# Patient Record
Sex: Male | Born: 1945 | Race: White | Hispanic: No | Marital: Single | Smoking: Never smoker
Health system: Southern US, Community
[De-identification: ages and names within clinical notes are randomized; demographics above are authoritative.]

## PROBLEM LIST (undated history)

## (undated) DIAGNOSIS — E669 Obesity, unspecified: Secondary | ICD-10-CM

## (undated) DIAGNOSIS — K219 Gastro-esophageal reflux disease without esophagitis: Secondary | ICD-10-CM

## (undated) DIAGNOSIS — N189 Chronic kidney disease, unspecified: Secondary | ICD-10-CM

## (undated) DIAGNOSIS — Z860100 Personal history of colon polyps, unspecified: Secondary | ICD-10-CM

## (undated) DIAGNOSIS — K5792 Diverticulitis of intestine, part unspecified, without perforation or abscess without bleeding: Secondary | ICD-10-CM

## (undated) DIAGNOSIS — I10 Essential (primary) hypertension: Secondary | ICD-10-CM

## (undated) DIAGNOSIS — I2510 Atherosclerotic heart disease of native coronary artery without angina pectoris: Secondary | ICD-10-CM

## (undated) DIAGNOSIS — N289 Disorder of kidney and ureter, unspecified: Secondary | ICD-10-CM

## (undated) DIAGNOSIS — K279 Peptic ulcer, site unspecified, unspecified as acute or chronic, without hemorrhage or perforation: Secondary | ICD-10-CM

## (undated) HISTORY — DX: Peptic ulcer, site unspecified, unspecified as acute or chronic, without hemorrhage or perforation: K27.9

## (undated) HISTORY — DX: Personal history of colonic polyps: Z86.010

## (undated) HISTORY — DX: Essential (primary) hypertension: I10

## (undated) HISTORY — DX: Personal history of colon polyps, unspecified: Z86.0100

## (undated) HISTORY — DX: Atherosclerotic heart disease of native coronary artery without angina pectoris: I25.10

## (undated) HISTORY — DX: Obesity, unspecified: E66.9

## (undated) HISTORY — DX: Diverticulitis of intestine, part unspecified, without perforation or abscess without bleeding: K57.92

## (undated) HISTORY — DX: Gastro-esophageal reflux disease without esophagitis: K21.9

## (undated) HISTORY — DX: Chronic kidney disease, unspecified: N18.9

## (undated) HISTORY — DX: Disorder of kidney and ureter, unspecified: N28.9

---

## 1998-02-22 ENCOUNTER — Other Ambulatory Visit: Admission: RE | Admit: 1998-02-22 | Discharge: 1998-02-22 | Payer: Self-pay | Admitting: Gastroenterology

## 1999-08-14 ENCOUNTER — Encounter: Payer: Self-pay | Admitting: Cardiothoracic Surgery

## 1999-08-15 ENCOUNTER — Encounter: Payer: Self-pay | Admitting: Cardiothoracic Surgery

## 1999-08-15 ENCOUNTER — Inpatient Hospital Stay (HOSPITAL_COMMUNITY): Admission: AD | Admit: 1999-08-15 | Discharge: 1999-08-19 | Payer: Self-pay | Admitting: Cardiology

## 1999-08-16 ENCOUNTER — Encounter: Payer: Self-pay | Admitting: Cardiothoracic Surgery

## 1999-08-16 HISTORY — PX: BYPASS GRAFT: SHX909

## 1999-08-17 ENCOUNTER — Encounter: Payer: Self-pay | Admitting: Cardiothoracic Surgery

## 1999-08-18 ENCOUNTER — Encounter: Payer: Self-pay | Admitting: Cardiothoracic Surgery

## 1999-11-06 ENCOUNTER — Encounter (HOSPITAL_COMMUNITY): Admission: RE | Admit: 1999-11-06 | Discharge: 2000-02-04 | Payer: Self-pay | Admitting: Cardiology

## 2003-08-18 ENCOUNTER — Ambulatory Visit (HOSPITAL_COMMUNITY): Admission: RE | Admit: 2003-08-18 | Discharge: 2003-08-18 | Payer: Self-pay | Admitting: Gastroenterology

## 2003-08-22 ENCOUNTER — Ambulatory Visit (HOSPITAL_COMMUNITY): Admission: RE | Admit: 2003-08-22 | Discharge: 2003-08-22 | Payer: Self-pay | Admitting: Family Medicine

## 2003-08-26 ENCOUNTER — Encounter: Admission: RE | Admit: 2003-08-26 | Discharge: 2003-08-26 | Payer: Self-pay | Admitting: Gastroenterology

## 2005-08-06 HISTORY — PX: CARDIAC CATHETERIZATION: SHX172

## 2005-08-21 ENCOUNTER — Encounter: Admission: RE | Admit: 2005-08-21 | Discharge: 2005-08-21 | Payer: Self-pay | Admitting: Family Medicine

## 2005-08-30 ENCOUNTER — Inpatient Hospital Stay (HOSPITAL_BASED_OUTPATIENT_CLINIC_OR_DEPARTMENT_OTHER): Admission: RE | Admit: 2005-08-30 | Discharge: 2005-08-30 | Payer: Self-pay | Admitting: Cardiology

## 2006-02-11 ENCOUNTER — Ambulatory Visit: Payer: Self-pay | Admitting: Family Medicine

## 2006-04-08 HISTORY — PX: NASAL ENDOSCOPY: SHX286

## 2006-12-11 ENCOUNTER — Ambulatory Visit: Payer: Self-pay | Admitting: Family Medicine

## 2006-12-15 ENCOUNTER — Ambulatory Visit (HOSPITAL_COMMUNITY): Admission: RE | Admit: 2006-12-15 | Discharge: 2006-12-15 | Payer: Self-pay | Admitting: Family Medicine

## 2006-12-23 ENCOUNTER — Ambulatory Visit: Payer: Self-pay | Admitting: Family Medicine

## 2007-01-01 ENCOUNTER — Encounter: Admission: RE | Admit: 2007-01-01 | Discharge: 2007-01-01 | Payer: Self-pay | Admitting: Gastroenterology

## 2007-01-09 ENCOUNTER — Ambulatory Visit (HOSPITAL_COMMUNITY): Admission: RE | Admit: 2007-01-09 | Discharge: 2007-01-09 | Payer: Self-pay | Admitting: Gastroenterology

## 2007-02-26 ENCOUNTER — Encounter: Admission: RE | Admit: 2007-02-26 | Discharge: 2007-02-26 | Payer: Self-pay | Admitting: General Surgery

## 2007-03-20 ENCOUNTER — Ambulatory Visit: Payer: Self-pay | Admitting: Family Medicine

## 2007-09-23 ENCOUNTER — Ambulatory Visit: Payer: Self-pay | Admitting: Family Medicine

## 2007-10-06 ENCOUNTER — Encounter: Admission: RE | Admit: 2007-10-06 | Discharge: 2007-10-06 | Payer: Self-pay | Admitting: Family Medicine

## 2007-10-06 ENCOUNTER — Ambulatory Visit: Payer: Self-pay | Admitting: Family Medicine

## 2007-10-14 ENCOUNTER — Ambulatory Visit: Payer: Self-pay | Admitting: Family Medicine

## 2007-12-31 ENCOUNTER — Ambulatory Visit: Payer: Self-pay | Admitting: Family Medicine

## 2008-02-19 ENCOUNTER — Ambulatory Visit: Payer: Self-pay | Admitting: Family Medicine

## 2008-08-31 ENCOUNTER — Encounter (INDEPENDENT_AMBULATORY_CARE_PROVIDER_SITE_OTHER): Payer: Self-pay | Admitting: *Deleted

## 2008-10-12 ENCOUNTER — Ambulatory Visit: Payer: Self-pay | Admitting: Family Medicine

## 2008-11-15 ENCOUNTER — Ambulatory Visit: Payer: Self-pay | Admitting: Family Medicine

## 2009-01-31 ENCOUNTER — Ambulatory Visit: Payer: Self-pay | Admitting: Family Medicine

## 2009-02-20 ENCOUNTER — Ambulatory Visit: Payer: Self-pay | Admitting: Family Medicine

## 2009-03-27 ENCOUNTER — Ambulatory Visit: Payer: Self-pay | Admitting: Family Medicine

## 2009-06-06 ENCOUNTER — Telehealth: Payer: Self-pay | Admitting: Gastroenterology

## 2010-04-30 ENCOUNTER — Encounter: Payer: Self-pay | Admitting: Family Medicine

## 2010-05-08 NOTE — Letter (Signed)
Summary: Recall Colonoscopy Letter  [facility]  [address] [place], [place] [address]   Phone: [phone]  Fax: [phone]      Aug 31, 2008 MRN: [id number]   ZESHAN SENA [address] [place], [place]  [address]   Dear Mr. Laba,   According to your medical record, it is time for you to schedule a Colonoscopy. The American Cancer Society recommends this procedure as a method to detect early colon cancer. Patients with a family history of colon cancer, or a personal history of colon polyps or inflammatory bowel disease are at increased risk.  This letter has beeen generated based on the recommendations made at the time of your procedure. If you feel that in your particular situation this may no longer apply, please contact our office.  Please call our office at [phone] to schedule this appointment or to update your records at your earliest convenience.  Thank you for cooperating with [place] to provide you with the very best care possible.   Sincerely,  Barbette Hair. Arlyce Dice, M.D.  [facility] [phone]

## 2010-05-08 NOTE — Progress Notes (Signed)
Summary: Schedule Colonoscopy  Phone Note Outgoing Call Call back at [facility] Phone [phone]   Call placed by: Harlow Mares CMA Duncan Dull),  June 06, 2009 4:45 PM Call placed to: Patient Summary of Call: Left message on patients machine to call back.  patient needs colonoscopy Initial call taken by: Harlow Mares CMA Duncan Dull),  June 06, 2009 4:46 PM  Follow-up for Phone Call        Left message on patients machine to call back.  Follow-up by: Harlow Mares CMA Duncan Dull),  June 15, 2009 2:54 PM

## 2010-06-12 ENCOUNTER — Encounter (INDEPENDENT_AMBULATORY_CARE_PROVIDER_SITE_OTHER): Payer: 59 | Admitting: Family Medicine

## 2010-06-12 DIAGNOSIS — E785 Hyperlipidemia, unspecified: Secondary | ICD-10-CM

## 2010-06-12 DIAGNOSIS — M109 Gout, unspecified: Secondary | ICD-10-CM

## 2010-06-12 DIAGNOSIS — I2510 Atherosclerotic heart disease of native coronary artery without angina pectoris: Secondary | ICD-10-CM

## 2010-06-12 DIAGNOSIS — Z79899 Other long term (current) drug therapy: Secondary | ICD-10-CM

## 2010-08-24 NOTE — H&P (Signed)
NAME:  Chase Wright, Chase Wright NO.:  [id number]   MEDICAL RECORD NO.:  [id number]           PATIENT TYPE:   LOCATION:                                 FACILITY:   PHYSICIAN:  Peter M. Swaziland, M.D.       DATE OF BIRTH:   DATE OF ADMISSION:  08/30/2005  DATE OF DISCHARGE:                                HISTORY & PHYSICAL   Chase Wright is a 65 year old white male with known history of coronary  artery disease.  He is status post coronary bypass surgery x5 in May 2001 by  Dr. Tyrone Sage.  At that time he had a LIMA graft placed to the LAD, saphenous  vein graft to an intermediate branch, saphenous vein graft to obtuse  marginal branch, and a sequential saphenous vein graft to the PDA and  posterolateral branches of the right coronary.  He has done very well from a  cardiac standpoint since then.  Approximately 2 months ago he began having  increased urinary frequency and nocturia.  He was placed on Flomax.  He also  had a change in his lipid therapy from Zocor to Vytorin since his LDL was  still proximally 161.  Over the past 2 weeks he has been having increasing  symptoms of right upper parasternal chest pain described as a heavy pressure  sensation.  There is no radiation, although he states that sometimes it  feels like it is coming out of his throat.  He has had associated shortness  of breath.  He denies any nausea, vomiting or diaphoresis.  He has not tried  taking any nitroglycerin.  He has been on chronic therapy for acid reflux  disease and states his pain is much different than his acid reflux symptoms,  and is more similar to his prior anginal attacks.  Given the fact that he is  now 6 years post bypass surgery and having ongoing increasing chest pain, it  is recommended he undergo diagnostic cardiac catheterization.   PAST MEDICAL HISTORY:  1.  Hypercholesterolemia.  2.  Hypertension.  3.  Gout.  4.  Obesity.  5.  History of renal calculi.  6.  Status post  T&A.  7.  Status post CABG x5 in 2001.   He is allergic to PENICILLIN.  He does have chronic renal insufficiency with  baseline creatinine 1.7.   His current medications include:  1.  Toprol-XL 200 mg per day.  2.  Altace 10 mg per day.  3.  Aspirin 325 mg per day.  4.  Vytorin 10/40 mg per day.  5.  Hydrochlorothiazide 25 mg per day.  6.  Allopurinol 300 mg per day.  7.  Flomax 0.4 mg daily.  8.  Norvasc 10 mg per day.  9.  Pepcid 20 mg b.i.d.   SOCIAL HISTORY:  The patient works at [employer].  He is single.  He  has no children.  He has 17 dogs that he cares for.  He denies tobacco use.  He does drink occasional gin and tonic.   FAMILY HISTORY:  His father died age  85 of prostate cancer.  Mother died at  age 75 of old age.  She did have diabetes.  He has no siblings.   REVIEW OF SYSTEMS:  He has had some increased lower extremity edema.  He has  had increased urinary frequency without dysuria.  He denies any recent  change in bowel or bladder habits.  He has had no history of TIA or stroke  and no bleeding disorder.  Other review of systems are negative.   PHYSICAL EXAMINATION:  GENERAL:  The patient is an obese white male in no  distress.  VITAL SIGNS:  His weight is 242.  His blood pressure is 140/88, pulse is 60  and regular.  HEENT:  Normocephalic, atraumatic.  Pupils are equal, round, and reactive.  Extraocular movements are full.  Oropharynx is clear.  NECK:  Supple without JVD, adenopathy, thyromegaly or bruits.  LUNGS:  Clear.  CARDIAC:  Reveals regular rate and rhythm without gallop, murmur, rub or  click.  His PMI is nondisplaced.  ABDOMEN:  Obese, soft, nontender, without masses or bruits.  Cannot elicit  any chest pain to palpation of the epigastric or chest wall.  EXTREMITIES:  Femoral and pedal pulses are 2+ and symmetric.  He has 1+  pitting edema.  He has no evidence of phlebitis.  NEUROLOGIC:  Exam is nonfocal.   LABORATORY DATA:  Resting ECG is  normal.  His chest x-ray showed mild  cardiomegaly with no active disease.  Most recent renal panel showed a BUN  35, creatinine 1.7.  Electrolytes were normal.   IMPRESSION:  1.  New onset of chest pain concerning for recurrent angina.  2.  Coronary disease status post coronary artery bypass grafting May 2001.  3.  Chronic renal sufficiency.  4.  Hypertension.  5.  Hypercholesterolemia.  6.  Gout.   PLAN:  The patient will hold his hydrochlorothiazide in preparation for his  catheterization.  Will pretreat him with Mucomyst to try and protect his  kidneys.  He will undergo diagnostic cardiac catheterization on Aug 30, 2005.  Further therapy pending these results.           ______________________________  Peter M. Swaziland, M.D.     PMJ/MEDQ  D:  08/28/2005  T:  08/28/2005  Job:  [id number]   cc:   Sharlot Gowda, M.D.  Fax: [phone]

## 2010-08-24 NOTE — Op Note (Signed)
[name]  Patient:    Chase Wright, Chase Wright                      MRN: [id number] Proc. Date: 08/15/99 Adm. Date:  [id number] Attending:  Waldo Laine CC:         Gwenith Daily. Tyrone Sage, M.D.             Peter M. Swaziland, M.D.                           Operative Report  PREOPERATIVE DIAGNOSIS:  Coronary occlusive disease with unstable angina.  POSTOPERATIVE DIAGNOSIS:  Coronary occlusive disease with unstable angina.  SURGICAL PROCEDURE:  Coronary artery bypass grafting x 5 with the left internal mammary to the left anterior descending coronary artery, sequential reverse saphenous vein grafts to the posterior descending and posterior lateral coronary arteries, reverse saphenous vein graft to the intermediate coronary artery, and a reverse saphenous vein graft to the first obtuse marginal coronary artery.  SURGEON:  Gwenith Daily. Tyrone Sage, M.D.  FIRST ASSISTANT:  Eugenia Pancoast, P.A.  BRIEF HISTORY:  The patient is a 65 year old male with long-standing, severe hypertension with blood pressures recently noted to be as high as 240/120.  He presented to Dr. Peter M. Swaziland with a two month history of progressive anginal symptoms with the recent onset of anginal chest pain at rest and with minimal activity.  Because of these symptoms, a cardiac catheterization was performed, which demonstrated 80% stenosis of the proximal LAD, 80% stenosis of the intermediate coronary artery, subtotal occlusion of a first obtuse marginal coronary artery, 80% stenosis of the right coronary artery, and 60-70% stenosis of the posterolateral branch of the right coronary artery. Overall, ventricular function was with ejection fraction of approximately 50%. Coronary artery bypass grafting was recommended to the patient, who agreed, and signed informed consent.  DESCRIPTION OF PROCEDURE:  With Swan-Ganz and arterial line monitors in place, the patient underwent general  endotracheal anesthesia without incidence.  The skin of the chest and legs were prepped with Betadine and draped in the usual sterile manner.  A vein was harvested from the right lower extremity and was of adequate quality and caliber.  A median sternotomy was performed.  The left internal mammary artery was dissected down as a pedicle graft.  The distal artery was divided.  It had good free flow.  The pericardium was opened.  The patient had evidence of severe left ventricular hypertrophy, probably based on long-standing hypertension.  He was systemically heparinized.  The ascending aorta and right atrium were cannulated, and the aortic root vent cardioplegia needle was introduced into the ascending aorta.  The patient was placed on cardiopulmonary bypass 2.4 L/min/sq m.  The sites of the anastomoses were selected and dissected out of the epicardium.  The patients body temperature was cooled to 30 degrees.  An aortic Cross clamp was applied, and 500 cc of cold blood potassium cardioplegia was administered with rapid diastolic arrest to the heart.  Myocardial septal temperature was monitored throughout the Cross clamp.  Attention was turned first to the obtuse marginal coronary artery, which was subtotally occluded at the time the film, was a reasonably sized vessel, identified, opened, and admitted a 1.5 mm probe.  Using a running 7-0 Prolene, a distal anastomosis was performed.  Attention was then turned to the right coronary system.  The posterior descending and posterolateral branches of the right  coronary artery were each opened.  Using a diamond-type, side-to-side, the anastomosis was carried out with a segment of reverse saphenous vein graft to the posterior descending coronary artery. The distal extent of the same vein was then anastomosed to the posterolateral branch.  Attention was then turned to the intermediate coronary artery, which was opened, and it admitted a 1.5 mm probe.   Using a running 7-0 Prolene, a distal anastomosis was performed with a segment of reverse saphenous vein graft.  Attention was then turned to the left anterior descending coronary artery, which was opened between the mid and distal third.  Using a running 8-0 Prolene, the left internal mammary artery was anastomosed the left anterior descending coronary artery.  With release of Edwards bulldog on the mammary artery, there was appropriate rise in myocardial septal temperature. The aortic Cross clamp was removed with a total Cross clamp time of 78 minutes.  A partial occlusion clamp was placed on the ascending aorta. Three punch aortotomies were performed.  Each of the three vein grafts were anastomosed to the ascending aorta.  Air was evacuated from the grafts, and the partial occlusion clamp was removed.  Sites of anastomoses were inspected and were free of bleeding.  The patient was then ventilated and weaned from cardiopulmonary bypass without difficulty.  He remained hemodynamically stable, and he was decannulated in the usual fashion.  Protamine sulfate was administered.  With the operative field homeostatic, two atrial and two ventricular pacing wires were applied.  Graft marks were applied, a left pleural tube, and two mediastinal tubes were left in place.  The sternum was closed with #6 stainless steel wire.  The fascia closed with interrupted #0 Vicryl, running 3-0 Vicryl in the subcutaneous tissue, and a 4-0 subcuticular stitch in the skin edges.  Dry dressings were applied. Sponge and needle counts were reported as correct at the completion of the procedure.  The patient tolerated the procedure without obvious complication and was transferred to the surgical intensive care unit for further postoperative care. DD:  08/17/99 TD:  08/19/99 Job: [id number]

## 2010-08-24 NOTE — Cardiovascular Report (Signed)
NAME:  Chase Wright, Chase Wright NO.:  [id number]   MEDICAL RECORD NO.:  [id number]          PATIENT TYPE:  OIB   LOCATION:  NA                           FACILITY:  [facility]   PHYSICIAN:  Peter M. Swaziland, M.D.  DATE OF BIRTH:  09/20/1945   DATE OF PROCEDURE:  08/30/2005  DATE OF DISCHARGE:                              CARDIAC CATHETERIZATION   INDICATION FOR PROCEDURE:  A 65 year old white male history of severe  hypertension, hypercholesterolemia, and known coronary disease.  He is  status post coronary artery bypass surgery in 2001.  He presents at this  time with increasing anginal symptoms.   PROCEDURES:  1.  Left heart catheterization.  2.  Coronary and left ventricular angiography.  3.  Saphenous vein graft angiography x3.  4.  Left internal mammary artery graft angiography.   ACCESS:  Via the right femoral artery using standard Seldinger technique.   EQUIPMENT USED:  A 4-French 4 cm right and left Judkins catheter, 4-French  pigtail catheter, 4-French arterial sheath.   CONTRAST:  100 mL of Omnipaque.   MEDICATIONS:  Local anesthesia 1% Xylocaine, Versed 2 mg IV.   HEMODYNAMIC DATA:  Aortic pressure is 103/61 with a mean of 80 mmHg.  Left  ventricle pressure 103 with an EDP of 22 mmHg.   ANGIOGRAPHIC DATA:  The left coronary arises and distributes normally.  The  left main coronary artery is without significant disease.   The left anterior descending artery is occluded proximally.   The intermediate branch is occluded proximally.   The left circumflex gives rise to a single marginal branch which is occluded  proximally.   The right coronary artery arises and distributes normally.  It is a dominant  vessel.  Has a 70% stenosis in the proximal vessel, is diffusely diseased up  to 70% in the distal vessel.  Posterolateral branch is occluded.   The saphenous vein graft sequentially to the PDA and posterolateral branches  of the right coronary artery is  widely patent with good runoff.   The saphenous vein graft to the intermediate branch is widely patent with  good runoff.   The saphenous vein graft to the first obtuse marginal vessel is widely  patent with good runoff.   The LIMA graft to the LAD is a large graft and widely patent with good  runoff.  There is an 80-90% stenosis in the distal LAD.   Left ventricular angiography performed in the RAO view demonstrates normal  left ventricular size and contractility with normal systolic function.  Ejection fraction is estimated at 65%.  There is no mitral regurgitation or  prolapse.   FINAL INTERPRETATION:  1.  Severe three-vessel obstructive atherosclerotic coronary artery disease.  2.  All grafts are still widely patent including left internal mammary      artery graft to the left anterior descending, saphenous vein graft to      the intermediate, saphenous vein graft to the obtuse marginal branch,      saphenous vein graft sequentially to the posterior descending artery and      posterolateral branches of the right  coronary artery.  3.  Normal left ventricular function.   PLAN:  Patient does have potential areas of ischemia in the distal LAD  territory.  I would recommend medical therapy.           ______________________________  Peter M. Swaziland, M.D.     PMJ/MEDQ  D:  08/30/2005  T:  08/30/2005  Job:  [id number]   cc:   Sharlot Gowda, M.D.  Fax: [phone]

## 2010-08-24 NOTE — Consult Note (Signed)
[place]. [facility]  Patient:    Chase Wright, Chase Wright                      MRN: [id number] Adm. Date:  [id number] Attending:  [place], Peter Manning CC:         Gwenith Daily. Tyrone Sage, M.D., Cardiovascular Surgery                          Consultation Report  REASON FOR CONSULTATION:  Three vessel coronary occlusive disease with unstable  angina.  Consult requested by Dr. Swaziland.  HISTORY OF PRESENT ILLNESS: Patient is a 65 year old white male with severe hypertension for many years and also known hyperlipidemia who presented with history of crescendo angina.  He notes over the past two to three months, he has had increasing episodes of exertionally related chest pain.  He notes dull, aching mid-sternal chest pain without radiation, extreme pressure.  Denies any pain or  numbness in the arms.  Over the past several weeks, the symptoms have been coming on at much lower levels of activity.  In fact, on the day of admission, had an episode of rest pain.   He denies any shortness of breath or radiation.  He has had no prior history of myocardial infarction.  PAST MEDICAL HISTORY: 1. Kidney stones. 2. History of peptic ulcer disease. 3. History of tonsillectomy.  ALLERGIES:  PENICILLIN.  CURRENT MEDICATIONS: 1. Diovan 160 q. day. 2. Hydrochlorothiazide 12.5 mg a day. 3. Toprol XL 50 mg a day. 4. Nitroglycerin p.r.n. 5. One aspirin a day.  SOCIAL HISTORY:  The patient is currently unemployed.  He is single and has no children.  Denies any alcohol use.  Drinks an occasional beer.  FAMILY HISTORY:  Patients father died at age 63 of prostate cancer.  Patients mother died at age 77.  She was an known diabetic.  REVIEW OF SYSTEMS:  Patient has no constitutional symptoms.  Denies any weight loss, fever, or chills.  Denies any TIAs or stroke.  Denies any hematuria. Denies any hematochezia.  Denies claudication.  He does note edema.  He has had edema n the  lower extremities at time.  Patient does give history of significant hypertension, as high as 240/120 in the recent past and notes many years of tensive control without success.  PHYSICAL EXAMINATION:  On exam, patient is in no distress with IV heparin and nitroglycerin drip.  Vital signs: Weight 232.0 pounds, blood pressure 150/80.  HEENT:  Pupils are equal, round and reactive to light.  Patient has no jugular venous distention.  No cervical adenopathy.  No thyromegaly or bruits.  LUNGS:  Clear to auscultation and percussion.  HEART:  Normal S1, S2, without murmurs or gallops.  There is no click.  PMI is ot displaced.  ABDOMEN:  Obese abdomen without palpable masses or hepatosplenomegaly.  The aorta is not palpably enlarged, though because of his size, would be difficult to palpate.  EXTREMITIES:  2+ DP pulses bilaterally, no edema.  Appears to have adequate vein in both lower extremities for bypass.  Cardiac catheterization was performed today by Dr. Swaziland, which demonstrates significant three vessel disease.  Left main is okay.  LAD has a 95% sequential  proximal lesion.  The first obtuse marginal is totally occluded with collateral  filling, but it is difficult to ascertain the quality of this vessel.  It appears very small.  Right coronary artery has a mid  80-90% lesion.  Overall ejection fraction 65% and diastolic pressure is elevated at 25.  With the patients unstable anginal symptoms, including rest chest pain with three vessel coronary artery disease, I agree with recommendation for coronary artery  bypass grafting.  The procedure has been discussed the patient in detail, including the risk of death, infection, stroke, myocardial infarction, bleeding, blood transfusion.  His questions have been answered and he is willing to proceed with bypass surgery as recommended. DD:  08/14/99 TD:  08/14/99 Job: [id number]

## 2010-08-24 NOTE — Cardiovascular Report (Signed)
[place]. [facility]  Patient:    Chase Wright, Chase Wright                      MRN: [id number] Proc. Date: 08/14/99 Adm. Date:  [id number] Attending:  Waldo Laine CC:         Ronnald Nian, M.D.             CVTS                        Cardiac Catheterization  PROCEDURE:  INDICATION:  The patient is a 65 year old white male with a history of hypertension, hyperlipidemia, who presents with unstable anginal symptoms.  ACCESS:  Access is via the right femoral artery using standard Seldinger technique.  EQUIPMENT:  A 6 French 4 cm right and left Judkins catheter.  A 6 French pigtail catheter.  A 6 French arterial sheath.  PREMEDICATION:  Versed 2 mg IV.  Local anesthesia with 1% Xylocaine.  Contrast s 140 cc of Omnipaque.  HEMODYNAMIC DATA: 1. Aortic pressure is 150/90 with a mean of 118. 2. Left ventricular is 152 with an EDP of 25 mmHg.  ANGIOGRAPHIC DATA: 1. Left coronary artery rises and distributes normally. 2. Left main coronary artery is normal. 3. Left anterior descending artery is diffusely diseased to the segmental fascia in    the mid-vessel following a large first diagonal branch.  Within this LAD    segment, there are two sequential 95% stenoses.  There is also a 95% stenosis in    the distal LAD.  The distal LAD at the apex is well supplied by collaterals rom    the right coronary artery. 4. The first diagonal branch is diffusely diseased proximally up to 40% with a    focal 70% stenosis in the mid-vessel. 5. The left circumflex coronary artery gives rise to a single marginal vessel which    is occluded proximally.  It fills by left to left collaterals. 6. The right coronary artery rises and distributes normally.  It has a focal 80%    stenosis in the mid-vessel.  It gives rise to three distal branches.  The middle    of these branches has an 80% stenosis proximally. 7. Left ventricular angiography is performed in the RAO  view.  This demonstrates    normal left ventricular size and contractility with normal systolic function.    Ejection fraction is estimated at 65%.  There is no mitral valve prolapse or    regurgitation.  FINAL INTERPRETATION: 1. Severe three vessel obstructive atherosclerotic coronary artery disease. 2. Normal left ventricular function.  RECOMMENDATIONS:  Recommend coronary artery bypass surgery. DD:  08/14/99 TD:  08/15/99 Job: [id number]

## 2010-08-24 NOTE — Discharge Summary (Signed)
[place]. [facility]  Patient:    Chase Wright, Chase Wright                      MRN: [id number] Adm. Date:  [id number] Disc. Date: 08/19/99 Attending:  Waldo Laine Dictator:   Sherrie George, P.A. CC:         Gwenith Daily. Tyrone Sage, M.D.             Ronnald Nian, M.D.             Peter M. Swaziland, M.D.                           Discharge Summary  DATE OF BIRTH:  [birth year]  ADMISSION DIAGNOSES: 1. Crescendo angina. 2. Severe hypertension. 3. Hyperlipidemia. 4. Obesity.  DISCHARGE DIAGNOSES: 1. Severe three-vessel coronary artery disease with a 95% left anterior    descending artery, 40 to 70% first diagonal, total occlusion of the left    circumflex, 80% stenosis of the right coronary artery with ejection    fraction of 65%. 2. Severe hypertension. 3. Hyperlipidemia. 4. Obesity.  PROCEDURES: 1. Cardiac catheterization Aug 14, 1999, Dr. Swaziland. 2. Coronary artery bypass grafting x 5 with left internal mammary to the    LAD, saphenous vein graft to the diagonal and first obtuse marginal    sequentially, saphenous vein graft to the intermediate, saphenous vein    graft to the posterior descending and posterolateral coronary arteries    Aug 15, 1999, Dr. Tyrone Sage.  BRIEF HISTORY:  The patient is a 65 year old white male medical patient of Dr. Sharlot Gowda referred to Dr. Swaziland with crescendo-type angina.  He stated that for the past two months, he has had recurring episode of exertional chest pain described as mid sternal and feel like extreme pressure.  It is relieved with rest and if he takes some aspirin.  More recently, his symptoms have been getting worse with a lower level of activity required to initiate the discomfort.  He is admitted at this time for evaluation and cardiac catheterization.  PAST MEDICAL HISTORY:  Significant for kidney stones.  He has a history of peptic ulcer disease, hypertension, and a previous  tonsillectomy.  ALLERGIES:  PENICILLIN.  CURRENT MEDICATIONS: 1. Diovan 160 mg q.d. 2. Hydrochlorothiazide 12.5 mg q.d. 3. Toprol XL 50 mg q.d. 4. Nitroglycerin p.r.n. 5. Aspirin 325 mg 1 q.d.  SOCIAL HISTORY:  He is currently unemployed.  He does not exercise regularly. He is single with no children.  He denies tobacco use.  He drinks an occasional beer.  For further History and Physical, please see the dictated note.  HOSPITAL COURSE:  The patient was admitted and underwent cardiac catheterization with the above-noted results.  The left main was normal.  The LAD had segmental disease and 95% lesion.   The left circumflex was totally occluded.  The right coronary artery had an 80 to 90% mid RCA stenosis and 80% proximal posterolateral branch.  LV function was estimated at 65%.  After reviewing the studies, he was seen in consultation by Dr. Tyrone Sage.  He reviewed the films and agreed the patient was a candidate for coronary artery bypass grafting.  The risks and benefits were discussed in detail, and informed consent was obtained.  Preoperative studies were normal.  He was taken to the operating room on Aug 15, 1999, and underwent the procedure described above.  He tolerated the procedure  well.  He came off bypass and transferred to the ICU.  On the first postoperative morning, he was neurologically intact and awake. Potassium was slightly elevated.  He had poor blood pressure control, but it was overall stable.  He was transferred to the floor on the second postoperative day.  Blood pressure was better at 130/60.  Hematocrit was 29. His wounds looked good.  On transfer to 2000, he was started on progressive ambulation.  He has made excellent improvement, and by Aug 18, 1999, it was Dr. Ysidro Evert opinion that he would be ready for discharge on Aug 19, 1999. Currently, his wounds are all healing nicely.  His sternum is stable.  Chest is clear. Wounds are healing nicely.  We will  remove his pacing wires in the a.m. prior to discharge along with his chest tube sutures.  All his sutures will be subcuticular, and he will not have staple removal to be concerned with.  DISCHARGE MEDICATIONS: 1. Toprol XL 50 mg 1 p.o. b.i.d. 2. Potassium chloride 20 mEq 1 q.d. x 10 days. 3. Lasix 40 mg 1 p.o. q.d. x 10 days. 4. Coated aspirin 325 mg 1 q.d. 5. Altace 5 mg 1 q.d. 6. Tylox 1 to 2 q.4h. p.r.n.  DISCHARGE ACTIVITY:  Light to moderate.  No lifting over 10 pounds, no driving, no strenuous activity.  FOLLOWUP:  He will return to see Dr. Swaziland in two weeks with a chest x-ray in his office, to be seen by Dr. Tyrone Sage in three week with the chest x-ray from Dr. Illa Level office.  CONDITION ON DISCHARGE:  Improving. DD:  08/18/99 TD:  08/18/99 Job: [id number]

## 2011-01-24 ENCOUNTER — Other Ambulatory Visit: Payer: Self-pay | Admitting: Family Medicine

## 2011-01-24 NOTE — Telephone Encounter (Signed)
Patient needs a office visit. 

## 2011-02-18 ENCOUNTER — Encounter: Payer: Self-pay | Admitting: Family Medicine

## 2011-02-19 ENCOUNTER — Encounter: Payer: Self-pay | Admitting: Family Medicine

## 2011-02-19 ENCOUNTER — Ambulatory Visit (INDEPENDENT_AMBULATORY_CARE_PROVIDER_SITE_OTHER): Payer: 59 | Admitting: Family Medicine

## 2011-02-19 DIAGNOSIS — E785 Hyperlipidemia, unspecified: Secondary | ICD-10-CM | POA: Insufficient documentation

## 2011-02-19 DIAGNOSIS — Z888 Allergy status to other drugs, medicaments and biological substances status: Secondary | ICD-10-CM

## 2011-02-19 DIAGNOSIS — H109 Unspecified conjunctivitis: Secondary | ICD-10-CM

## 2011-02-19 DIAGNOSIS — E7800 Pure hypercholesterolemia, unspecified: Secondary | ICD-10-CM

## 2011-02-19 DIAGNOSIS — Z789 Other specified health status: Secondary | ICD-10-CM | POA: Insufficient documentation

## 2011-02-19 NOTE — Progress Notes (Signed)
  Subjective:    Patient ID: Chase Wright, male    DOB: 07-12-1945, 65 y.o.   MRN: [id number]  HPI He is here for evaluation of redness in his left eye. He has had no drainage, pain, itchy watery eyes. This has been bothering him for 2 weeks. He has tried Visine which has helped temporarily . He also is here to discuss his overall health and medications. He does have a history of statin intolerance. His diet and exercise pattern has not really changed.   Review of Systems     Objective:   Physical Exam Alert and in no distress. The left conjunctiva does show injection laterally only. Anterior chamber and cornea normal. Right eye exam normal.      Assessment & Plan:   1. Conjunctivitis  Ambulatory referral to Ophthalmology  2. Hypercholesterolemia    3. Statin intolerance     I will see if he qualifies for research study for his hyper lipidemia

## 2011-05-04 ENCOUNTER — Other Ambulatory Visit: Payer: Self-pay | Admitting: Family Medicine

## 2011-08-06 ENCOUNTER — Other Ambulatory Visit: Payer: Self-pay | Admitting: Family Medicine

## 2011-10-22 ENCOUNTER — Encounter: Payer: Self-pay | Admitting: Gastroenterology

## 2011-11-11 ENCOUNTER — Other Ambulatory Visit: Payer: Self-pay | Admitting: Family Medicine

## 2012-02-16 ENCOUNTER — Other Ambulatory Visit: Payer: Self-pay | Admitting: Family Medicine

## 2012-02-18 ENCOUNTER — Telehealth: Payer: Self-pay | Admitting: Internal Medicine

## 2012-02-18 MED ORDER — METOPROLOL SUCCINATE ER 50 MG PO TB24: 50.0000 mg | ORAL_TABLET | Freq: Every day | ORAL | Status: DC

## 2012-02-18 MED ORDER — AMLODIPINE BESYLATE 5 MG PO TABS: 5.0000 mg | ORAL_TABLET | Freq: Every day | ORAL | Status: DC

## 2012-02-18 MED ORDER — HYDROCHLOROTHIAZIDE 12.5 MG PO CAPS: 12.5000 mg | ORAL_CAPSULE | Freq: Every day | ORAL | Status: DC

## 2012-02-18 MED ORDER — RAMIPRIL 10 MG PO CAPS: 10.0000 mg | ORAL_CAPSULE | Freq: Every day | ORAL | Status: DC

## 2012-02-18 NOTE — Telephone Encounter (Signed)
PT IS COMING IN NEXT WEEK FOR A MED CHECK AND HAS BEEN OUT OF MED FOR 2 DAYS. SENT IN A 90 DAY SUPPLY FOR PT SINCE INSURANCE WILL ONLY PAY FOR 90 DAYS. NO REFILLS

## 2012-02-25 ENCOUNTER — Ambulatory Visit (INDEPENDENT_AMBULATORY_CARE_PROVIDER_SITE_OTHER): Payer: 59 | Admitting: Family Medicine

## 2012-02-25 ENCOUNTER — Encounter: Payer: Self-pay | Admitting: Family Medicine

## 2012-02-25 VITALS — BP 120/72 | HR 55 | Ht 63.0 in | Wt 215.0 lb

## 2012-02-25 DIAGNOSIS — Z860100 Personal history of colon polyps, unspecified: Secondary | ICD-10-CM

## 2012-02-25 DIAGNOSIS — Z23 Encounter for immunization: Secondary | ICD-10-CM

## 2012-02-25 DIAGNOSIS — K219 Gastro-esophageal reflux disease without esophagitis: Secondary | ICD-10-CM

## 2012-02-25 DIAGNOSIS — Z888 Allergy status to other drugs, medicaments and biological substances status: Secondary | ICD-10-CM

## 2012-02-25 DIAGNOSIS — N289 Disorder of kidney and ureter, unspecified: Secondary | ICD-10-CM

## 2012-02-25 DIAGNOSIS — Z8739 Personal history of other diseases of the musculoskeletal system and connective tissue: Secondary | ICD-10-CM | POA: Insufficient documentation

## 2012-02-25 DIAGNOSIS — I2510 Atherosclerotic heart disease of native coronary artery without angina pectoris: Secondary | ICD-10-CM

## 2012-02-25 DIAGNOSIS — L989 Disorder of the skin and subcutaneous tissue, unspecified: Secondary | ICD-10-CM

## 2012-02-25 DIAGNOSIS — E7800 Pure hypercholesterolemia, unspecified: Secondary | ICD-10-CM

## 2012-02-25 DIAGNOSIS — M109 Gout, unspecified: Secondary | ICD-10-CM

## 2012-02-25 DIAGNOSIS — I10 Essential (primary) hypertension: Secondary | ICD-10-CM

## 2012-02-25 DIAGNOSIS — Z789 Other specified health status: Secondary | ICD-10-CM

## 2012-02-25 DIAGNOSIS — Z79899 Other long term (current) drug therapy: Secondary | ICD-10-CM

## 2012-02-25 MED ORDER — METOPROLOL SUCCINATE ER 50 MG PO TB24: 50.0000 mg | ORAL_TABLET | Freq: Every day | ORAL | Status: DC

## 2012-02-25 MED ORDER — ALLOPURINOL 300 MG PO TABS: 300.0000 mg | ORAL_TABLET | Freq: Every day | ORAL | Status: DC

## 2012-02-25 MED ORDER — RAMIPRIL 10 MG PO CAPS: 10.0000 mg | ORAL_CAPSULE | Freq: Every day | ORAL | Status: DC

## 2012-02-25 MED ORDER — AMLODIPINE BESYLATE 5 MG PO TABS: 5.0000 mg | ORAL_TABLET | Freq: Every day | ORAL | Status: DC

## 2012-02-25 NOTE — Progress Notes (Signed)
  Subjective:    Patient ID: Chase Wright, male    DOB: 1945/12/05, 66 y.o.   MRN: [id number]  HPI  he is here for a medication check. He has enjoyed excellent health and has not come in in the past he has had difficulty with prostate problems but none recently. He does have statin intolerance. He has a previous history of ASHD but has not had any chest pain, shortness of breath, PND or weakness. His reflux has not caused him any trouble. He has not had any gout attacks. Review his record indicates he has had colonic polyps. He continues to work however his getting stressed because of this. His social and family history were reviewed. They're unchanged. He does have a lesion present on his left leg he would like me to look at.   Review of Systems     Objective:   Physical Exam alert and in no distress. Tympanic membranes and canals are normal. Throat is clear. Tonsils are normal. Neck is supple without adenopathy or thyromegaly. Cardiac exam shows a regular sinus rhythm without murmurs or gallops. Lungs are clear to auscultation. Exam of the left lower leg does show 2 healing lesions with a brownish 1 cm lesion just lateral to that. DTRs are normal.  EKG shows no acute changes.       Assessment & Plan:   1. Hypercholesterolemia  Lipid panel  2. Statin intolerance    3. Hypertension  CBC with Differential, Comprehensive metabolic panel, ramipril (ALTACE) 10 MG capsule, metoprolol succinate (TOPROL-XL) 50 MG 24 hr tablet, amLODipine (NORVASC) 5 MG tablet  4. Renal insufficiency  CBC with Differential  5. GERD (gastroesophageal reflux disease)    6. ASHD (arteriosclerotic heart disease)  CBC with Differential, Comprehensive metabolic panel, PSA, PR ELECTROCARDIOGRAM, COMPLETE, ramipril (ALTACE) 10 MG capsule, metoprolol succinate (TOPROL-XL) 50 MG 24 hr tablet  7. Gout  allopurinol (ZYLOPRIM) 300 MG tablet  8. History of colonic polyps    9. Encounter for long-term (current) use of other  medications  Lipid panel, CBC with Differential, Comprehensive metabolic panel, PSA, Pneumococcal polysaccharide vaccine 23-valent greater than or equal to 2yo subcutaneous/IM  10. Skin lesion of left leg     immunizations were updated. Discussion of risks and benefits described. He is to return for a punch biopsy of the leg lesion. Discussed stress and stress management with him.

## 2012-02-26 LAB — COMPREHENSIVE METABOLIC PANEL
ALT: 14 U/L (ref 0–53)
AST: 17 U/L (ref 0–37)
Albumin: 4.4 g/dL (ref 3.5–5.2)
Alkaline Phosphatase: 50 U/L (ref 39–117)
BUN: 30 mg/dL — ABNORMAL HIGH (ref 6–23)
CO2: 25 mEq/L (ref 19–32)
Calcium: 9.5 mg/dL (ref 8.4–10.5)
Chloride: 102 mEq/L (ref 96–112)
Creat: 1.47 mg/dL — ABNORMAL HIGH (ref 0.50–1.35)
Glucose, Bld: 84 mg/dL (ref 70–99)
Potassium: 5.2 mEq/L (ref 3.5–5.3)
Sodium: 135 mEq/L (ref 135–145)
Total Bilirubin: 0.8 mg/dL (ref 0.3–1.2)
Total Protein: 7.2 g/dL (ref 6.0–8.3)

## 2012-02-26 LAB — CBC WITH DIFFERENTIAL/PLATELET
Basophils Absolute: 0 10*3/uL (ref 0.0–0.1)
Basophils Relative: 1 % (ref 0–1)
Eosinophils Absolute: 0.5 10*3/uL (ref 0.0–0.7)
Eosinophils Relative: 6 % — ABNORMAL HIGH (ref 0–5)
HCT: 48 % (ref 39.0–52.0)
Hemoglobin: 16 g/dL (ref 13.0–17.0)
Lymphocytes Relative: 32 % (ref 12–46)
Lymphs Abs: 2.7 10*3/uL (ref 0.7–4.0)
MCH: 32 pg (ref 26.0–34.0)
MCHC: 33.3 g/dL (ref 30.0–36.0)
MCV: 96 fL (ref 78.0–100.0)
Monocytes Absolute: 0.7 10*3/uL (ref 0.1–1.0)
Monocytes Relative: 9 % (ref 3–12)
Neutro Abs: 4.5 10*3/uL (ref 1.7–7.7)
Neutrophils Relative %: 52 % (ref 43–77)
Platelets: 293 10*3/uL (ref 150–400)
RBC: 5 MIL/uL (ref 4.22–5.81)
RDW: 14.2 % (ref 11.5–15.5)
WBC: 8.4 10*3/uL (ref 4.0–10.5)

## 2012-02-26 LAB — PSA: PSA: 2.62 ng/mL (ref <=4.00)

## 2012-02-26 LAB — LIPID PANEL
Cholesterol: 227 mg/dL — ABNORMAL HIGH (ref 0–200)
HDL: 38 mg/dL — ABNORMAL LOW (ref >39)
LDL Cholesterol: 165 mg/dL — ABNORMAL HIGH (ref 0–99)
Total CHOL/HDL Ratio: 6 Ratio
Triglycerides: 120 mg/dL (ref <150)
VLDL: 24 mg/dL (ref 0–40)

## 2012-02-26 NOTE — Progress Notes (Signed)
Quick Note:  Called and left message on pt home # psa 2.62 lipids elevated and encouraged a better diet and he could see labs in about 4 days on MY CHART ______

## 2012-03-04 ENCOUNTER — Ambulatory Visit (INDEPENDENT_AMBULATORY_CARE_PROVIDER_SITE_OTHER): Payer: 59 | Admitting: Family Medicine

## 2012-03-04 ENCOUNTER — Other Ambulatory Visit: Payer: Self-pay | Admitting: Family Medicine

## 2012-03-04 VITALS — BP 126/80 | HR 66 | Wt 215.0 lb

## 2012-03-04 DIAGNOSIS — L989 Disorder of the skin and subcutaneous tissue, unspecified: Secondary | ICD-10-CM

## 2012-03-04 NOTE — Progress Notes (Signed)
  Subjective:    Patient ID: Chase Wright, male    DOB: 03-Jul-1945, 66 y.o.   MRN: [id number]  HPI He is here for a punch biopsy of lesion present on the left lateral ankle.  Review of Systems     Objective:   Physical Exam Pigmented round 2 cm poorly defined margin is noted in the left ankle proximal to the lateral malleolus.       Assessment & Plan:   1. Leg lesion    the lesion was injected with Xylocaine and epinephrine. A 2 mm punch biopsy was done without difficulty. The lesion was covered with a Band-Aid.

## 2012-03-09 NOTE — Progress Notes (Signed)
Quick Note:  CALLED PT HOME/CELL # LEFT MESSAGE THAT BX IS BENIGN ______

## 2012-03-09 NOTE — Progress Notes (Signed)
Quick Note:  Let patient know that that lesion is benign ______

## 2012-05-20 ENCOUNTER — Other Ambulatory Visit: Payer: Self-pay | Admitting: Family Medicine

## 2012-07-15 ENCOUNTER — Ambulatory Visit: Payer: 59 | Admitting: *Deleted

## 2012-07-15 DIAGNOSIS — R001 Bradycardia, unspecified: Secondary | ICD-10-CM

## 2012-07-15 DIAGNOSIS — Z79899 Other long term (current) drug therapy: Secondary | ICD-10-CM

## 2012-07-15 NOTE — Progress Notes (Signed)
Pt in for an EKG to be done for [facility]. According to pt he is  on a research study. EKG done per nurse and read per Dr. Johney Frame EP cardiologist. Pt is in Sinus bradycardia rate 53 beats/minute, BP 94/63. Pt is doing well no C/O offered. I spoke with Alcario Drought, the EKG is to  be fax to [phone] att: research. EKG faxed.

## 2012-08-23 ENCOUNTER — Other Ambulatory Visit: Payer: Self-pay | Admitting: Family Medicine

## 2012-08-26 ENCOUNTER — Other Ambulatory Visit: Payer: Self-pay | Admitting: Family Medicine

## 2012-10-27 ENCOUNTER — Encounter: Payer: Self-pay | Admitting: Cardiology

## 2012-10-27 ENCOUNTER — Ambulatory Visit: Payer: Self-pay | Admitting: *Deleted

## 2012-10-27 DIAGNOSIS — Z006 Encounter for examination for normal comparison and control in clinical research program: Secondary | ICD-10-CM

## 2012-10-28 NOTE — Progress Notes (Signed)
EKG done 10/27/12 for [facility].

## 2013-02-23 ENCOUNTER — Ambulatory Visit (INDEPENDENT_AMBULATORY_CARE_PROVIDER_SITE_OTHER): Payer: 59 | Admitting: Family Medicine

## 2013-02-23 ENCOUNTER — Encounter: Payer: Self-pay | Admitting: Family Medicine

## 2013-02-23 VITALS — BP 122/80 | HR 48 | Wt 228.0 lb

## 2013-02-23 DIAGNOSIS — Z86010 Personal history of colonic polyps: Secondary | ICD-10-CM

## 2013-02-23 DIAGNOSIS — N289 Disorder of kidney and ureter, unspecified: Secondary | ICD-10-CM

## 2013-02-23 DIAGNOSIS — E7800 Pure hypercholesterolemia, unspecified: Secondary | ICD-10-CM

## 2013-02-23 DIAGNOSIS — M109 Gout, unspecified: Secondary | ICD-10-CM

## 2013-02-23 DIAGNOSIS — K219 Gastro-esophageal reflux disease without esophagitis: Secondary | ICD-10-CM

## 2013-02-23 DIAGNOSIS — I2510 Atherosclerotic heart disease of native coronary artery without angina pectoris: Secondary | ICD-10-CM

## 2013-02-23 DIAGNOSIS — Z789 Other specified health status: Secondary | ICD-10-CM

## 2013-02-23 DIAGNOSIS — I10 Essential (primary) hypertension: Secondary | ICD-10-CM

## 2013-02-23 LAB — COMPREHENSIVE METABOLIC PANEL
ALT: 13 U/L (ref 0–53)
AST: 18 U/L (ref 0–37)
Albumin: 4.1 g/dL (ref 3.5–5.2)
Alkaline Phosphatase: 60 U/L (ref 39–117)
BUN: 24 mg/dL — ABNORMAL HIGH (ref 6–23)
CO2: 31 mEq/L (ref 19–32)
Calcium: 9.4 mg/dL (ref 8.4–10.5)
Chloride: 99 mEq/L (ref 96–112)
Creat: 1.45 mg/dL — ABNORMAL HIGH (ref 0.50–1.35)
Glucose, Bld: 82 mg/dL (ref 70–99)
Potassium: 4.2 mEq/L (ref 3.5–5.3)
Sodium: 140 mEq/L (ref 135–145)
Total Bilirubin: 0.6 mg/dL (ref 0.3–1.2)
Total Protein: 7.2 g/dL (ref 6.0–8.3)

## 2013-02-23 LAB — CBC WITH DIFFERENTIAL/PLATELET
Basophils Absolute: 0 10*3/uL (ref 0.0–0.1)
Basophils Relative: 1 % (ref 0–1)
Eosinophils Absolute: 0.4 10*3/uL (ref 0.0–0.7)
Eosinophils Relative: 6 % — ABNORMAL HIGH (ref 0–5)
HCT: 45.7 % (ref 39.0–52.0)
Hemoglobin: 15.8 g/dL (ref 13.0–17.0)
Lymphocytes Relative: 31 % (ref 12–46)
Lymphs Abs: 2.3 10*3/uL (ref 0.7–4.0)
MCH: 33.1 pg (ref 26.0–34.0)
MCHC: 34.6 g/dL (ref 30.0–36.0)
MCV: 95.6 fL (ref 78.0–100.0)
Monocytes Absolute: 0.6 10*3/uL (ref 0.1–1.0)
Monocytes Relative: 8 % (ref 3–12)
Neutro Abs: 4.1 10*3/uL (ref 1.7–7.7)
Neutrophils Relative %: 54 % (ref 43–77)
Platelets: 260 10*3/uL (ref 150–400)
RBC: 4.78 MIL/uL (ref 4.22–5.81)
RDW: 13.9 % (ref 11.5–15.5)
WBC: 7.5 10*3/uL (ref 4.0–10.5)

## 2013-02-23 LAB — LIPID PANEL
Cholesterol: 209 mg/dL — ABNORMAL HIGH (ref 0–200)
HDL: 41 mg/dL (ref >39)
LDL Cholesterol: 143 mg/dL — ABNORMAL HIGH (ref 0–99)
Total CHOL/HDL Ratio: 5.1 Ratio
Triglycerides: 126 mg/dL (ref <150)
VLDL: 25 mg/dL (ref 0–40)

## 2013-02-23 NOTE — Progress Notes (Signed)
oooo ?

## 2013-02-23 NOTE — Progress Notes (Signed)
Teaching Physician: Sharlot Gowda, MD  Dictated By: Judithann Graves   Subjective:   Chase Wright is a 67 y.o. male who presents for an interval medication check as well as evaluation of 1 year of pain felt in bilateral anterior lower extremity. He feels this pain after being on his feet all day at work. It is not produced by exertion per se and it is not relieved by rest. BC powder does alleviate his lower extremity achy pain. He has no joint swelling or pain, no rashes. He notices swelling in his right lower extremity but relates a history of saphenous vein harvest for CABG in 2001. He has had no fever. He has no paresthesias, no lower extremity motor weakness.   He has a history of atherosclerotic heart disease and he describes mild occasional chest pain that he thinks is related to stress at his job. He has no shortness of breath or dyspnea on exertion. He was last seen by cardiology over 1 year ago - they are no longer following. His blood pressure is well managed per patient with metoprolol, amlodipine, and HCTZ and Ramipril and he has no syncopal or orthostatic episodes. His GERD is well controlled with ranitidine with no heartburn, reflux, or cough. He has had no recent gouty flares and continues to take his allopurinol. He has a history of renal insufficiency but notices no change in his daily urine output and has only nocturia x1 each night - he continues taking his desmopressin (he is involved in a research protocol). Family and social histories were reviewed.   ROS as in subjective.   Medications:  Prior to Admission medications   Medication  Sig  Start Date  End Date  Taking?  Authorizing Provider   alfuzosin (UROXATRAL) 10 MG 24 hr tablet  Take 10 mg by mouth daily.    Yes  Historical Provider, MD   allopurinol (ZYLOPRIM) 300 MG tablet  Take 1 tablet (300 mg total) by mouth daily.  02/25/12   Yes  Ronnald Nian, MD   amLODipine (NORVASC) 5 MG tablet  Take 1 tablet (5 mg total) by mouth  daily.  02/25/12   Yes  Ronnald Nian, MD   desmopressin (DDAVP) 0.1 MG tablet  Take 0.1 mg by mouth daily.    Yes  Historical Provider, MD   hydrochlorothiazide (MICROZIDE) 12.5 MG capsule  TAKE ONE CAPSULE BY MOUTH EVERY DAY  08/26/12   Yes  Ronnald Nian, MD   metoprolol succinate (TOPROL-XL) 50 MG 24 hr tablet  Take 1 tablet (50 mg total) by mouth daily. Take with or immediately following a meal.  02/25/12   Yes  Ronnald Nian, MD   ramipril (ALTACE) 10 MG capsule  Take 1 capsule (10 mg total) by mouth daily.  02/25/12   Yes  Ronnald Nian, MD   ranitidine (ZANTAC) 150 MG tablet  Take 150 mg by mouth 2 (two) times daily.    Yes  Historical Provider, MD    Objective:  Filed Vitals:    02/23/13 1125   BP:  122/80   Pulse:  48     Physical Exam:  General: Alert and in no distress  HEENT: Tympanic membranes and canals are normal. Throat is clear. Tonsils are normal.  Neck: Supple without adenopathy or thyromegaly  CV: Mildly bradycardic, regular sinus rhythm without murmurs or gallops. No carotid bruits.  Pulm: Lungs are clear to auscultation  Ext: Strength 5/5 globally. Patellar, achilles DTRs brisk. 2+  palpable pulse Left dorsalis pedis, unable to palpate Right dorsalis pedis or posterior tibialis however CRT was brisk. 2+ pitting edema RLE to mid-calf.   Assessment and Plan:  1. Renal insufficiency  Will check serum creatinine today. - Comprehensive metabolic panel   2. Hypertension  Will continue metoprolol, HCTZ, amlodipine, ramipril. - CBC with Differential  - Comprehensive metabolic panel   3. History of colonic polyps  - Will schedule colonoscopy for January  - Ambulatory referral to Gastroenterology   4. Gout  Well managed with allopurinol  5. GERD (gastroesophageal reflux disease)  No reflux, heartburn or cough with his current regimen. Will continue ranitidine.  6. ASHD (arteriosclerotic heart disease)  No concern today for unstable angina.  7. Statin  intolerance   8. Hypercholesterolemia  - Will check Lipid panel today   Dr. Susann Givens was present for the encounter and agrees with the above assessment and plan.

## 2013-02-24 MED ORDER — EZETIMIBE 10 MG PO TABS: 10.0000 mg | ORAL_TABLET | Freq: Every day | ORAL | Status: DC

## 2013-02-24 MED ORDER — COLESEVELAM HCL 3.75 G PO PACK: 1.0000 | PACK | ORAL | Status: DC

## 2013-02-24 NOTE — Progress Notes (Signed)
  Subjective:    Patient ID: Chase Wright, male    DOB: Jul 20, 1945, 67 y.o.   MRN: [id number]  HPI    Review of Systems     Objective:   Physical Exam        Assessment & Plan:  His lipid panel is elevated. He is statin intolerant. I will place him on Zetia and Whelchol and see how he tolerates this.

## 2013-02-24 NOTE — Addendum Note (Signed)
Addended by: Ronnald Nian on: 02/24/2013 08:13 AM   Modules accepted: Orders

## 2013-02-25 ENCOUNTER — Other Ambulatory Visit: Payer: Self-pay | Admitting: Family Medicine

## 2013-02-28 ENCOUNTER — Other Ambulatory Visit: Payer: Self-pay | Admitting: Family Medicine

## 2013-04-22 ENCOUNTER — Encounter: Payer: Self-pay | Admitting: Family Medicine

## 2013-08-29 ENCOUNTER — Other Ambulatory Visit: Payer: Self-pay | Admitting: Family Medicine

## 2013-09-09 ENCOUNTER — Other Ambulatory Visit: Payer: Self-pay | Admitting: Family Medicine

## 2013-09-09 NOTE — Telephone Encounter (Signed)
Are these ok to refill? 

## 2013-09-30 ENCOUNTER — Encounter: Payer: Self-pay | Admitting: Family Medicine

## 2013-09-30 ENCOUNTER — Ambulatory Visit (INDEPENDENT_AMBULATORY_CARE_PROVIDER_SITE_OTHER): Payer: 59 | Admitting: Family Medicine

## 2013-09-30 VITALS — BP 100/68 | Wt 233.0 lb

## 2013-09-30 DIAGNOSIS — I10 Essential (primary) hypertension: Secondary | ICD-10-CM

## 2013-09-30 DIAGNOSIS — I2510 Atherosclerotic heart disease of native coronary artery without angina pectoris: Secondary | ICD-10-CM

## 2013-09-30 DIAGNOSIS — N289 Disorder of kidney and ureter, unspecified: Secondary | ICD-10-CM

## 2013-09-30 DIAGNOSIS — E7800 Pure hypercholesterolemia, unspecified: Secondary | ICD-10-CM

## 2013-09-30 DIAGNOSIS — Z789 Other specified health status: Secondary | ICD-10-CM

## 2013-09-30 MED ORDER — COLESEVELAM HCL 3.75 G PO PACK: 1.0000 | PACK | ORAL | Status: DC

## 2013-09-30 NOTE — Progress Notes (Signed)
Subjective:  Patient is a 68 y.o. male presenting today for a repeat labs since his insurance coverage is going to end and will be switched over to Medicare.  Hyperlipidemia - at his previous visit (02/2013), he was started on Zetia and Welchol, tried it for 2 weeks and then stopped it due to significant muscle aches. Has not been on any cholesterol medications since.  Hx of Arteriosclerotic disease - was released by Cardiologist, advised to set up appointment if issues arised. Has not had cp, sob, difficulty, doe, heart racing, palpitations. Hypertension - reports compliance with medication. Denies low blood pressure, dizziness, vision changes, edema. BPH - sees urologist yearly. No longer has nocturia, has not had urinary issues including incontinence, hesitancy, frequency. GERD - taking Zantac as needed. No reflux symptoms, epigastric pain or sour brash. Gout - takes Allopurinol daily. Has had not any gout flare ups. Colonscopy - patient decided to wait on colonscopy, did not get it done in January as planned at his last visit.  Patient has no other questions or concerns.  ROS as in subjective.   Objective:  BP 100/68  Wt 233 lb (105.688 kg)   General appearence: alert, no acute distress Neck: no carotid bruits, supple, no lymphadenopathy, no thyromegaly, no masses Heart: RRR, normal S1, S2, no murmurs Lungs: CTA bilaterally, no wheezes, rhonchi, or rales Abdomen: +bs, soft, non tender, non distended, no masses, no hepatomegaly Pulses: 2+ symmetric, upper and lower extremities, normal cap refill    ASHD (arteriosclerotic heart disease)  Hypercholesterolemia - Plan: Colesevelam HCl 3.75 G PACK  hypertension  Renal insufficiency  Statin intolerance     Plan:  Labs - previously done in 02/2013. Not due at this time.   Statin Intolerance - restart Welchol. F/u in 6 months.  Colonoscopy - advised not to put this off too long. Patient agreed will schedule in the near  future.  Continue all other medications as prescribed. F/u for annual exam in 6 months.  Patient was seen in conjunction with PA student Wallis BambergMario Mani, and I have also evaluated and examined patient, agree with student's notes, student supervised by me.

## 2014-02-01 ENCOUNTER — Ambulatory Visit (INDEPENDENT_AMBULATORY_CARE_PROVIDER_SITE_OTHER): Payer: Medicare Other | Admitting: Family Medicine

## 2014-02-01 ENCOUNTER — Encounter: Payer: Self-pay | Admitting: Family Medicine

## 2014-02-01 VITALS — BP 124/78 | HR 58 | Temp 98.5°F | Wt 242.0 lb

## 2014-02-01 DIAGNOSIS — N410 Acute prostatitis: Secondary | ICD-10-CM

## 2014-02-01 MED ORDER — SULFAMETHOXAZOLE-TMP DS 800-160 MG PO TABS: 1.0000 | ORAL_TABLET | Freq: Two times a day (BID) | ORAL | Status: DC

## 2014-02-01 NOTE — Progress Notes (Signed)
   Subjective:    Patient ID: Richrd HumblesJames Cates, male    DOB: Sep 12, 1945, 68 y.o.   MRN: [id number]  HPI He has a six-day history of testicular pain. He has had difficulty with this in the past and has seen Dr. Patsi Searsannenbaum for this. Septra usually works however it usually requires a full month's worth of medication. He has been taking Aleve which does help. No discharge, dysuria however he is having lower abdominal discomfort   Review of Systems     Objective:   Physical Exam Abdominal exam shows no masses or tenderness. Genitalia normal. Rectal exam does show a tender boggy prostate.       Assessment & Plan:  Prostatitis, acute - Plan: sulfamethoxazole-trimethoprim (BACTRIM DS) 800-160 MG per tablet  he will call if he has further difficulties. He is to return for complete examination.

## 2014-03-07 ENCOUNTER — Other Ambulatory Visit: Payer: Self-pay | Admitting: Family Medicine

## 2014-03-23 ENCOUNTER — Other Ambulatory Visit: Payer: Self-pay | Admitting: Family Medicine

## 2014-10-01 ENCOUNTER — Other Ambulatory Visit: Payer: Self-pay | Admitting: Family Medicine

## 2014-12-27 ENCOUNTER — Telehealth: Payer: Self-pay

## 2014-12-27 ENCOUNTER — Other Ambulatory Visit: Payer: Self-pay | Admitting: Family Medicine

## 2014-12-27 NOTE — Telephone Encounter (Signed)
OK to RF? Last here 02/01/14?

## 2014-12-27 NOTE — Telephone Encounter (Signed)
Needs an appt

## 2014-12-27 NOTE — Telephone Encounter (Signed)
Left message for patient that he needs to call for appointment before refills are provided per Dr Susann Givens.

## 2014-12-27 NOTE — Telephone Encounter (Signed)
LM to CB for appointment so that we may refill.

## 2015-01-01 ENCOUNTER — Other Ambulatory Visit: Payer: Self-pay | Admitting: Family Medicine

## 2015-01-10 ENCOUNTER — Ambulatory Visit (INDEPENDENT_AMBULATORY_CARE_PROVIDER_SITE_OTHER): Payer: Medicare Other | Admitting: Family Medicine

## 2015-01-10 ENCOUNTER — Encounter: Payer: Self-pay | Admitting: Family Medicine

## 2015-01-10 VITALS — BP 122/70 | HR 68 | Ht 64.0 in | Wt 245.0 lb

## 2015-01-10 DIAGNOSIS — Z86010 Personal history of colonic polyps: Secondary | ICD-10-CM

## 2015-01-10 DIAGNOSIS — M10261 Drug-induced gout, right knee: Secondary | ICD-10-CM

## 2015-01-10 DIAGNOSIS — I10 Essential (primary) hypertension: Secondary | ICD-10-CM | POA: Diagnosis not present

## 2015-01-10 DIAGNOSIS — E7800 Pure hypercholesterolemia, unspecified: Secondary | ICD-10-CM | POA: Diagnosis not present

## 2015-01-10 DIAGNOSIS — K219 Gastro-esophageal reflux disease without esophagitis: Secondary | ICD-10-CM

## 2015-01-10 DIAGNOSIS — I2510 Atherosclerotic heart disease of native coronary artery without angina pectoris: Secondary | ICD-10-CM

## 2015-01-10 DIAGNOSIS — Z8739 Personal history of other diseases of the musculoskeletal system and connective tissue: Secondary | ICD-10-CM

## 2015-01-10 DIAGNOSIS — Z8639 Personal history of other endocrine, nutritional and metabolic disease: Secondary | ICD-10-CM

## 2015-01-10 DIAGNOSIS — Z889 Allergy status to unspecified drugs, medicaments and biological substances status: Secondary | ICD-10-CM

## 2015-01-10 DIAGNOSIS — N289 Disorder of kidney and ureter, unspecified: Secondary | ICD-10-CM | POA: Diagnosis not present

## 2015-01-10 DIAGNOSIS — M1000 Idiopathic gout, unspecified site: Secondary | ICD-10-CM | POA: Diagnosis not present

## 2015-01-10 DIAGNOSIS — Z789 Other specified health status: Secondary | ICD-10-CM

## 2015-01-10 LAB — COMPREHENSIVE METABOLIC PANEL
ALT: 15 U/L (ref 9–46)
AST: 15 U/L (ref 10–35)
Albumin: 4.2 g/dL (ref 3.6–5.1)
Alkaline Phosphatase: 44 U/L (ref 40–115)
BUN: 35 mg/dL — ABNORMAL HIGH (ref 7–25)
CO2: 26 mmol/L (ref 20–31)
Calcium: 10.1 mg/dL (ref 8.6–10.3)
Chloride: 99 mmol/L (ref 98–110)
Creat: 1.69 mg/dL — ABNORMAL HIGH (ref 0.70–1.25)
Glucose, Bld: 87 mg/dL (ref 65–99)
Potassium: 4.4 mmol/L (ref 3.5–5.3)
Sodium: 136 mmol/L (ref 135–146)
Total Bilirubin: 0.8 mg/dL (ref 0.2–1.2)
Total Protein: 7.1 g/dL (ref 6.1–8.1)

## 2015-01-10 LAB — CBC WITH DIFFERENTIAL/PLATELET
Basophils Absolute: 0 10*3/uL (ref 0.0–0.1)
Basophils Relative: 0 % (ref 0–1)
Eosinophils Absolute: 0.4 10*3/uL (ref 0.0–0.7)
Eosinophils Relative: 5 % (ref 0–5)
HCT: 46.1 % (ref 39.0–52.0)
Hemoglobin: 16.5 g/dL (ref 13.0–17.0)
Lymphocytes Relative: 30 % (ref 12–46)
Lymphs Abs: 2.3 10*3/uL (ref 0.7–4.0)
MCH: 33.1 pg (ref 26.0–34.0)
MCHC: 35.8 g/dL (ref 30.0–36.0)
MCV: 92.4 fL (ref 78.0–100.0)
MPV: 9.6 fL (ref 8.6–12.4)
Monocytes Absolute: 0.6 10*3/uL (ref 0.1–1.0)
Monocytes Relative: 8 % (ref 3–12)
Neutro Abs: 4.4 10*3/uL (ref 1.7–7.7)
Neutrophils Relative %: 57 % (ref 43–77)
Platelets: 262 10*3/uL (ref 150–400)
RBC: 4.99 MIL/uL (ref 4.22–5.81)
RDW: 14.5 % (ref 11.5–15.5)
WBC: 7.7 10*3/uL (ref 4.0–10.5)

## 2015-01-10 LAB — LIPID PANEL
Cholesterol: 204 mg/dL — ABNORMAL HIGH (ref 125–200)
HDL: 28 mg/dL — ABNORMAL LOW (ref >=40)
LDL Cholesterol: 134 mg/dL — ABNORMAL HIGH (ref <130)
Total CHOL/HDL Ratio: 7.3 Ratio — ABNORMAL HIGH (ref <=5.0)
Triglycerides: 212 mg/dL — ABNORMAL HIGH (ref <150)
VLDL: 42 mg/dL — ABNORMAL HIGH (ref <30)

## 2015-01-10 LAB — URIC ACID: Uric Acid, Serum: 7.3 mg/dL (ref 4.0–7.8)

## 2015-01-10 NOTE — Patient Instructions (Addendum)
150 minutes a week of something physical which translates into roughly 20 minutes every day. Get off you're dead butt. Take her dogs for a walk  try 40 mg of Prilosec regularly for a week and if that doesn't make any every instance switch to Zyrtec at night do that for about a week Call Hospice [phone]

## 2015-01-10 NOTE — Progress Notes (Signed)
   Subjective:    Patient ID: Chase Wright, male    DOB: [birth year], 69 y.o.   MRN: [id number]  HPI He is here for an interval evaluation. His main complaint today is cough that he has had for approximately one year. He notes difficulty with cough mainly when he talks. He does complain of slight PND oh fever, chills, shortness of breath, sneezing, itchy watery eyes, chest pain. He also complains of difficulty with fatigue. He does admit to not being happy with his life. 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 He was working at [employer] but that job is no longer available. He is also had difficulty with an allergic reaction in the left eye and continues to be followed by his ophthalmologist. He has not seen his cardiologist in quite some time. He does have a previous history of renal insufficiency. He also has reflux but has not had any acid indigestion type symptoms. Review of record indicates he does have colonic polyps and has not had follow-up in quite some time. He also has a history of gout and is on allopurinol. He has had no joint tenderness. Of note is the fact that he lost a very close friend and has had difficulty dealing with her death.   Review of Systems     Objective:   Physical Exam Alert and in no distress. Tympanic membranes and canals are normal. Pharyngeal area is normal. Neck is supple without adenopathy or thyromegaly. Cardiac exam shows a regular sinus rhythm without murmurs or gallops. Lungs are clear to auscultation. Donald exam shows no masses or tenderness.        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 I explained that the fatigue is probably as much deconditioning is anything. He will continue to be followed by his ophthalmologist. Routine blood work will be done. Also recommend that he get more physically active by taking his dogs out. Discusses retirement and strongly encouraged him to get involved in something that's more intellectually stimulating and gives him some purpose. Recommend he call hospice bereavement to help deal with the loss of his friend. over 45 minutes, greater than 50% spent in counseling and coordination of care.

## 2015-01-11 NOTE — Addendum Note (Signed)
Addended by: Ronnald Nian on: 01/11/2015 08:18 AM   Modules accepted: Orders

## 2015-02-09 ENCOUNTER — Telehealth: Payer: Self-pay

## 2015-02-09 NOTE — Telephone Encounter (Signed)
Sounds musculoskeletal rather than anything else. Heat and stretching as well as Advil or Aleve would be my main recommendation

## 2015-02-09 NOTE — Telephone Encounter (Signed)
Dull ache in right kidney, wants to make sure he is ok but doesn't want to make an appt if he doesn't have to. He says when he gets out of bed he can feel it pull and knows something isn't right. Been going on about a week now, he says it isn't excruciatingly painful but it does hurt. He won't go ahead and make an appt he wants to talk on the phone with either Dr. [name] or Dennard Nipheri first.

## 2015-02-09 NOTE — Telephone Encounter (Signed)
Patient informed and verbalized understanding

## 2015-02-13 ENCOUNTER — Other Ambulatory Visit: Payer: Medicare Other

## 2015-02-13 DIAGNOSIS — N289 Disorder of kidney and ureter, unspecified: Secondary | ICD-10-CM

## 2015-02-13 LAB — BASIC METABOLIC PANEL
BUN: 28 mg/dL — ABNORMAL HIGH (ref 7–25)
CO2: 27 mmol/L (ref 20–31)
Calcium: 9 mg/dL (ref 8.6–10.3)
Chloride: 101 mmol/L (ref 98–110)
Creat: 1.45 mg/dL — ABNORMAL HIGH (ref 0.70–1.25)
Glucose, Bld: 82 mg/dL (ref 65–99)
Potassium: 4.6 mmol/L (ref 3.5–5.3)
Sodium: 140 mmol/L (ref 135–146)

## 2015-02-25 ENCOUNTER — Emergency Department (HOSPITAL_BASED_OUTPATIENT_CLINIC_OR_DEPARTMENT_OTHER)
Admission: EM | Admit: 2015-02-25 | Discharge: 2015-02-25 | Disposition: A | Discharge status: Home / Self Care | Payer: Medicare Other | Attending: Emergency Medicine | Admitting: Emergency Medicine

## 2015-02-25 ENCOUNTER — Emergency Department (HOSPITAL_BASED_OUTPATIENT_CLINIC_OR_DEPARTMENT_OTHER): Payer: Medicare Other

## 2015-02-25 ENCOUNTER — Encounter (HOSPITAL_BASED_OUTPATIENT_CLINIC_OR_DEPARTMENT_OTHER): Payer: Self-pay | Admitting: *Deleted

## 2015-02-25 DIAGNOSIS — Z9889 Other specified postprocedural states: Secondary | ICD-10-CM | POA: Insufficient documentation

## 2015-02-25 DIAGNOSIS — M545 Low back pain, unspecified: Secondary | ICD-10-CM

## 2015-02-25 DIAGNOSIS — Z8711 Personal history of peptic ulcer disease: Secondary | ICD-10-CM | POA: Insufficient documentation

## 2015-02-25 DIAGNOSIS — Z79899 Other long term (current) drug therapy: Secondary | ICD-10-CM | POA: Diagnosis not present

## 2015-02-25 DIAGNOSIS — Z8719 Personal history of other diseases of the digestive system: Secondary | ICD-10-CM | POA: Diagnosis not present

## 2015-02-25 DIAGNOSIS — E669 Obesity, unspecified: Secondary | ICD-10-CM | POA: Diagnosis not present

## 2015-02-25 DIAGNOSIS — Z86010 Personal history of colonic polyps: Secondary | ICD-10-CM | POA: Diagnosis not present

## 2015-02-25 DIAGNOSIS — Z87442 Personal history of urinary calculi: Secondary | ICD-10-CM | POA: Insufficient documentation

## 2015-02-25 DIAGNOSIS — I129 Hypertensive chronic kidney disease with stage 1 through stage 4 chronic kidney disease, or unspecified chronic kidney disease: Secondary | ICD-10-CM | POA: Insufficient documentation

## 2015-02-25 DIAGNOSIS — N189 Chronic kidney disease, unspecified: Secondary | ICD-10-CM | POA: Insufficient documentation

## 2015-02-25 DIAGNOSIS — R109 Unspecified abdominal pain: Secondary | ICD-10-CM | POA: Diagnosis not present

## 2015-02-25 LAB — CBC WITH DIFFERENTIAL/PLATELET
Basophils Absolute: 0 10*3/uL (ref 0.0–0.1)
Basophils Relative: 1 %
Eosinophils Absolute: 0.4 10*3/uL (ref 0.0–0.7)
Eosinophils Relative: 6 %
HCT: 44 % (ref 39.0–52.0)
Hemoglobin: 14.9 g/dL (ref 13.0–17.0)
Lymphocytes Relative: 28 %
Lymphs Abs: 2.1 10*3/uL (ref 0.7–4.0)
MCH: 32.2 pg (ref 26.0–34.0)
MCHC: 33.9 g/dL (ref 30.0–36.0)
MCV: 95 fL (ref 78.0–100.0)
Monocytes Absolute: 0.7 10*3/uL (ref 0.1–1.0)
Monocytes Relative: 10 %
Neutro Abs: 4.1 10*3/uL (ref 1.7–7.7)
Neutrophils Relative %: 55 %
Platelets: 233 10*3/uL (ref 150–400)
RBC: 4.63 MIL/uL (ref 4.22–5.81)
RDW: 13.7 % (ref 11.5–15.5)
WBC: 7.3 10*3/uL (ref 4.0–10.5)

## 2015-02-25 LAB — URINALYSIS, ROUTINE W REFLEX MICROSCOPIC
Bilirubin Urine: NEGATIVE
Glucose, UA: NEGATIVE mg/dL
Hgb urine dipstick: NEGATIVE
Ketones, ur: NEGATIVE mg/dL
Leukocytes, UA: NEGATIVE
Nitrite: NEGATIVE
Protein, ur: NEGATIVE mg/dL
Specific Gravity, Urine: 1.018 (ref 1.005–1.030)
pH: 6 (ref 5.0–8.0)

## 2015-02-25 LAB — BASIC METABOLIC PANEL
Anion gap: 7 (ref 5–15)
BUN: 29 mg/dL — ABNORMAL HIGH (ref 6–20)
CO2: 27 mmol/L (ref 22–32)
Calcium: 9.1 mg/dL (ref 8.9–10.3)
Chloride: 103 mmol/L (ref 101–111)
Creatinine, Ser: 1.6 mg/dL — ABNORMAL HIGH (ref 0.61–1.24)
GFR calc Af Amer: 50 mL/min — ABNORMAL LOW (ref >60)
GFR calc non Af Amer: 43 mL/min — ABNORMAL LOW (ref >60)
Glucose, Bld: 97 mg/dL (ref 65–99)
Potassium: 4.4 mmol/L (ref 3.5–5.1)
Sodium: 137 mmol/L (ref 135–145)

## 2015-02-25 MED ORDER — BUPIVACAINE HCL (PF) 0.5 % IJ SOLN
10.0000 mL | Freq: Once | INTRAMUSCULAR | Status: AC
  Administered 2015-02-25: 10 mL
  Filled 2015-02-25: qty 10

## 2015-02-25 NOTE — ED Notes (Signed)
Patient c/o R flank for the past week, took 3 advil around 2:30am and took 2 additional advil at 4:30 am due to pain.Hx of kidney stones

## 2015-02-25 NOTE — ED Provider Notes (Signed)
CSN: [id number]     Arrival date & time 02/25/15  1031 History   First MD Initiated Contact with Patient 02/25/15 1038     Chief Complaint  Patient presents with  . Flank Pain     (Consider location/radiation/quality/duration/timing/severity/associated sxs/prior Treatment) Patient is a 69 y.o. male presenting with flank pain. The history is provided by the patient.  Flank Pain This is a new problem. Episode onset: 10 days ago. The problem occurs constantly. The problem has not changed since onset.Pertinent negatives include no abdominal pain and no shortness of breath. Nothing aggravates the symptoms. The symptoms are relieved by NSAIDs.    Past Medical History  Diagnosis Date  . Hypertension   . Obesity   . Chronic kidney disease     RENAL STONES  . Renal insufficiency   . PUD (peptic ulcer disease)   . GERD (gastroesophageal reflux disease)   . ASHD (arteriosclerotic heart disease)   . Diverticulitis   . Hx of colonic polyps    Past Surgical History  Procedure Laterality Date  . Cardiac catheterization  08/2005  . Nasal endoscopy  2008   No family history on file. Social History  Substance Use Topics  . Smoking status: Never Smoker   . Smokeless tobacco: None  . Alcohol Use: Yes    Review of Systems  Respiratory: Negative for shortness of breath.   Gastrointestinal: Negative for abdominal pain.  Genitourinary: Positive for flank pain.  All other systems reviewed and are negative.     Allergies  Review of patient's allergies indicates no known allergies.  Home Medications   Prior to Admission medications   Medication Sig Start Date End Date Taking? Authorizing Provider  alfuzosin (UROXATRAL) 10 MG 24 hr tablet Take 10 mg by mouth daily.      Historical Provider, MD  allopurinol (ZYLOPRIM) 300 MG tablet TAKE 1 TABLET BY MOUTH EVERY DAY 12/27/14   Ronnald NianJohn C Lalonde, MD  amLODipine (NORVASC) 5 MG tablet Take 1 tablet (5 mg total) by mouth daily. 02/25/12   Ronnald NianJohn C  Lalonde, MD  hydrochlorothiazide (MICROZIDE) 12.5 MG capsule TAKE ONE CAPSULE BY MOUTH EVERY DAY 01/02/15   Ronnald NianJohn C Lalonde, MD  metoprolol succinate (TOPROL-XL) 50 MG 24 hr tablet Take 1 tablet (50 mg total) by mouth daily. Take with or immediately following a meal. 02/25/12   Ronnald NianJohn C Lalonde, MD  ramipril (ALTACE) 10 MG capsule TAKE ONE CAPSULE BY MOUTH EVERY DAY 01/02/15   Ronnald NianJohn C Lalonde, MD   BP 146/85 mmHg  Pulse 72  Temp(Src) 98.4 F (36.9 C) (Oral)  Resp 20  Ht 5\' 6"  (1.676 m)  Wt 210 lb (95.255 kg)  BMI 33.91 kg/m2  SpO2 96% Physical Exam  Constitutional: He is oriented to person, place, and time. He appears well-developed and well-nourished. No distress.  HENT:  Head: Normocephalic and atraumatic.  Eyes: Conjunctivae are normal.  Neck: Neck supple. No tracheal deviation present.  Cardiovascular: Normal rate and regular rhythm.   Pulmonary/Chest: Effort normal. No respiratory distress.  Abdominal: Soft. He exhibits no distension. There is no tenderness. There is no rebound.  Musculoskeletal:       Lumbar back: He exhibits tenderness. He exhibits normal range of motion and no bony tenderness.       Back:  Neurological: He is alert and oriented to person, place, and time.  Skin: Skin is warm and dry.  Psychiatric: He has a normal mood and affect.    ED Course  Procedures (including  critical care time)  Procedure Note: Trigger Point Injection for Myofascial pain  Performed by Dr. Clydene Pugh Indication: muscle/myofascial pain Muscle body and tendon sheath of the right lumbar paraspinal muscle(s) were injected with 0.5% bupivacaine under sterile technique for release of muscle spasm/pain in 3 areas. Patient tolerated well with immediate improvement of symptoms and no immediate complications following procedure.  CPT Code:   1 or 2 muscle bodies: 20552 3 or more: 16109    Labs Review Labs Reviewed  BASIC METABOLIC PANEL - Abnormal; Notable for the following:    BUN 29 (*)      Creatinine, Ser 1.60 (*)    GFR calc non Af Amer 43 (*)    GFR calc Af Amer 50 (*)    All other components within normal limits  URINALYSIS, ROUTINE W REFLEX MICROSCOPIC (NOT AT [facility])  CBC WITH DIFFERENTIAL/PLATELET    Imaging Review Ct Renal Stone Study  02/25/2015  CLINICAL DATA:  Worsening right flank pain for 10 days. No hematuria. EXAM: CT ABDOMEN AND PELVIS WITHOUT CONTRAST TECHNIQUE: Multidetector CT imaging of the abdomen and pelvis was performed following the standard protocol without IV contrast. COMPARISON:  02/22/2009 FINDINGS: Lung bases:  Clear.  Heart normal in size. Liver:  Fatty infiltration.  Otherwise unremarkable. Spleen, gallbladder, pancreas, adrenal glands:  Unremarkable. Kidneys, ureters, bladder: Bilateral low-density renal masses consistent with cysts, largest arising from the upper pole of the right kidney measuring 7.4 cm. These are similar to the prior study. No renal stones. No hydronephrosis. There is bilateral renal cortical thinning. Ureters normal course and in caliber. Bladder is unremarkable Lymph nodes:  No adenopathy. Ascites:  None. Vascular: Atherosclerotic calcifications noted along a normal caliber abdominal aorta and iliac arteries. Gastrointestinal: Scattered colonic diverticula. No diverticulitis. Colon otherwise unremarkable. Normal stomach and small bowel. Normal appendix is visualized. Musculoskeletal: Degenerative changes of the visualized spine. No osteoblastic or osteolytic lesions. IMPRESSION: 1. No acute findings. 2. No evidence of renal or ureteral stones or obstructive uropathy. 3. Bilateral low-density renal masses consistent with cysts similar to the prior CT. 4. Hepatic steatosis. 5. Colonic diverticulosis.  No diverticulitis. 6. Degenerative changes noted of the visualized spine. Electronically Signed   By: Amie Portland M.D.   On: 02/25/2015 11:58   I have personally reviewed and evaluated these images and lab results as part of my medical  decision-making.   EKG Interpretation None      MDM   Final diagnoses:  Right flank pain  Right-sided low back pain without sciatica    69 year old male presents with ongoing right low back and flank pain that has been occurring over the last week gradually. He saw his primary care physician who suspected he had a low back strain. He states he has a history of multiple kidney stones and is concerned this may represent 1. He does have some ongoing renal insufficiency issues that he has been seen for previously. No blood in the urine today, attempted bedside ultrasound but interpretation is limited secondary to large bilateral renal cysts obscuring exam. CT abdomen pelvis to look for stone shows no acute findings. Patient offered trigger point injection in 3 areas of right lower back with immediate improvement in symptoms. He is well-appearing. Plan to follow up with PCP as needed and return precautions discussed for worsening or new concerning symptoms.     Lyndal Pulley, MD 02/25/15 1230

## 2015-02-25 NOTE — Discharge Instructions (Signed)

## 2015-03-24 DIAGNOSIS — N401 Enlarged prostate with lower urinary tract symptoms: Secondary | ICD-10-CM | POA: Diagnosis not present

## 2015-03-24 DIAGNOSIS — R351 Nocturia: Secondary | ICD-10-CM | POA: Diagnosis not present

## 2015-03-24 DIAGNOSIS — R972 Elevated prostate specific antigen [PSA]: Secondary | ICD-10-CM | POA: Diagnosis not present

## 2015-05-19 DIAGNOSIS — N401 Enlarged prostate with lower urinary tract symptoms: Secondary | ICD-10-CM | POA: Diagnosis not present

## 2015-05-19 DIAGNOSIS — R972 Elevated prostate specific antigen [PSA]: Secondary | ICD-10-CM | POA: Diagnosis not present

## 2015-05-19 DIAGNOSIS — Z0000 Encounter for general adult medical examination without abnormal findings: Secondary | ICD-10-CM | POA: Diagnosis not present

## 2015-05-20 DIAGNOSIS — S83242A Other tear of medial meniscus, current injury, left knee, initial encounter: Secondary | ICD-10-CM | POA: Diagnosis not present

## 2015-05-26 ENCOUNTER — Encounter: Payer: Self-pay | Admitting: Family Medicine

## 2015-05-26 ENCOUNTER — Ambulatory Visit (INDEPENDENT_AMBULATORY_CARE_PROVIDER_SITE_OTHER): Payer: Medicare Other | Admitting: Family Medicine

## 2015-05-26 VITALS — BP 140/82 | HR 62 | Ht 64.0 in | Wt 251.4 lb

## 2015-05-26 DIAGNOSIS — M25562 Pain in left knee: Secondary | ICD-10-CM

## 2015-05-26 NOTE — Patient Instructions (Signed)
Take 2 Aleve twice per day. Heat to your knee for 20 minutes 3 times per day

## 2015-05-26 NOTE — Progress Notes (Signed)
   Subjective:    Patient ID: Chase Fort., male    DOB: [birth year], 70 y.o.   MRN: [id number]  HPI Was seen in an orthopedic urgent care center on February 11 for evaluation of left knee pain. He describes falling down and landing on the left knee and developing pain the next day. He was told that he had meniscal damageAnd apparently was given an injection. He is still having difficulty with this.   Review of Systems     Objective:   Physical Exam Left knee exam shows questionable effusion with tenderness mainly over the pes anserine bursa area. McMurray's testing was negative       Assessment & Plan:  .Left knee pain Recommend continued conservative care and follow-up here in 10 days. I explained that the area where he is having pain now is different than what is described and therefore follow-up would be appropriate.

## 2015-06-09 ENCOUNTER — Ambulatory Visit: Payer: Medicare Other | Admitting: Family Medicine

## 2015-06-18 ENCOUNTER — Other Ambulatory Visit: Payer: Self-pay | Admitting: Family Medicine

## 2015-08-27 ENCOUNTER — Other Ambulatory Visit: Payer: Self-pay | Admitting: Family Medicine

## 2015-09-14 ENCOUNTER — Other Ambulatory Visit: Payer: Self-pay | Admitting: Family Medicine

## 2015-11-23 ENCOUNTER — Other Ambulatory Visit: Payer: Self-pay | Admitting: Family Medicine

## 2015-12-05 ENCOUNTER — Encounter: Payer: Self-pay | Admitting: Family Medicine

## 2015-12-05 ENCOUNTER — Ambulatory Visit (INDEPENDENT_AMBULATORY_CARE_PROVIDER_SITE_OTHER): Payer: Medicare Other | Admitting: Family Medicine

## 2015-12-05 VITALS — BP 110/60 | HR 73 | Temp 98.4°F | Wt 246.0 lb

## 2015-12-05 DIAGNOSIS — J209 Acute bronchitis, unspecified: Secondary | ICD-10-CM | POA: Diagnosis not present

## 2015-12-05 DIAGNOSIS — Z8639 Personal history of other endocrine, nutritional and metabolic disease: Secondary | ICD-10-CM

## 2015-12-05 DIAGNOSIS — Z8739 Personal history of other diseases of the musculoskeletal system and connective tissue: Secondary | ICD-10-CM

## 2015-12-05 MED ORDER — ALLOPURINOL 300 MG PO TABS: 300.0000 mg | ORAL_TABLET | Freq: Every day | ORAL | 0 refills | Status: DC

## 2015-12-05 MED ORDER — AMOXICILLIN-POT CLAVULANATE 875-125 MG PO TABS: 1.0000 | ORAL_TABLET | Freq: Two times a day (BID) | ORAL | 0 refills | Status: DC

## 2015-12-05 MED ORDER — BENZONATATE 100 MG PO CAPS: 100.0000 mg | ORAL_CAPSULE | Freq: Three times a day (TID) | ORAL | 0 refills | Status: DC | PRN

## 2015-12-05 NOTE — Progress Notes (Signed)
   Subjective:    Patient ID: Chase FortJames I Justo Jr., male    DOB: 1945/12/15, 70 y.o.   MRN: [id number]  HPI He complains of a 10 day history of coughing. He did give himself some Prilosec which did help briefly but he is continued to have trouble with that. He also gets a slight headache but no fever until Sunday which lasted only one day. No sore throat, earache, shortness of breath. He also would like a refill on his allopurinol. He has not been here in quite some time for an exam.   Review of Systems     Objective:   Physical Exam Alert and in no distress. Tympanic membranes and canals are normal. Pharyngeal area is normal. Neck is supple without adenopathy or thyromegaly. Cardiac exam shows a regular sinus rhythm without murmurs or gallops. Lungs are clear to auscultation.        Assessment & Plan:  Acute bronchitis, unspecified organism - Plan: amoxicillin-clavulanate (AUGMENTIN) 875-125 MG tablet, benzonatate (TESSALON) 100 MG capsule  History of gout - Plan: allopurinol (ZYLOPRIM) 300 MG tablet His last uric acid level was elevated. He does have underlying renal disease. Recommend he return here in the next several months for complete examination. He is also to call if not entirely better from the antibiotic.

## 2015-12-05 NOTE — Patient Instructions (Signed)
Take all the antibiotic and call me if you not totally back to normal when you finish it. You can also use NyQuil at night to help with the coughing

## 2015-12-12 ENCOUNTER — Telehealth: Payer: Self-pay | Admitting: Family Medicine

## 2015-12-12 DIAGNOSIS — J209 Acute bronchitis, unspecified: Secondary | ICD-10-CM

## 2015-12-12 MED ORDER — AMOXICILLIN-POT CLAVULANATE 875-125 MG PO TABS: 1.0000 | ORAL_TABLET | Freq: Two times a day (BID) | ORAL | 0 refills | Status: DC

## 2015-12-12 NOTE — Telephone Encounter (Signed)
Let him know that I called in another prescription in 

## 2015-12-12 NOTE — Telephone Encounter (Signed)
Pt notified that rx sent to pharmacy. Trixie Rude/RLB

## 2015-12-12 NOTE — Telephone Encounter (Signed)
Pt feels about 50% better and he has 2 days worth of antibiotic left. Pt still has some chest congestion & coughing a lot. Feels as if "a blanket" is on his chest.

## 2016-01-05 ENCOUNTER — Ambulatory Visit (INDEPENDENT_AMBULATORY_CARE_PROVIDER_SITE_OTHER): Payer: Medicare Other | Admitting: Family Medicine

## 2016-01-05 ENCOUNTER — Encounter: Payer: Self-pay | Admitting: Family Medicine

## 2016-01-05 ENCOUNTER — Ambulatory Visit
Admission: RE | Admit: 2016-01-05 | Discharge: 2016-01-05 | Disposition: A | Discharge status: Home / Self Care | Payer: Medicare Other | Source: Ambulatory Visit | Attending: Family Medicine | Admitting: Family Medicine

## 2016-01-05 VITALS — BP 100/60 | HR 58 | Temp 98.2°F | Wt 245.0 lb

## 2016-01-05 DIAGNOSIS — J189 Pneumonia, unspecified organism: Secondary | ICD-10-CM

## 2016-01-05 DIAGNOSIS — R05 Cough: Secondary | ICD-10-CM | POA: Diagnosis not present

## 2016-01-05 DIAGNOSIS — J181 Lobar pneumonia, unspecified organism: Secondary | ICD-10-CM

## 2016-01-05 DIAGNOSIS — R053 Chronic cough: Secondary | ICD-10-CM

## 2016-01-05 LAB — CBC WITH DIFFERENTIAL/PLATELET
Basophils Absolute: 0 cells/uL (ref 0–200)
Basophils Relative: 0 %
Eosinophils Absolute: 658 cells/uL — ABNORMAL HIGH (ref 15–500)
Eosinophils Relative: 7 %
HCT: 45.1 % (ref 38.5–50.0)
Hemoglobin: 15.2 g/dL (ref 13.2–17.1)
Lymphocytes Relative: 26 %
Lymphs Abs: 2444 cells/uL (ref 850–3900)
MCH: 31.1 pg (ref 27.0–33.0)
MCHC: 33.7 g/dL (ref 32.0–36.0)
MCV: 92.4 fL (ref 80.0–100.0)
MPV: 9.7 fL (ref 7.5–12.5)
Monocytes Absolute: 1034 cells/uL — ABNORMAL HIGH (ref 200–950)
Monocytes Relative: 11 %
Neutro Abs: 5264 cells/uL (ref 1500–7800)
Neutrophils Relative %: 56 %
Platelets: 269 10*3/uL (ref 140–400)
RBC: 4.88 MIL/uL (ref 4.20–5.80)
RDW: 14.4 % (ref 11.0–15.0)
WBC: 9.4 10*3/uL (ref 4.0–10.5)

## 2016-01-05 LAB — COMPREHENSIVE METABOLIC PANEL
ALT: 17 U/L (ref 9–46)
AST: 16 U/L (ref 10–35)
Albumin: 4 g/dL (ref 3.6–5.1)
Alkaline Phosphatase: 56 U/L (ref 40–115)
BUN: 26 mg/dL — ABNORMAL HIGH (ref 7–25)
CO2: 24 mmol/L (ref 20–31)
Calcium: 9.7 mg/dL (ref 8.6–10.3)
Chloride: 102 mmol/L (ref 98–110)
Creat: 1.36 mg/dL — ABNORMAL HIGH (ref 0.70–1.25)
Glucose, Bld: 98 mg/dL (ref 65–99)
Potassium: 4.5 mmol/L (ref 3.5–5.3)
Sodium: 138 mmol/L (ref 135–146)
Total Bilirubin: 0.5 mg/dL (ref 0.2–1.2)
Total Protein: 6.9 g/dL (ref 6.1–8.1)

## 2016-01-05 MED ORDER — LEVOFLOXACIN 500 MG PO TABS: 500.0000 mg | ORAL_TABLET | Freq: Every day | ORAL | 0 refills | Status: DC

## 2016-01-05 MED ORDER — ALBUTEROL SULFATE HFA 108 (90 BASE) MCG/ACT IN AERS: 2.0000 | INHALATION_SPRAY | Freq: Four times a day (QID) | RESPIRATORY_TRACT | 0 refills | Status: DC | PRN

## 2016-01-05 NOTE — Progress Notes (Signed)
   Subjective:    Patient ID: Chase FortJames I Terrance Jr., male    DOB: 03/27/1946, 70 y.o.   MRN: [id number]  HPI He is here for consult concerning continued difficulty with a cough. He was treated in late August with antibiotics with a repeat of the antibiotic. He states that he is at least 60% better. He still has difficulty with cough, slight shortness of breath and some rhinorrhea but no fever, chills, sore throat, earache. He is concerned that this could be possibly mold or fungal related since he does have animals. He then stated that he has had difficulty with cough for well over a year with some congestion and watery eyes. He was told this was allergy related.   Review of Systems     Objective:   Physical Exam Alert and in no distress. Tympanic membranes and canals are normal. Pharyngeal area is normal. Neck is supple without adenopathy or thyromegaly. Cardiac exam shows a regular sinus rhythm without murmurs or gallops. Lungs are clear to auscultation.        Assessment & Plan:  Chronic cough - Plan: CBC with Differential/Platelet, Comprehensive metabolic panel, DG Chest 2 View, albuterol (PROVENTIL HFA;VENTOLIN HFA) 108 (90 Base) MCG/ACT inhaler  Right middle lobe pneumonia - Plan: levofloxacin (LEVAQUIN) 500 MG tablet He was sent for x-ray which did show right middle lobe pneumonia possible cancer. I discussed this with him in detail. I will place him on Levaquin for 10 days. He is to call me at that time and let me know how he is doing In 2 weeks Explained that a CT scan now versus in the month would not make a difference. He was comfortable with that approach. Will also give albuterol to help with the cough.

## 2016-01-15 ENCOUNTER — Telehealth: Payer: Self-pay | Admitting: Family Medicine

## 2016-01-15 DIAGNOSIS — J189 Pneumonia, unspecified organism: Secondary | ICD-10-CM

## 2016-01-15 DIAGNOSIS — J181 Lobar pneumonia, unspecified organism: Principal | ICD-10-CM

## 2016-01-15 MED ORDER — LEVOFLOXACIN 500 MG PO TABS: 500.0000 mg | ORAL_TABLET | Freq: Every day | ORAL | 0 refills | Status: DC

## 2016-01-15 NOTE — Telephone Encounter (Signed)
Pt called with update. He states he finished medication yesterday. Pt states he is a lot better but is still coughing. Pt uses [facility] on [address] and can be reached at [phone]. Please advise pt.

## 2016-03-13 ENCOUNTER — Telehealth: Payer: Self-pay

## 2016-03-13 NOTE — Telephone Encounter (Signed)
Pt called to cancel appt and wanted to let you know his partner Chase Wright (DOB 10/18/1944) [facility]. dx with CHF. Trixie Rude/RLB

## 2016-03-14 ENCOUNTER — Ambulatory Visit: Payer: Medicare Other | Admitting: Family Medicine

## 2016-04-28 ENCOUNTER — Other Ambulatory Visit: Payer: Self-pay | Admitting: Family Medicine

## 2016-04-29 NOTE — Telephone Encounter (Signed)
Please advise on HCTZ refill?

## 2016-04-29 NOTE — Telephone Encounter (Signed)
I renewed these meds but he needs to set up a med check appointment

## 2016-04-29 NOTE — Telephone Encounter (Signed)
Left message on voice mail to please make a med check appointment per Dr.Lalonde

## 2016-06-01 ENCOUNTER — Other Ambulatory Visit: Payer: Self-pay | Admitting: Family Medicine

## 2016-06-01 DIAGNOSIS — Z8739 Personal history of other diseases of the musculoskeletal system and connective tissue: Secondary | ICD-10-CM

## 2016-06-10 DIAGNOSIS — H1013 Acute atopic conjunctivitis, bilateral: Secondary | ICD-10-CM | POA: Diagnosis not present

## 2016-06-10 DIAGNOSIS — H2513 Age-related nuclear cataract, bilateral: Secondary | ICD-10-CM | POA: Diagnosis not present

## 2016-07-23 ENCOUNTER — Encounter: Payer: Medicare Other | Admitting: Family Medicine

## 2016-07-25 ENCOUNTER — Ambulatory Visit (INDEPENDENT_AMBULATORY_CARE_PROVIDER_SITE_OTHER): Payer: Medicare Other | Admitting: Family Medicine

## 2016-07-25 ENCOUNTER — Encounter: Payer: Self-pay | Admitting: Family Medicine

## 2016-07-25 VITALS — BP 120/70 | HR 60 | Wt 252.0 lb

## 2016-07-25 DIAGNOSIS — Z8739 Personal history of other diseases of the musculoskeletal system and connective tissue: Secondary | ICD-10-CM

## 2016-07-25 DIAGNOSIS — Z87442 Personal history of urinary calculi: Secondary | ICD-10-CM | POA: Insufficient documentation

## 2016-07-25 DIAGNOSIS — N401 Enlarged prostate with lower urinary tract symptoms: Secondary | ICD-10-CM | POA: Insufficient documentation

## 2016-07-25 DIAGNOSIS — Z1211 Encounter for screening for malignant neoplasm of colon: Secondary | ICD-10-CM | POA: Diagnosis not present

## 2016-07-25 DIAGNOSIS — I2510 Atherosclerotic heart disease of native coronary artery without angina pectoris: Secondary | ICD-10-CM | POA: Diagnosis not present

## 2016-07-25 DIAGNOSIS — K219 Gastro-esophageal reflux disease without esophagitis: Secondary | ICD-10-CM

## 2016-07-25 DIAGNOSIS — E7800 Pure hypercholesterolemia, unspecified: Secondary | ICD-10-CM | POA: Diagnosis not present

## 2016-07-25 DIAGNOSIS — Z1159 Encounter for screening for other viral diseases: Secondary | ICD-10-CM

## 2016-07-25 DIAGNOSIS — I10 Essential (primary) hypertension: Secondary | ICD-10-CM

## 2016-07-25 LAB — COMPREHENSIVE METABOLIC PANEL
ALT: 17 U/L (ref 9–46)
AST: 17 U/L (ref 10–35)
Albumin: 4.2 g/dL (ref 3.6–5.1)
Alkaline Phosphatase: 54 U/L (ref 40–115)
BUN: 37 mg/dL — ABNORMAL HIGH (ref 7–25)
CO2: 21 mmol/L (ref 20–31)
Calcium: 9.7 mg/dL (ref 8.6–10.3)
Chloride: 100 mmol/L (ref 98–110)
Creat: 1.67 mg/dL — ABNORMAL HIGH (ref 0.70–1.25)
Glucose, Bld: 80 mg/dL (ref 65–99)
Potassium: 4.9 mmol/L (ref 3.5–5.3)
Sodium: 136 mmol/L (ref 135–146)
Total Bilirubin: 0.5 mg/dL (ref 0.2–1.2)
Total Protein: 7.1 g/dL (ref 6.1–8.1)

## 2016-07-25 LAB — CBC WITH DIFFERENTIAL/PLATELET
Basophils Absolute: 88 cells/uL (ref 0–200)
Basophils Relative: 1 %
Eosinophils Absolute: 352 cells/uL (ref 15–500)
Eosinophils Relative: 4 %
HCT: 45.8 % (ref 38.5–50.0)
Hemoglobin: 15.3 g/dL (ref 13.2–17.1)
Lymphocytes Relative: 35 %
Lymphs Abs: 3080 cells/uL (ref 850–3900)
MCH: 31.2 pg (ref 27.0–33.0)
MCHC: 33.4 g/dL (ref 32.0–36.0)
MCV: 93.5 fL (ref 80.0–100.0)
MPV: 9.6 fL (ref 7.5–12.5)
Monocytes Absolute: 704 cells/uL (ref 200–950)
Monocytes Relative: 8 %
Neutro Abs: 4576 cells/uL (ref 1500–7800)
Neutrophils Relative %: 52 %
Platelets: 286 10*3/uL (ref 140–400)
RBC: 4.9 MIL/uL (ref 4.20–5.80)
RDW: 14.5 % (ref 11.0–15.0)
WBC: 8.8 10*3/uL (ref 4.0–10.5)

## 2016-07-25 LAB — LIPID PANEL
Cholesterol: 210 mg/dL — ABNORMAL HIGH (ref <200)
HDL: 34 mg/dL — ABNORMAL LOW (ref >40)
LDL Cholesterol: 147 mg/dL — ABNORMAL HIGH (ref <100)
Total CHOL/HDL Ratio: 6.2 Ratio — ABNORMAL HIGH (ref <5.0)
Triglycerides: 146 mg/dL (ref <150)
VLDL: 29 mg/dL (ref <30)

## 2016-07-25 NOTE — Progress Notes (Signed)
Subjective:     Patient ID: Chase Fort., male   DOB: 04/13/1945, 71 y.o.   MRN: [id number]  HPI Chase Wright is a 71 year old male with a past medical history of hypertension, renal insufficiency, GERD, atherosclerotic heart disease, hypercholesteromia, gout, BPH, and colonic polyps who presents today for a medication check and concerns about aches and pains as well as tingling in his feet. 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 Presently no chest pain, shortness of breath, PND or DOE. He has not seen cardiology in several years. The aches and pains started 6 months ago and had a gradual onset. They are generalized aches that feel like he has the flu, but without any other associated systemic issues. They are worse in the morning and once he moves around they get better. He reports taking aspirin helps a lot as well. He says these aren't debilitating or preventing him from performing his daily activities. He has no headaches, nausea, vomiting, fever, chills, or other symptoms associated with it.  He also reports tingling in his feet over the past six months. This is a general feeling of tingling that is all over his foot. It's less when he's standing and has been pretty constant. He's concerned this may be diabetes related because his mother had diabetes. He reports he's never had issues with high blood sugars in the past and has never been diagnosed with diabetes or prediabetes in the past.  He has hypercholesterolemia and is statin intolerant. He is not taking any medications for this currently.  The patient has a history of GERD. He takes pepcid often for these issues and doesn't have any complaints or concerns about this.  For his hypertension, he takes amlodipine, HCTZ, metoprolol, and ramipril. He hasn't had any issues with this and takes his medications as prescribed.  The patient reports a history of gout and kidney stones. He takes allopurinol for this as prescribed. He has  not had any gout flares or kidney stones in several years. He doesn't have any concerns about this.  The patient takes alfuzosin for his BPH. He is not having any issues or symptoms from this and is being followed by Dr. Lillia Dallas for these issues.  He has a history of colonic polyps during his last colonoscopy in 1999. He reports they were benign. He does not want to get a colonoscopy, but is willing to try other screening options. He reports no blood in his stool, no changes in stool consistency, and no black/tarry stools.  His social history was reviewed. The patient is retired and likes to garden and write in his free time. He feels safe at home. His family history was reviewed and is significant for prostate cancer in his father and diabetes in his mother. His screenings and immunizations were reviewed. He does not use tobacco products, has 1 drink of alcohol a week and does not use any recreational drugs. His diet is unchanged and he does not exercise.   Review of Systems Pertinent positives and negatives included above.    Objective:   Physical Exam Patient alert and in no distress. Heart was regular rate and rhythm Lungs were clear to auscultation bilaterally. Foot exam showed no wounds. Sensation was intact with 8/8 microfilament testing.     Assessment and Plan:     History of renal stone - Plan: CBC with Differential/Platelet, Comprehensive metabolic panel  Screening for colon cancer - Plan: Cologuard  History of gout - Plan:  Uric Acid  ASHD (arteriosclerotic heart disease) - Plan: CBC with Differential/Platelet, Comprehensive metabolic panel, Lipid panel  Hypercholesterolemia - Plan: Lipid panel  hypertension - Plan: CBC with Differential/Platelet, Comprehensive metabolic panel  Gastroesophageal reflux disease without esophagitis  Benign prostatic hyperplasia with lower urinary tract symptoms, symptom details unspecified  Need for hepatitis C screening test - Plan:  Hepatitis C antibody  1. Renal stone history and gout history: We will get a CBC, CMP, and Uric acid to evaluate whether his allopurinol should be adjusted and if his renal function is good. We encouraged him to continue with his allopurinol as prescribed. 2. Colon Cancer Screening/History of Colonic Polyps: The patient hasn't gotten a colonoscopy since 1999 and doesn't want to get another one. We discussed possibly doing cologuard instead as an alternative. He was receptive to this and willing to do it. 3. ASHD: We will get a CBC, CMP, and lipid panel to evaluate this. We encouraged continued use of all of his medications as prescribed as well as a healthy diet and exercise.  4. Hypercholesterolemia: We will get a lipid panel to evaluate this. The patient is statin intolerant. We discussed possibly starting PCSK9 inhibitor (Repatha) if these labs indicate he needs it. We will have the patient follow up with another appointment once we get these labs back. 5. Hypertension: Well-controlled on current regimen of amlodipine, HCTZ, metoprolol, and ramipril. We encouraged continued use of these medications as well as diet and exercise.  6. GERD: The patient's GERD is well-controlled with pepcid. 7. BPH: Patient takes alfuzosin and is followed by Dr. Lillia Dallas for these issues. 8. Immunizations and Screenings: We encouraged doing the cologuard test for colon cancer screening. We will also do a hep C screening. We discussed the Shingrix vaccine with him as well and encouraged him to get the vaccine at his pharmacy.

## 2016-07-26 ENCOUNTER — Telehealth: Payer: Self-pay

## 2016-07-26 LAB — HEPATITIS C ANTIBODY: HCV Ab: NEGATIVE

## 2016-07-26 LAB — URIC ACID: Uric Acid, Serum: 7.9 mg/dL (ref 4.0–8.0)

## 2016-07-26 NOTE — Telephone Encounter (Signed)
Dr.lalonde wants the prior Auth started for repatha please

## 2016-08-06 ENCOUNTER — Telehealth: Payer: Self-pay | Admitting: Family Medicine

## 2016-08-06 NOTE — Telephone Encounter (Signed)
Completed paperwork and faxed to [facility] for Repatha

## 2016-08-15 NOTE — Telephone Encounter (Signed)
P.A. Approved for 6 months.  Called [facility] and cost for medication is $436.70, pt's options for assistance are  [employer] [phone] & [employer] [phone].  Left message for pt

## 2016-08-15 NOTE — Telephone Encounter (Signed)
Pt called back and he is very apprehensive about even starting the medication due to the problems he had with Statins and now with the cost of this medication, he is not wanting to try this at this time and will discuss with Dr. [name] at his next visit.

## 2016-08-15 NOTE — Telephone Encounter (Signed)
See telephone call

## 2016-08-25 ENCOUNTER — Other Ambulatory Visit: Payer: Self-pay | Admitting: Family Medicine

## 2016-09-18 DIAGNOSIS — N401 Enlarged prostate with lower urinary tract symptoms: Secondary | ICD-10-CM | POA: Diagnosis not present

## 2016-09-18 DIAGNOSIS — R972 Elevated prostate specific antigen [PSA]: Secondary | ICD-10-CM | POA: Diagnosis not present

## 2016-10-16 DIAGNOSIS — Z1211 Encounter for screening for malignant neoplasm of colon: Secondary | ICD-10-CM | POA: Diagnosis not present

## 2016-10-16 DIAGNOSIS — Z1212 Encounter for screening for malignant neoplasm of rectum: Secondary | ICD-10-CM | POA: Diagnosis not present

## 2016-10-17 LAB — COLOGUARD: Cologuard: NEGATIVE

## 2016-10-18 NOTE — Telephone Encounter (Signed)
Dr. [name] please discuss Repatha with pt at appt on Thursday and if he is agreeable there are options for Pt Assistance to help with his out of pocket costs if he is agreeable to start medication, I have printed application, please give to patient and have him complete and return to me and I will help him get the assistance to pay for medication.

## 2016-10-24 ENCOUNTER — Encounter: Payer: Self-pay | Admitting: Family Medicine

## 2016-10-24 ENCOUNTER — Ambulatory Visit (INDEPENDENT_AMBULATORY_CARE_PROVIDER_SITE_OTHER): Payer: Medicare Other | Admitting: Family Medicine

## 2016-10-24 VITALS — BP 120/70 | HR 59 | Ht 63.0 in | Wt 244.0 lb

## 2016-10-24 DIAGNOSIS — Z87442 Personal history of urinary calculi: Secondary | ICD-10-CM | POA: Diagnosis not present

## 2016-10-24 DIAGNOSIS — Z8739 Personal history of other diseases of the musculoskeletal system and connective tissue: Secondary | ICD-10-CM | POA: Diagnosis not present

## 2016-10-24 DIAGNOSIS — Z789 Other specified health status: Secondary | ICD-10-CM | POA: Diagnosis not present

## 2016-10-24 DIAGNOSIS — R05 Cough: Secondary | ICD-10-CM

## 2016-10-24 DIAGNOSIS — E7800 Pure hypercholesterolemia, unspecified: Secondary | ICD-10-CM | POA: Diagnosis not present

## 2016-10-24 DIAGNOSIS — Z23 Encounter for immunization: Secondary | ICD-10-CM

## 2016-10-24 DIAGNOSIS — N289 Disorder of kidney and ureter, unspecified: Secondary | ICD-10-CM | POA: Diagnosis not present

## 2016-10-24 DIAGNOSIS — N401 Enlarged prostate with lower urinary tract symptoms: Secondary | ICD-10-CM

## 2016-10-24 DIAGNOSIS — R053 Chronic cough: Secondary | ICD-10-CM

## 2016-10-24 DIAGNOSIS — K219 Gastro-esophageal reflux disease without esophagitis: Secondary | ICD-10-CM | POA: Diagnosis not present

## 2016-10-24 DIAGNOSIS — I2510 Atherosclerotic heart disease of native coronary artery without angina pectoris: Secondary | ICD-10-CM

## 2016-10-24 DIAGNOSIS — I10 Essential (primary) hypertension: Secondary | ICD-10-CM

## 2016-10-24 NOTE — Progress Notes (Signed)
Subjective:   HPI  Chase FortJames I Harbuck Jr. is a 71 y.o. male who presents for Chief Complaint  Patient presents with  . Medicare Wellness  . Annual Exam    Medical care team includes: [name], [name], MD here for primary care  Dr.Tannenbaum Dr [place] Preventative care: [name] Last ophthalmology visit:  5/18 Last dental visit: 5/18 Last colonoscopy: cologuard  Last prostate exam: 6/18 Last EKG:10/27/12 Last labs:07/25/16  Prior vaccinations:  TD or Tdap: 01/06/07 Influenza:12/31/15 Pneumococcal: 23 10/06/02,02/25/12 Shingles/Zostavax:10/06/02   Advanced directive: Yes copy asked for Health care power of attorney: Living will:  Concerns:  He is statin intolerant and does have underlying cardiac disease. He has concerns about the Repatha especially getting shots and its cost. He has not seen cardiology in several years but is having no chest pain, shortness of breath, PND, DOE, peripheral edema. He has not had any difficulty with renal stones or gout. He does continue to complain of a chronic cough. He apparently did have URI symptoms for the cough has intermittently continued. Continues on Altace. He has been seen by urology and is Uroxatral was continued.  Reviewed their medical, surgical, family, social, medication, and allergy history and updated chart as appropriate.  Past Medical History:  Diagnosis Date  . ASHD (arteriosclerotic heart disease)   . Chronic kidney disease    RENAL STONES  . Diverticulitis   . GERD (gastroesophageal reflux disease)   . Hx of colonic polyps   . Hypertension   . Obesity   . PUD (peptic ulcer disease)   . Renal insufficiency     Past Surgical History:  Procedure Laterality Date  . CARDIAC CATHETERIZATION  08/2005  . NASAL ENDOSCOPY  2008    Social History   Social History  . Marital status: Single    Spouse name: N/A  . Number of children: N/A  . Years of education: N/A   Occupational History  . Not on file.   Social  History Main Topics  . Smoking status: Never Smoker  . Smokeless tobacco: Never Used  . Alcohol use Yes  . Drug use: No  . Sexual activity: Not on file   Other Topics Concern  . Not on file   Social History Narrative  . No narrative on file    No family history on file.   Current Outpatient Prescriptions:  .  albuterol (PROVENTIL HFA;VENTOLIN HFA) 108 (90 Base) MCG/ACT inhaler, Inhale 2 puffs into the lungs every 6 (six) hours as needed for wheezing or shortness of breath. (Patient not taking: Reported on 07/25/2016), Disp: 1 Inhaler, Rfl: 0 .  alfuzosin (UROXATRAL) 10 MG 24 hr tablet, Take 10 mg by mouth daily.  , Disp: , Rfl:  .  allopurinol (ZYLOPRIM) 300 MG tablet, TAKE 1 TABLET BY MOUTH DAILY, Disp: 90 tablet, Rfl: 1 .  amLODipine (NORVASC) 5 MG tablet, Take 1 tablet (5 mg total) by mouth daily., Disp: 90 tablet, Rfl: 3 .  amLODipine (NORVASC) 5 MG tablet, TAKE 1 TABLET BY MOUTH EVERY DAY, Disp: 90 tablet, Rfl: 0 .  hydrochlorothiazide (MICROZIDE) 12.5 MG capsule, TAKE ONE CAPSULE BY MOUTH EVERY DAY, Disp: 90 capsule, Rfl: 0 .  metoprolol succinate (TOPROL-XL) 50 MG 24 hr tablet, Take 1 tablet (50 mg total) by mouth daily. Take with or immediately following a meal., Disp: 90 tablet, Rfl: 3 .  metoprolol succinate (TOPROL-XL) 50 MG 24 hr tablet, TAKE 1 TABLET BY MOUTH EVERY DAY TAKE WITH OR IMMEDIATELY FOLLOWING A MEAL, Disp:  90 tablet, Rfl: 0 .  ramipril (ALTACE) 10 MG capsule, TAKE ONE CAPSULE BY MOUTH EVERY DAY, Disp: 90 capsule, Rfl: 0  No Known Allergies     Review of Systems    Objective:   General appearance: alert, no distress, WD/WN, Caucasian male Skin: Normal HEENT: normocephalic, conjunctiva/corneas normal, sclerae anicteric, PERRLA, EOMi, nares patent, no discharge or erythema, pharynx normal Oral cavity: MMM, tongue normal, teeth normal Neck: supple, no lymphadenopathy, no thyromegaly, no masses, normal ROM, no bruits Chest: non tender, normal shape and  expansion Heart: RRR, normal S1, S2, no murmurs Lungs: CTA bilaterally, no wheezes, rhonchi, or rales Abdomen: +bs, soft, non tender, non distended, no masses, no hepatomegaly, no splenomegaly, no bruits Musculoskeletal: upper extremities non tender, no obvious deformity, normal ROM throughout, lower extremities non tender, no obvious deformity, normal ROM throughout Extremities: no edema, no cyanosis, no clubbing Pulses: 2+ symmetric, upper and lower extremities, normal cap refill Neurological: alert, oriented x 3, CN2-12 intact, strength normal upper extremities and lower extremities, sensation normal throughout, DTRs 2+ throughout, no cerebellar signs, gait normal   Assessment and Plan :    Need for vaccination against Streptococcus pneumoniae - Plan: Pneumococcal conjugate vaccine 13-valent  History of renal stone  History of gout  ASHD (arteriosclerotic heart disease)  Hypercholesterolemia  hypertension  Gastroesophageal reflux disease without esophagitis  Benign prostatic hyperplasia with lower urinary tract symptoms, symptom details unspecified  Chronic cough  Renal insufficiency - Plan: Basic Metabolic Panel  Statin intolerance Discussed at length the need for him to get on Repatha. Will also have him stop his Altace for 1 week and let me know how he is doing. He will continue on his other medications. Discussed possibly seeing cardiology but he did not seem interested in doing this. Also recommend that he get the new shingles vaccine and  TDaP

## 2016-10-25 ENCOUNTER — Encounter: Payer: Self-pay | Admitting: Family Medicine

## 2016-10-25 LAB — BASIC METABOLIC PANEL
BUN: 33 mg/dL — ABNORMAL HIGH (ref 7–25)
CO2: 17 mmol/L — ABNORMAL LOW (ref 20–31)
Calcium: 9.8 mg/dL (ref 8.6–10.3)
Chloride: 107 mmol/L (ref 98–110)
Creat: 1.81 mg/dL — ABNORMAL HIGH (ref 0.70–1.25)
Glucose, Bld: 83 mg/dL (ref 65–99)
Potassium: 4.5 mmol/L (ref 3.5–5.3)
Sodium: 142 mmol/L (ref 135–146)

## 2016-10-31 NOTE — Telephone Encounter (Signed)
Left message for pt, to follow up on Repatha Pt Assistance forms

## 2016-11-18 NOTE — Telephone Encounter (Signed)
[employer] Ass[employer] application completed and faxed

## 2016-12-21 ENCOUNTER — Other Ambulatory Visit: Payer: Self-pay | Admitting: Family Medicine

## 2017-01-08 NOTE — Telephone Encounter (Signed)
Called [employer] regarding Pt Assistance application & was informed pt didn't qualify until he applies for LIS (low income subsidy) & Medicaid.  States they already mailed pt a letter.  Called pt and left message

## 2017-02-15 ENCOUNTER — Other Ambulatory Visit: Payer: Self-pay | Admitting: Family Medicine

## 2017-02-15 DIAGNOSIS — Z8739 Personal history of other diseases of the musculoskeletal system and connective tissue: Secondary | ICD-10-CM

## 2017-05-13 ENCOUNTER — Other Ambulatory Visit: Payer: Self-pay | Admitting: Family Medicine

## 2017-05-14 ENCOUNTER — Telehealth: Payer: Self-pay | Admitting: Family Medicine

## 2017-05-14 NOTE — Telephone Encounter (Signed)
Called and l/m for pt to call us back to set up an appt 

## 2017-05-14 NOTE — Telephone Encounter (Signed)
Pt called and made a medcheck appt for Feb the 14th if we could give a 30 day supply for his amlodipine, ramipril, hydrochlorpthiazide,metoprolo succinate, [id number] [place], [place] - [address] pt can be reached at [phone]

## 2017-05-15 NOTE — Telephone Encounter (Signed)
Pt was called to let him know that [facility]. No answer but left a vmail. Thanks [employer]iveKH

## 2017-05-22 ENCOUNTER — Ambulatory Visit (INDEPENDENT_AMBULATORY_CARE_PROVIDER_SITE_OTHER): Payer: Medicare Other | Admitting: Family Medicine

## 2017-05-22 ENCOUNTER — Encounter: Payer: Self-pay | Admitting: Family Medicine

## 2017-05-22 VITALS — BP 128/80 | HR 78 | Wt 244.2 lb

## 2017-05-22 DIAGNOSIS — Z789 Other specified health status: Secondary | ICD-10-CM

## 2017-05-22 DIAGNOSIS — N401 Enlarged prostate with lower urinary tract symptoms: Secondary | ICD-10-CM

## 2017-05-22 DIAGNOSIS — Z8739 Personal history of other diseases of the musculoskeletal system and connective tissue: Secondary | ICD-10-CM

## 2017-05-22 DIAGNOSIS — I2510 Atherosclerotic heart disease of native coronary artery without angina pectoris: Secondary | ICD-10-CM | POA: Diagnosis not present

## 2017-05-22 DIAGNOSIS — N289 Disorder of kidney and ureter, unspecified: Secondary | ICD-10-CM | POA: Diagnosis not present

## 2017-05-22 DIAGNOSIS — Z86010 Personal history of colonic polyps: Secondary | ICD-10-CM | POA: Diagnosis not present

## 2017-05-22 NOTE — Progress Notes (Signed)
   Subjective:    Patient ID: Chase FortJames I Rademaker Jr., male    DOB: 1946-01-02, 72 y.o.   MRN: [id number]  HPI He is here for a med check appointment.  He has had some difficulty  over the last 10 days with right upper quadrant pain.  He think this is diverticulitis related and from the fact that he ate popcorn.  He has had no nausea, vomiting, diarrhea, fever or chills.  He does have a history of colonic polyps.  He continues on medications listed in the chart and is having no difficulty with them.  He has seen urology in the ER giving him Uroxatral which is helping with his bladder related symptoms. he does have underlying ASHD as well as statin intolerance.  Apparently he did not qualify for REPATHA.  He also has a history of renal insufficiency.   Review of Systems     Objective:   Physical Exam Alert and in no distress. Tympanic membranes and canals are normal. Pharyngeal area is normal. Neck is supple without adenopathy or thyromegaly. Cardiac exam shows a regular sinus rhythm without murmurs or gallops. Lungs are clear to auscultation.       Assessment & Plan:  ASHD (arteriosclerotic heart disease)  History of colonic polyps  Renal insufficiency - Plan: Comprehensive metabolic panel  Statin intolerance  Benign prostatic hyperplasia with lower urinary tract symptoms, symptom details unspecified  History of gout - Plan: Uric Acid I will follow-up after blood work concerning further intervention concerning his renal function.  We will also recheck him getting on Repatha.

## 2017-05-23 LAB — COMPREHENSIVE METABOLIC PANEL
ALT: 18 IU/L (ref 0–44)
AST: 17 IU/L (ref 0–40)
Albumin/Globulin Ratio: 1.5 (ref 1.2–2.2)
Albumin: 4.4 g/dL (ref 3.6–4.8)
Alkaline Phosphatase: 66 IU/L (ref 39–117)
BUN/Creatinine Ratio: 14 (ref 10–24)
BUN: 22 mg/dL (ref 8–27)
Bilirubin Total: 0.5 mg/dL (ref 0.0–1.2)
CO2: 16 mmol/L — ABNORMAL LOW (ref 20–29)
Calcium: 10.4 mg/dL — ABNORMAL HIGH (ref 8.6–10.2)
Chloride: 100 mmol/L (ref 96–106)
Creatinine, Ser: 1.6 mg/dL — ABNORMAL HIGH (ref 0.76–1.27)
GFR calc Af Amer: 50 mL/min/{1.73_m2} — ABNORMAL LOW (ref >59)
GFR calc non Af Amer: 43 mL/min/{1.73_m2} — ABNORMAL LOW (ref >59)
Globulin, Total: 2.9 g/dL (ref 1.5–4.5)
Glucose: 85 mg/dL (ref 65–99)
Potassium: 4.8 mmol/L (ref 3.5–5.2)
Sodium: 143 mmol/L (ref 134–144)
Total Protein: 7.3 g/dL (ref 6.0–8.5)

## 2017-05-23 LAB — URIC ACID: Uric Acid: 5.7 mg/dL (ref 3.7–8.6)

## 2017-06-02 ENCOUNTER — Telehealth: Payer: Self-pay

## 2017-06-02 ENCOUNTER — Ambulatory Visit (INDEPENDENT_AMBULATORY_CARE_PROVIDER_SITE_OTHER): Payer: Medicare Other | Admitting: Family Medicine

## 2017-06-02 ENCOUNTER — Encounter: Payer: Self-pay | Admitting: Family Medicine

## 2017-06-02 VITALS — BP 112/68 | HR 74 | Wt 237.0 lb

## 2017-06-02 DIAGNOSIS — R109 Unspecified abdominal pain: Secondary | ICD-10-CM

## 2017-06-02 DIAGNOSIS — R197 Diarrhea, unspecified: Secondary | ICD-10-CM | POA: Diagnosis not present

## 2017-06-02 DIAGNOSIS — R634 Abnormal weight loss: Secondary | ICD-10-CM

## 2017-06-02 NOTE — Progress Notes (Signed)
   Subjective:    Patient ID: Chase Wright., male    DOB: October 08, 1945, 72 y.o.   MRN: [id number]  HPI He is here for evaluation of continued difficulty with abdominal pain.  He has had no nausea or vomiting but does complain of bloating, anorexia as well as weight loss.  He states that food has no effect on this.   Review of Systems     Objective:   Physical Exam Alert and complaining of pain.  Bowel sounds are hypoactive.  Negative Murphy sign and Murphy's punch.  Tender to palpation with questionable rebound in the right mid quadrant.       Assessment & Plan:  Diarrhea, unspecified type - Plan: CT ABDOMEN PELVIS W WO CONTRAST  Abdominal pain, unspecified abdominal location - Plan: CT ABDOMEN PELVIS W WO CONTRAST  Weight loss - Plan: CT ABDOMEN PELVIS W WO CONTRAST

## 2017-06-02 NOTE — Telephone Encounter (Signed)
Ct was set up for pt at GI . Appt is march 3rd , 2019 at 10:10 am pt is aware.Thanks [employer]

## 2017-06-09 ENCOUNTER — Other Ambulatory Visit: Payer: Self-pay

## 2017-06-09 ENCOUNTER — Telehealth: Payer: Self-pay | Admitting: Family Medicine

## 2017-06-09 DIAGNOSIS — R1084 Generalized abdominal pain: Secondary | ICD-10-CM

## 2017-06-09 DIAGNOSIS — R1030 Lower abdominal pain, unspecified: Secondary | ICD-10-CM

## 2017-06-09 NOTE — Telephone Encounter (Signed)
Follow up  Albin FellingCarla verbalized also wanting to know the patient's mobility status due to them not having a lift at the office.  Please f/u with Albin Fellingarla

## 2017-06-09 NOTE — Telephone Encounter (Signed)
Done kh

## 2017-06-09 NOTE — Telephone Encounter (Signed)
New Message  Albin FellingCarla verbalized order needs to be changed to order abdomin pelvis with contrast and not without.

## 2017-06-10 ENCOUNTER — Ambulatory Visit
Admission: RE | Admit: 2017-06-10 | Discharge: 2017-06-10 | Disposition: A | Discharge status: Home / Self Care | Payer: Medicare Other | Source: Ambulatory Visit | Attending: Family Medicine | Admitting: Family Medicine

## 2017-06-10 DIAGNOSIS — M1990 Unspecified osteoarthritis, unspecified site: Secondary | ICD-10-CM

## 2017-06-10 DIAGNOSIS — I700 Atherosclerosis of aorta: Secondary | ICD-10-CM

## 2017-06-10 DIAGNOSIS — R1030 Lower abdominal pain, unspecified: Secondary | ICD-10-CM

## 2017-06-10 DIAGNOSIS — R109 Unspecified abdominal pain: Secondary | ICD-10-CM | POA: Diagnosis not present

## 2017-06-10 DIAGNOSIS — R1084 Generalized abdominal pain: Secondary | ICD-10-CM

## 2017-06-10 MED ORDER — IOPAMIDOL (ISOVUE-300) INJECTION 61%
100.0000 mL | Freq: Once | INTRAVENOUS | Status: AC | PRN
  Administered 2017-06-10: 100 mL via INTRAVENOUS

## 2017-06-12 ENCOUNTER — Other Ambulatory Visit: Payer: Self-pay

## 2017-06-12 DIAGNOSIS — I700 Atherosclerosis of aorta: Secondary | ICD-10-CM

## 2017-06-25 ENCOUNTER — Encounter: Payer: Self-pay | Admitting: Nurse Practitioner

## 2017-06-25 ENCOUNTER — Ambulatory Visit: Payer: Medicare Other | Admitting: Nurse Practitioner

## 2017-06-25 VITALS — BP 114/64 | HR 66 | Ht 66.0 in | Wt 239.8 lb

## 2017-06-25 DIAGNOSIS — R634 Abnormal weight loss: Secondary | ICD-10-CM | POA: Diagnosis not present

## 2017-06-25 DIAGNOSIS — R109 Unspecified abdominal pain: Secondary | ICD-10-CM

## 2017-06-25 DIAGNOSIS — R194 Change in bowel habit: Secondary | ICD-10-CM

## 2017-06-25 MED ORDER — LIDOCAINE 5 % EX PTCH: MEDICATED_PATCH | CUTANEOUS | 0 refills | Status: DC

## 2017-06-25 NOTE — Patient Instructions (Signed)
If you are age 865 or older, your body mass index should be between 23-30. Your Body mass index is 38.7 kg/m. If this is out of the aforementioned range listed, please consider follow up with your Primary Care Provider.  If you are age 70 or younger, your body mass index should be between 19-25. Your Body mass index is 38.7 kg/m. If this is out of the aformentioned range listed, please consider follow up with your Primary Care Provider.   We have sent the following medications to your pharmacy for you to pick up at your convenience: Lidoderm Patches- One 12 hours on then 12 hours off then repeat with new patch.  Thank you for choosing [facility]  Willette ClusterPaula Guenther, NP

## 2017-06-25 NOTE — Progress Notes (Signed)
Chief Complaint: right sided abdominal pain   Referring Provider:  Dr. [name]     Reason for referral: abdominal pain and weight loss.    ASSESSMENT AND PLAN;   1. 72 yo male with two month history of burning right mid abdominal pain. Overall I think pain is musculoskeletal or neuropathic in nature. It definitely has a positional component but for uncertain reasons seems worse on an empty stomach and he has had some minor intermittent bowel changes recently. He has lost 6 pounds, not overly concerning at this point.  -CT scan with contrast unrevealing. No liver or gb  Abnormalities noted. Of note he had a CT scan with contrast in Sept 2008 for RUQ which was unrevealing. He also had a non-contrast CT scan in 2005 for RUQ which was unrevealing.  -CBC, CMP unrevealing.  -Trial of lidoderm patches. If insurance will not pay then maybe topical Diclofenac. -He should touch base with us within next couple of weeks with condition update (or sooner if sx worsen). If Lidoderm patches don't help and he continues to lose weight / have bowel changes then further workup will be required.   2.  Colon cancer screening.  - Patient reports normal Cologuard this past fall per patient.  He has no rectal bleeding or anemia but has had some bowel changes so that coupled with some weight loss gives me a low threshold for proceeding with a colonoscopy.   HPI:     Patient is a 72 year old male with HTN, obesity, gout, CKD, and GERD. He is apparenlty former patient of Dr. Arlyce DiceKaplan (I can't find records),  referred by PCP Dr. [name] for abdominal pain, diarrhea, and weight loss of 6 pounds since mid Feb.   Patient started eating popcorn 2 months ago and within a week he developed right mid abdominal pain . He was concerned about diverticulitis so saw PCP who ordered labs and CTscan.  There was diverticular disease of the rectosigmoid colon but no diverticulitis.  CBC and liver chemistries were unremarkable. He  does have CKD,  creatinine was at baseline around 1.6. Patient also inquires about PUD. He gives a very remote hx of PUD on NSAIDS (Dr. Elnoria HowardHung). He also has GERD, asymptomatic on chronic pepcid BID.     Pain is a constant burning but not excruciating. It is worse on an empty stomach but also has a positional component. Hurts worse when standing up after sitting on a hard chair, more so than when standing up after sitting in a soft chair.  Pain not associated with nausea. He is urinating fine, no hematuria. He gives a hx of a "muscle" pain in back which resolved with a cortisone shot and wonders if current pain is also muscular.   Mr. Fredirick LatheColasanti also mentions that stools have been intermittently loose over the last month. Having solid stools as well.  No blood in BMs. No recent antibiotics or medication changes. Says he had a negative Cologard in the Fall   Past Medical History:  Diagnosis Date  . ASHD (arteriosclerotic heart disease)   . Chronic kidney disease    RENAL STONES  . Diverticulitis   . GERD (gastroesophageal reflux disease)   . Hx of colonic polyps   . Hypertension   . Obesity   . PUD (peptic ulcer disease)   . Renal insufficiency      Past Surgical History:  Procedure Laterality Date  . CARDIAC CATHETERIZATION  08/2005  . NASAL ENDOSCOPY  2008  Family History  Problem Relation Age of Onset  . Diabetes Mother   . Prostate cancer Father 53  . Colon cancer Neg Hx    Social History   Tobacco Use  . Smoking status: Never Smoker  . Smokeless tobacco: Never Used  Substance Use Topics  . Alcohol use: Yes  . Drug use: No   Current Outpatient Medications  Medication Sig Dispense Refill  . famotidine (PEPCID) 10 MG tablet Take 10 mg by mouth 2 (two) times daily.    Marland Kitchen alfuzosin (UROXATRAL) 10 MG 24 hr tablet Take 10 mg by mouth daily.      Marland Kitchen allopurinol (ZYLOPRIM) 300 MG tablet TAKE 1 TABLET BY MOUTH DAILY 90 tablet 1  . amLODipine (NORVASC) 5 MG tablet TAKE 1 TABLET BY  MOUTH EVERY DAY 90 tablet 0  . hydrochlorothiazide (MICROZIDE) 12.5 MG capsule TAKE 1 CAPSULE BY MOUTH EVERY DAY 90 capsule 0  . metoprolol succinate (TOPROL-XL) 50 MG 24 hr tablet Take 1 tablet (50 mg total) by mouth daily. Take with or immediately following a meal. 90 tablet 3  . ramipril (ALTACE) 10 MG capsule TAKE 1 CAPSULE BY MOUTH EVERY DAY 90 capsule 0   No current facility-administered medications for this visit.    No Known Allergies   Review of Systems: All systems reviewed and negative except where noted in HPI.    Physical Exam:    Ht 5\' 6"  (1.676 m)   Wt 239 lb 12.8 oz (108.8 kg)   BMI 38.70 kg/m  Constitutional:  Well-developed, white male in no acute distress. Psychiatric: Normal mood and affect. Behavior is normal. EENT: Pupils normal.  Conjunctivae are normal. No scleral icterus. Neck supple.  Cardiovascular: Normal rate, regular rhythm. No edema Pulmonary/chest: Effort normal and breath sounds normal. No wheezing, rales or rhonchi. Abdominal: Soft, nondistended. Nontender. Bowel sounds active throughout. There are no masses palpable. No hepatomegaly. Neurological: Alert and oriented to person place and time. Skin: Skin is warm and dry. No rashes noted.  Willette Cluster, NP  06/25/2017, 2:37 PM  Cc: Ronnald Nian, MD

## 2017-06-26 ENCOUNTER — Encounter: Payer: Self-pay | Admitting: Nurse Practitioner

## 2017-06-26 ENCOUNTER — Telehealth: Payer: Self-pay | Admitting: Nurse Practitioner

## 2017-06-26 NOTE — Telephone Encounter (Signed)
Just have him follow package directions?

## 2017-07-01 ENCOUNTER — Other Ambulatory Visit: Payer: Self-pay

## 2017-07-01 MED ORDER — DICLOFENAC SODIUM 1 % TD GEL: 2.0000 g | Freq: Three times a day (TID) | TRANSDERMAL | 1 refills | Status: DC | PRN

## 2017-07-01 NOTE — Telephone Encounter (Signed)
Beth, patches are Rx only so lets try voltaren gel  2grams to area TID. Thanks

## 2017-07-01 NOTE — Progress Notes (Signed)
Nurse practitioner Assessment and plan reviewed

## 2017-07-01 NOTE — Telephone Encounter (Signed)
Voltaren gel is not covered either. Spoke with the patient. He said he is actually doing better. The Maximum Strength Salonpas patches seem to have helped.  He will keep the follow up appointment for now.

## 2017-07-01 NOTE — Telephone Encounter (Signed)
Left message with this information. [facility] if this is covered by his insurance.

## 2017-07-08 ENCOUNTER — Ambulatory Visit: Payer: Medicare Other | Admitting: Nurse Practitioner

## 2017-08-05 ENCOUNTER — Other Ambulatory Visit: Payer: Self-pay | Admitting: Family Medicine

## 2017-10-29 ENCOUNTER — Encounter: Payer: Self-pay | Admitting: *Deleted

## 2017-11-04 ENCOUNTER — Ambulatory Visit (INDEPENDENT_AMBULATORY_CARE_PROVIDER_SITE_OTHER): Payer: Medicare Other | Admitting: Family Medicine

## 2017-11-04 ENCOUNTER — Encounter: Payer: Self-pay | Admitting: Family Medicine

## 2017-11-04 VITALS — BP 122/82 | HR 57 | Temp 98.3°F | Ht 64.0 in | Wt 240.2 lb

## 2017-11-04 DIAGNOSIS — N401 Enlarged prostate with lower urinary tract symptoms: Secondary | ICD-10-CM

## 2017-11-04 DIAGNOSIS — Z789 Other specified health status: Secondary | ICD-10-CM | POA: Diagnosis not present

## 2017-11-04 DIAGNOSIS — M1990 Unspecified osteoarthritis, unspecified site: Secondary | ICD-10-CM

## 2017-11-04 DIAGNOSIS — I700 Atherosclerosis of aorta: Secondary | ICD-10-CM

## 2017-11-04 DIAGNOSIS — I10 Essential (primary) hypertension: Secondary | ICD-10-CM | POA: Diagnosis not present

## 2017-11-04 DIAGNOSIS — Z87442 Personal history of urinary calculi: Secondary | ICD-10-CM

## 2017-11-04 DIAGNOSIS — E785 Hyperlipidemia, unspecified: Secondary | ICD-10-CM | POA: Diagnosis not present

## 2017-11-04 DIAGNOSIS — Z0000 Encounter for general adult medical examination without abnormal findings: Secondary | ICD-10-CM | POA: Diagnosis not present

## 2017-11-04 DIAGNOSIS — N289 Disorder of kidney and ureter, unspecified: Secondary | ICD-10-CM

## 2017-11-04 DIAGNOSIS — I2510 Atherosclerotic heart disease of native coronary artery without angina pectoris: Secondary | ICD-10-CM

## 2017-11-04 DIAGNOSIS — Z86010 Personal history of colonic polyps: Secondary | ICD-10-CM

## 2017-11-04 DIAGNOSIS — K219 Gastro-esophageal reflux disease without esophagitis: Secondary | ICD-10-CM

## 2017-11-04 NOTE — Progress Notes (Signed)
Chase Fort. is a 72 y.o. male who presents for annual wellness visit and follow-up on chronic medical conditions.  He has the following concerns: He continues to complain of right-sided abdominal pain.  He was seen by GI and they felt that it was musculoskeletal.  He does state that patches as well as BC powders work to take this right mid to upper quadrant pain away.  He is having no chest pain, shortness of breath, PND.  He does have a history of statin intolerance.  Continues on his blood pressure medications.  Does have a history of renal insufficiency as well as renal stones.  The arthritis seems to be bothering him very little.  He does have BPH and has been seen in the past by urology.  Presently he is on Uroxatrol for that.  He also continues on allopurinol for his gout.  Blood pressure medications are not causing any trouble.  Pap since he is working for his reflux. His partner of over 40 years is now on a heart failure clinic and he is quite concerned over his survival.   Immunizations and Health Maintenance Immunization History  Administered Date(s) Administered  . DTaP 07/24/1992  . Influenza Split 01/06/2012, 01/17/2013, 02/22/2014  . Influenza Whole 01/19/1991, 02/11/2006, 12/31/2007  . Influenza, High Dose Seasonal PF 12/09/2013  . Influenza-Unspecified 01/06/2015, 12/31/2015, 11/20/2016  . Pneumococcal Conjugate-13 10/24/2016  . Pneumococcal Polysaccharide-23 10/06/2002, 02/25/2012  . Tdap 01/06/2007, 11/20/2016  . Zoster 10/06/2002  . Zoster Recombinat (Shingrix) 11/20/2016, 05/13/2017   Health Maintenance Due  Topic Date Due  . PNA vac Low Risk Adult (2 of 2 - PPSV23) 10/24/2017    Last colonoscopy:cologuard  Last ZOX:WRUE year  Dentist:may 2019 Ophtho:over year pt will be making appt soon Exercise:GYm walk a mile qod  Other doctors caring for patient include:Hung.[place].Gerhardt  Advanced Directives: Yes.    Depression screen:  See questionnaire below.      Depression screen [facility] [place] 2/9 11/04/2017 10/24/2016 07/25/2016 02/23/2013  Decreased Interest 0 0 0 0  Down, Depressed, Hopeless 0 0 0 0  PHQ - 2 Score 0 0 0 0    Fall Screen: See Questionaire below.   Fall Risk  10/24/2016 07/25/2016 05/26/2015 02/23/2013  Falls in the past year? No No Yes No  Injury with Fall? - - Yes -    ADL screen:  See questionnaire below.  Functional Status Survey: Is the patient deaf or have difficulty hearing?: No Does the patient have difficulty seeing, even when wearing glasses/contacts?: No Does the patient have difficulty concentrating, remembering, or making decisions?: No Does the patient have difficulty walking or climbing stairs?: No Does the patient have difficulty dressing or bathing?: No Does the patient have difficulty doing errands alone such as visiting a doctor's office or shopping?: No   Review of Systems  Constitutional: -, -unexpected weight change, -anorexia, -fatigue Allergy: -sneezing, -itching, -congestion Dermatology: denies changing moles, rash, lumps ENT: -runny nose, -ear pain, -sore throat,  Cardiology:  -chest pain, -palpitations, -orthopnea, Respiratory: -cough, -shortness of breath, -dyspnea on exertion, -wheezing,  Gastroenterology: -abdominal pain, -nausea, -vomiting, -diarrhea, -constipation, -dysphagia Hematology: -bleeding or bruising problems Musculoskeletal: -arthralgias, -myalgias, -joint swelling, -back pain, - Ophthalmology: -vision changes,  Urology: -dysuria, -difficulty urinating,  -urinary frequency, -urgency, incontinence Neurology: -, -numbness, , -memory loss, -falls, -dizziness    PHYSICAL EXAM:   General Appearance: Alert, cooperative, no distress, appears stated age Head: Normocephalic, without obvious abnormality, atraumatic Eyes: PERRL, conjunctiva/corneas clear, EOM's intact, fundi benign Ears:  Normal TM's and external ear canals Nose: Nares normal, mucosa normal, no drainage or sinus    tenderness Throat: Lips, mucosa, and tongue normal; teeth and gums normal Neck: Supple, no lymphadenopathy, thyroid:no enlargement/tenderness/nodules; no carotid bruit or JVD Lungs: Clear to auscultation bilaterally without wheezes, rales or ronchi; respirations unlabored Heart: Regular rate and rhythm, S1 and S2 normal, no murmur, rub or gallop Abdomen: Soft, non-tender, nondistended, normoactive bowel sounds, no masses, no hepatosplenomegaly Extremities: No clubbing, cyanosis or edema Pulses: 2+ and symmetric all extremities Skin: Skin color, texture, turgor normal, no rashes or lesions Lymph nodes: Cervical, supraclavicular, and axillary nodes normal Neurologic: CNII-XII intact, normal strength, sensation and gait; reflexes 2+ and symmetric throughout   Psych: Normal mood, affect, hygiene and grooming  ASSESSMENT/PLAN: Routine general medical examination at a health care facility - Plan: CBC with Differential/Platelet, Lipid panel  Abdominal aortic atherosclerosis (HCC)  ASHD (arteriosclerotic heart disease) - Plan: CBC with Differential/Platelet, Lipid panel  Statin intolerance - Plan: Lipid panel  Essential hypertension - Plan: CBC with Differential/Platelet  Renal insufficiency  History of renal stone  Arthritis  Benign prostatic hyperplasia with lower urinary tract symptoms, symptom details unspecified  Gastroesophageal reflux disease without esophagitis  History of colonic polyps  Hyperlipidemia, unspecified hyperlipidemia type - Plan: Lipid panel He will continue on his present medications.  We will attempt to get him on Repatha.  Encouraged him to become more physically active.  Explained that now he is considered morbidly obese.  Also discussed his relationship with his partner.  Explained that it is okay for him to be upset over the fact that his partner might die at any moment.  He will continue on his present medication regimen. Encouraged him to continue to use  patches and BC powder to help control his musculoskeletal pain.     Medicare Attestation I have personally reviewed: The patient's medical and social history Their use of alcohol, tobacco or illicit drugs Their current medications and supplements The patient's functional ability including ADLs,fall risks, home safety risks, cognitive, and hearing and visual impairment Diet and physical activities Evidence for depression or mood disorders  The patient's weight, height, and BMI have been recorded in the chart.  I have made referrals, counseling, and provided education to the patient based on review of the above and I have provided the patient with a written personalized care plan for preventive services.     Sharlot GowdaJohn Lalonde, MD   11/04/2017

## 2017-11-05 LAB — CBC WITH DIFFERENTIAL/PLATELET
Basophils Absolute: 0.1 10*3/uL (ref 0.0–0.2)
Basos: 1 %
EOS (ABSOLUTE): 0.5 10*3/uL — ABNORMAL HIGH (ref 0.0–0.4)
Eos: 6 %
Hematocrit: 45.3 % (ref 37.5–51.0)
Hemoglobin: 15.5 g/dL (ref 13.0–17.7)
Immature Grans (Abs): 0.1 10*3/uL (ref 0.0–0.1)
Immature Granulocytes: 1 %
Lymphocytes Absolute: 3.4 10*3/uL — ABNORMAL HIGH (ref 0.7–3.1)
Lymphs: 37 %
MCH: 31.8 pg (ref 26.6–33.0)
MCHC: 34.2 g/dL (ref 31.5–35.7)
MCV: 93 fL (ref 79–97)
Monocytes Absolute: 0.9 10*3/uL (ref 0.1–0.9)
Monocytes: 10 %
Neutrophils Absolute: 4.1 10*3/uL (ref 1.4–7.0)
Neutrophils: 45 %
Platelets: 281 10*3/uL (ref 150–450)
RBC: 4.88 x10E6/uL (ref 4.14–5.80)
RDW: 13.7 % (ref 12.3–15.4)
WBC: 9 10*3/uL (ref 3.4–10.8)

## 2017-11-05 LAB — LIPID PANEL
Chol/HDL Ratio: 5.5 ratio — ABNORMAL HIGH (ref 0.0–5.0)
Cholesterol, Total: 198 mg/dL (ref 100–199)
HDL: 36 mg/dL — ABNORMAL LOW (ref >39)
LDL Calculated: 131 mg/dL — ABNORMAL HIGH (ref 0–99)
Triglycerides: 156 mg/dL — ABNORMAL HIGH (ref 0–149)
VLDL Cholesterol Cal: 31 mg/dL (ref 5–40)

## 2017-11-20 ENCOUNTER — Other Ambulatory Visit: Payer: Self-pay | Admitting: Family Medicine

## 2017-11-20 DIAGNOSIS — Z8739 Personal history of other diseases of the musculoskeletal system and connective tissue: Secondary | ICD-10-CM

## 2017-12-16 ENCOUNTER — Telehealth: Payer: Self-pay | Admitting: Family Medicine

## 2017-12-16 NOTE — Telephone Encounter (Signed)
Chart notes & labs faxed to [facility] for P.A. For Repatha

## 2017-12-16 NOTE — Telephone Encounter (Signed)
Called Repatha into [facility] pharmacy & it requires P.A. They will start the process,  I will send labs and chart notes to [facility]

## 2017-12-23 NOTE — Telephone Encounter (Signed)
P.A. Approved til 06/19/18

## 2017-12-26 ENCOUNTER — Other Ambulatory Visit: Payer: Self-pay | Admitting: Family Medicine

## 2018-01-12 NOTE — Telephone Encounter (Signed)
P.A. Was approved, called [facility] and they no longer fill this must be filled at local pharmacy.  [facility] & cost is now $189 for 30 days & $477 for 90 days.  Only option that I know of for help is [employer] Assist which pt applied for before and he was denied because he has to apply for Low income Subsidy t# [phone]. Left message for pt

## 2018-01-13 ENCOUNTER — Other Ambulatory Visit: Payer: Self-pay

## 2018-01-13 NOTE — Patient Outreach (Signed)
[facility] Care Management  01/13/2018  Chase Wright. 13-Dec-1945 [id number]   Medication Adherence call to Mr. Chase Wright left a message for patient to call back patient is due on Ramipril 10 mg under [employer].   Lillia Abed CPhT Pharmacy Technician [facility] Management Direct Dial [phone]  Fax [phone] [email or web address]

## 2018-01-15 ENCOUNTER — Other Ambulatory Visit: Payer: Self-pay

## 2018-01-15 NOTE — Patient Outreach (Signed)
[facility]) Care Management  01/15/2018  Chase Wright. 08-Feb-1946 [id number]  Medication Adherence call back from Chase Wright patient call back he said he has a full bottle of Ramipril he just pick up  all [facility].    Lillia Abed CPhT Pharmacy Technician [facility] Care Management Direct Dial [phone]  Fax [phone] [email or web address]

## 2018-01-26 ENCOUNTER — Ambulatory Visit (INDEPENDENT_AMBULATORY_CARE_PROVIDER_SITE_OTHER): Payer: Medicare Other | Admitting: Family Medicine

## 2018-01-26 ENCOUNTER — Encounter: Payer: Self-pay | Admitting: Family Medicine

## 2018-01-26 VITALS — BP 132/86 | HR 77 | Temp 98.2°F | Wt 241.2 lb

## 2018-01-26 DIAGNOSIS — J069 Acute upper respiratory infection, unspecified: Secondary | ICD-10-CM | POA: Diagnosis not present

## 2018-01-26 MED ORDER — BENZONATATE 100 MG PO CAPS: 200.0000 mg | ORAL_CAPSULE | Freq: Three times a day (TID) | ORAL | 0 refills | Status: DC | PRN

## 2018-01-26 MED ORDER — AZITHROMYCIN 500 MG PO TABS: 500.0000 mg | ORAL_TABLET | Freq: Every day | ORAL | 0 refills | Status: DC

## 2018-01-26 NOTE — Progress Notes (Signed)
   Subjective:    Patient ID: Chase Fort., male    DOB: 02/07/1946, 72 y.o.   MRN: [id number]  HPI He complains of a one-week history of started with sneezing, rhinorrhea, chest congestion and coughing.  He has had some coughing paroxysms.  Is also had a fever up to 101.  No chest congestion, PND.  He has tried cough meds without much success.  He does not smoke.  He has had the flu shot.   Review of Systems     Objective:   Physical Exam Alert and in no distress. Tympanic membranes and canals are normal. Pharyngeal area is normal. Neck is supple without adenopathy or thyromegaly. Cardiac exam shows a regular sinus rhythm without murmurs or gallops. Lungs are clear to auscultation.        Assessment & Plan:  Upper respiratory tract infection, unspecified type - Plan: benzonatate (TESSALON PERLES) 100 MG capsule, azithromycin (ZITHROMAX) 500 MG tablet Recommend that he treat this symptomatically for a day or 2 and see how he does.  If his symptoms get worse, he is to go ahead and start the azithromycin and call me if not entirely better after 1 week.

## 2018-01-26 NOTE — Patient Instructions (Signed)
Go ahead and use the cough suppressant but wait a day or 2 to see if this starts to get better on its own and if it does not then go ahead and start the azithromycin.  You can also take Robitussin-DM with a cough suppressant

## 2018-02-02 ENCOUNTER — Telehealth: Payer: Self-pay | Admitting: Family Medicine

## 2018-02-02 MED ORDER — AMOXICILLIN-POT CLAVULANATE 875-125 MG PO TABS: 1.0000 | ORAL_TABLET | Freq: Two times a day (BID) | ORAL | 0 refills | Status: DC

## 2018-02-02 NOTE — Telephone Encounter (Signed)
Pt was called Chase Wright. KH

## 2018-02-02 NOTE — Telephone Encounter (Signed)
Let him know that I called a different antibiotic in and have him let me know if he is not totally back to normal when he finishes.

## 2018-02-02 NOTE — Telephone Encounter (Signed)
Pt called and states he is still coughing, the cough suppressant is not helping and the azithromycin didn't either, he would like something stonger, [id number] [place], [place] - [address] and pt can be reached at [phone]

## 2018-02-10 ENCOUNTER — Telehealth: Payer: Self-pay | Admitting: Family Medicine

## 2018-02-10 ENCOUNTER — Ambulatory Visit: Payer: Medicare Other | Admitting: Family Medicine

## 2018-02-10 MED ORDER — LEVOFLOXACIN 500 MG PO TABS: 500.0000 mg | ORAL_TABLET | Freq: Every day | ORAL | 0 refills | Status: DC

## 2018-02-10 NOTE — Telephone Encounter (Signed)
Pt called and left voicemail stating that he is still coughing and that the amoxicillin is not working he has 4 more days of it, and states that the coughing is burning now,states when he had pneumonia in 2017 he got a antibiotics then and he would like that but he as not idea what the name of it was, pt can be reached at  [phone] and [id number] [place], [place] - [address] and pt also thinks he may need a chest xray

## 2018-02-10 NOTE — Telephone Encounter (Signed)
Let him know that I called in a 10-day course of a different antibiotic and let us know when he finishes it how he is doing.

## 2018-02-10 NOTE — Telephone Encounter (Signed)
lvm of med being called in

## 2018-02-11 ENCOUNTER — Telehealth: Payer: Self-pay | Admitting: Family Medicine

## 2018-02-11 NOTE — Telephone Encounter (Signed)
Per Dr. Susann Givens ok to continue with Levofloxacin.  Randa Evens informed

## 2018-02-11 NOTE — Telephone Encounter (Signed)
[facility]ase [facility] from [facility] t[facility] [phone] called & to inform there is a drug interaction with Levofloxacin and Alfuzosin causing  QTC prolongation and pt has history of heart issues and pt is concerned.  Pt advise?

## 2018-02-24 ENCOUNTER — Telehealth: Payer: Self-pay

## 2018-02-24 DIAGNOSIS — R05 Cough: Secondary | ICD-10-CM

## 2018-02-24 DIAGNOSIS — R059 Cough, unspecified: Secondary | ICD-10-CM

## 2018-02-24 NOTE — Telephone Encounter (Signed)
Have him go by and get a chest x-ray tomorrow.

## 2018-02-24 NOTE — Telephone Encounter (Signed)
LVM for pt to go have X-ray done. KH

## 2018-02-24 NOTE — Telephone Encounter (Signed)
Pt stated he has finished the Levaquin but he still has the cough and when he coughs he has a burning sensation in his lungs. Patient is requesting an order to have a chest x-ray done.

## 2018-02-25 ENCOUNTER — Ambulatory Visit
Admission: RE | Admit: 2018-02-25 | Discharge: 2018-02-25 | Disposition: A | Discharge status: Home / Self Care | Payer: Medicare Other | Source: Ambulatory Visit | Attending: Family Medicine | Admitting: Family Medicine

## 2018-02-25 DIAGNOSIS — R05 Cough: Secondary | ICD-10-CM | POA: Diagnosis not present

## 2018-02-25 DIAGNOSIS — R059 Cough, unspecified: Secondary | ICD-10-CM

## 2018-02-26 ENCOUNTER — Other Ambulatory Visit: Payer: Self-pay

## 2018-02-26 ENCOUNTER — Telehealth: Payer: Self-pay | Admitting: Family Medicine

## 2018-02-26 ENCOUNTER — Telehealth: Payer: Self-pay

## 2018-02-26 ENCOUNTER — Other Ambulatory Visit: Payer: Self-pay | Admitting: Family Medicine

## 2018-02-26 DIAGNOSIS — R05 Cough: Secondary | ICD-10-CM

## 2018-02-26 DIAGNOSIS — J849 Interstitial pulmonary disease, unspecified: Secondary | ICD-10-CM

## 2018-02-26 DIAGNOSIS — R059 Cough, unspecified: Secondary | ICD-10-CM

## 2018-02-26 NOTE — Telephone Encounter (Signed)
Spoke to BartowJane and changed order per [employer]. Called pt and LVM for him to call office back. KH

## 2018-02-26 NOTE — Telephone Encounter (Signed)
Called pt to advise of CT scan being needed and [facility] and date. KH

## 2018-02-26 NOTE — Telephone Encounter (Signed)
Pt was called to advise of CT scan needing to be done . LVM for pt. KH

## 2018-02-26 NOTE — Telephone Encounter (Signed)
Chase SquibbJane with [facility] wants to speak with you regarding chest Xray [phone]

## 2018-02-26 NOTE — Telephone Encounter (Signed)
Called pt and LVM to call back to office. KH

## 2018-02-26 NOTE — Telephone Encounter (Signed)
Pt left message he hasn't heard on chest xray, still coughing.  Please call [phone]

## 2018-03-04 ENCOUNTER — Ambulatory Visit
Admission: RE | Admit: 2018-03-04 | Discharge: 2018-03-04 | Disposition: A | Discharge status: Home / Self Care | Payer: Medicare Other | Source: Ambulatory Visit | Attending: Family Medicine | Admitting: Family Medicine

## 2018-03-04 DIAGNOSIS — J189 Pneumonia, unspecified organism: Secondary | ICD-10-CM | POA: Diagnosis not present

## 2018-03-04 DIAGNOSIS — R059 Cough, unspecified: Secondary | ICD-10-CM

## 2018-03-04 DIAGNOSIS — R05 Cough: Secondary | ICD-10-CM

## 2018-03-04 MED ORDER — IOPAMIDOL (ISOVUE-300) INJECTION 61%
75.0000 mL | Freq: Once | INTRAVENOUS | Status: AC | PRN
  Administered 2018-03-04: 75 mL via INTRAVENOUS

## 2018-03-10 ENCOUNTER — Other Ambulatory Visit: Payer: Self-pay

## 2018-03-10 DIAGNOSIS — J849 Interstitial pulmonary disease, unspecified: Secondary | ICD-10-CM

## 2018-03-10 DIAGNOSIS — R059 Cough, unspecified: Secondary | ICD-10-CM

## 2018-03-10 DIAGNOSIS — R05 Cough: Secondary | ICD-10-CM

## 2018-04-06 ENCOUNTER — Telehealth: Payer: Self-pay

## 2018-04-06 NOTE — Telephone Encounter (Signed)
Called pt to advise of pt appt with Dr. Everardo AllEllison 04-16-18 at 9:15 for a 9:30. They are located [id number]. KH

## 2018-04-07 ENCOUNTER — Telehealth: Payer: Self-pay

## 2018-04-07 NOTE — Telephone Encounter (Signed)
Called pt back to advise of address. Of [facility] [address] and thier office number is [phone]. LVM KH

## 2018-04-16 ENCOUNTER — Ambulatory Visit (INDEPENDENT_AMBULATORY_CARE_PROVIDER_SITE_OTHER): Payer: Medicare Other | Admitting: Pulmonary Disease

## 2018-04-16 ENCOUNTER — Telehealth: Payer: Self-pay | Admitting: Pulmonary Disease

## 2018-04-16 ENCOUNTER — Encounter: Payer: Self-pay | Admitting: Pulmonary Disease

## 2018-04-16 VITALS — BP 130/64 | HR 64 | Ht 66.0 in | Wt 242.4 lb

## 2018-04-16 DIAGNOSIS — R05 Cough: Secondary | ICD-10-CM

## 2018-04-16 DIAGNOSIS — J9811 Atelectasis: Secondary | ICD-10-CM

## 2018-04-16 DIAGNOSIS — R053 Chronic cough: Secondary | ICD-10-CM

## 2018-04-16 MED ORDER — FLUTTER DEVI: 0 refills | Status: DC

## 2018-04-16 NOTE — Patient Instructions (Signed)
RML Collapse  Suspect this is secondary to atelectasis related to mucous plugging however cannot rule out endobronchial obstruction. Discussed risks and benefits of diagnostic bronchoscopy +/- biopsy.  --Will schedule patient for flexible bronchoscopy  --Flutter valve. Bedside nursing for instructions    Atelectasis, Adult  Atelectasis is a collapse of air sacs in the lungs (alveoli). The condition causes all or part of a lung to collapse. Atelectasis is a common problem after surgery. Its severity depends on the size of lung tissue area involved and the underlying cause. When severe, it can lead to shortness of breath and heart problems. Atelectasis can develop suddenly or over a long period of time. Atelectasis that develops over a long period of time (chronic atelectasis) often leads to infection, scarring, and other problems. What are the causes? This condition may be caused by:  Shallow breathing.  Medicines that make breathing more shallow.  A blockage in an airway. Blockages can result from: ? A buildup of mucus. ? A tumor. ? An inhaled object (foreign body). ? Enlarged lymph nodes. ? Fluid in the lungs (pleural effusion). ? A blood clot in the lungs.  Outside pressure on the lung. Pressure can be due to: ? A tumor. ? Fluid in the lungs (pleural effusion). ? Air leaking between the lung and rib cage (pneumothorax). ? Enlarged lymph nodes.  Improper expansion of the lungs. This may occur in newborns because of: ? Prematurity. ? Low oxygen levels. ? Secretions at birth that block the airway. ? Amniotic fluid that goes into the lungs (aspiration). What increases the risk? This condition is more likely to develop in people who:  Have an injury or health problem that makes taking deep breaths difficult or painful.  Have certain infections or diseases, such as pneumonia or cystic fibrosis.  Have had surgery on the chest or abdomen.  Have broken ribs.  Have a tight  bandage around their chest.  Have a collapsed lung due to pneumothorax.  Take medicines that decrease the rate of their breathing or how deeply they breathe, like sedatives.  Lie flat for long periods of time. What are the signs or symptoms? Often, there are no symptoms for this condition. When symptoms do appear, they may include:  Shortness of breath.  Bluish color to the nails, lips, or mouth (cyanosis).  A cough. How is this diagnosed? This condition may be diagnosed based on:  Symptoms.  A physical exam.  A chest X-ray. Sometimes specialized imaging tests are needed to diagnose the condition. How is this treated? Treatment for this condition depends on what caused the condition. Treatment may involve:  Coughing. Coughing helps loosen mucus in the airway.  Chest physiotherapy. This is a treatment to help loosen and clear mucus from the airways. It is done by clapping the chest.  Postural drainage techniques. This treatment involves positioning your body so your head is lower than your chest. It helps mucus drain from your airways.  An incentive spirometer. This is a device that is used to help with taking deeper breaths.  Positive pressure breathing. This is a form of breathing assistance in which air is forced into the lungs when you breathe in (inhale). You may have this treatment if your condition is severe.  Treatment of the underlying condition. Follow these instructions at home:  Take over-the-counter and prescription medicines only as told by your health care provider.  Practice taking relaxed and deep breaths when you are sitting. A good time to practice is when  you are watching TV. Take a few deep breaths during each commercial break.  Make sure to lie on your unaffected side when you are lying down. For example, if you have atelectasis in your left lung, lie on your right side. This will help mucus drain from your airway.  Cough several times a day as told  by your health care provider.  Perform chest physiotherapy or postural drainage techniques as told by your health care provider. If necessary, have someone help you.  If you were given a device to help with breathing, use it as told by your health care provider.  Stay as active as possible. Get help right away if:  Your breathing problems get worse.  You have severe chest pain.  You develop severe coughing.  You cough up blood.  You have a fever.  You have persistent symptoms for more than 2-3 days.  Your symptoms suddenly get worse. This information is not intended to replace advice given to you by your health care provider. Make sure you discuss any questions you have with your health care provider. Document Released: 03/25/2005 Document Revised: 10/13/2015 Document Reviewed: 08/28/2015 Elsevier Interactive Patient Education  2019 [employer].

## 2018-04-16 NOTE — Telephone Encounter (Signed)
This may have been sent on accident because we supply those here to through [facility]. [facility] to let [facility] know that they could void the RX. SPoke to the pharmacist and nothing further is needed.

## 2018-04-16 NOTE — Progress Notes (Signed)
Synopsis: Referred in 04/16/18 for RML collapse  Subjective:   PATIENT ID: Chase Wright. GENDER: male DOB: 1946/03/27, MRN: [id number]   HPI  Chief Complaint  Patient presents with  . Consult    cough x 5 yrs (says it sounds congested) - worse at home and when he's talking - house has mold and he has 5 dogs   Chase Wright. Is a 73 year old male never smoker with hypertension, GERD, renal insufficiency and BPH who presents as new consult for RML lung collapse.  He has had a chronic cough x 5 years. However he reports gradually worsening productive cough with sputum. In the last three months, he has been seen by his PCP, Dr. Redmond School, treated him with azithromycin followed by another course with levofloxacin however this did not improve his symptoms.  Cough worsens with talking. Denies fevers, chills, night sweats or unintentional weight loss. On further work-up with his PCP, CXR and CT Chest demonstrated right middle lobe collapse.  He was referred to pulmonary for further evaluation.  Denies shortness of breath or wheezing  Social History: Works at [employer] exposures: Mold in home, indoor five dogs at home.   I have personally reviewed patient's past medical/family/social history, allergies, current medications.  Past Medical History:  Diagnosis Date  . ASHD (arteriosclerotic heart disease)   . Chronic kidney disease    RENAL STONES  . Diverticulitis   . GERD (gastroesophageal reflux disease)   . Hx of colonic polyps   . Hypertension   . Obesity   . PUD (peptic ulcer disease)   . Renal insufficiency      Family History  Problem Relation Age of Onset  . Diabetes Mother   . Prostate cancer Father 74  . Colon cancer Neg Hx      Social History   Occupational History  . Not on file  Tobacco Use  . Smoking status: Never Smoker  . Smokeless tobacco: Never Used  Substance and Sexual Activity  . Alcohol use: Yes    Comment: rare beer  .  Drug use: No  . Sexual activity: Not Currently    No Known Allergies   Outpatient Medications Prior to Visit  Medication Sig Dispense Refill  . alfuzosin (UROXATRAL) 10 MG 24 hr tablet Take 10 mg by mouth daily.      Marland Kitchen allopurinol (ZYLOPRIM) 300 MG tablet TAKE 1 TABLET BY MOUTH EVERY DAY 90 tablet 1  . amLODipine (NORVASC) 5 MG tablet TAKE 1 TABLET BY MOUTH EVERY DAY 90 tablet 0  . famotidine (PEPCID) 10 MG tablet Take 10 mg by mouth 2 (two) times daily.    . hydrochlorothiazide (MICROZIDE) 12.5 MG capsule TAKE 1 CAPSULE BY MOUTH EVERY DAY 90 capsule 0  . metoprolol succinate (TOPROL-XL) 50 MG 24 hr tablet Take 1 tablet (50 mg total) by mouth daily. Take with or immediately following a meal. 90 tablet 3  . ramipril (ALTACE) 10 MG capsule TAKE 1 CAPSULE BY MOUTH EVERY DAY 90 capsule 0  . amoxicillin-clavulanate (AUGMENTIN) 875-125 MG tablet Take 1 tablet by mouth 2 (two) times daily. 20 tablet 0  . azithromycin (ZITHROMAX) 500 MG tablet Take 1 tablet (500 mg total) by mouth daily. 3 tablet 0  . benzonatate (TESSALON PERLES) 100 MG capsule Take 2 capsules (200 mg total) by mouth 3 (three) times daily as needed for cough. 20 capsule 0  . diclofenac sodium (VOLTAREN) 1 % GEL Apply 2 g topically 3 (  three) times daily as needed. 1 Tube 1  . levofloxacin (LEVAQUIN) 500 MG tablet Take 1 tablet (500 mg total) by mouth daily. 10 tablet 0  . lidocaine (LIDODERM) 5 % One 12 hours on then 12 hours off then repeat with new patch. 10 patch 0  . metoprolol succinate (TOPROL-XL) 50 MG 24 hr tablet TAKE 1 TABLET BY MOUTH EVERY DAY WITH OR IMMEDIATELY FOLLOWING A MEAL 90 tablet 0   No facility-administered medications prior to visit.     Review of Systems  Constitutional: Negative for chills, diaphoresis, fever, malaise/fatigue and weight loss.  HENT: Negative for congestion and sore throat.   Respiratory: Positive for cough and sputum production. Negative for hemoptysis, shortness of breath and wheezing.    Cardiovascular: Negative for chest pain, orthopnea, leg swelling and PND.  Gastrointestinal: Negative for abdominal pain, heartburn and nausea.  Genitourinary: Negative for frequency.  Musculoskeletal: Negative for myalgias.  Skin: Negative for rash.  Neurological: Negative for dizziness, weakness and headaches.  Endo/Heme/Allergies: Does not bruise/bleed easily.     Objective:   Vitals:   04/16/18 0917  BP: 130/64  Pulse: 64  SpO2: 97%  Weight: 242 lb 6.4 oz (110 kg)  Height: _0  (1.676 m)   SpO2: 97 % O2 Device: None (Room air)  Physical Exam General: Well-appearing, no acute distress HENT: [place], AT, OP clear, MMM Eyes: EOMI, no scleral icterus Respiratory: Diminished right lower lobe breath sounds. No crackles, wheezing or rales Cardiovascular: RRR, -M/R/G, no JVD GI: BS+, soft, nontender Extremities:-Edema,-tenderness Neuro: AAO x4, CNII-XII grossly intact Skin: Intact, no rashes or bruising Psych: Normal mood, normal affect  Chest imaging: CXR 02-25-18 RML atelectasis with diaphragm elevation CT Chest 03-04-18 RML collapse  PFT: None  Imaging, labs and test noted above have been reviewed independently by me.    Assessment & Plan:   Discussion: 73 year old male with chronic cough that has worsened in the last three months. Chest imaging with RML collapse.  RML Collapse  Suspect this is secondary to atelectasis related to mucous plugging however cannot rule out endobronchial obstruction. Discussed risks and benefits of diagnostic bronchoscopy +/- biopsy.  --Will schedule patient for flexible bronchoscopy  --Flutter valve. Bedside nursing for instructions  No orders of the defined types were placed in this encounter.  Meds ordered this encounter  Medications  . Respiratory Therapy Supplies (FLUTTER) DEVI    Sig: Use device 2-3 times a day to break up congestion    Dispense:  1 each    Refill:  0    Return in 4 weeks (on 05/14/2018).  Chi Rodman Pickle, MD [facility] 04/19/2018 12:12 PM  Personal pager: [phone] If unanswered, please page CCM On-call: [phone]

## 2018-04-19 DIAGNOSIS — R05 Cough: Secondary | ICD-10-CM | POA: Insufficient documentation

## 2018-04-19 DIAGNOSIS — J9811 Atelectasis: Secondary | ICD-10-CM | POA: Insufficient documentation

## 2018-04-19 DIAGNOSIS — R053 Chronic cough: Secondary | ICD-10-CM | POA: Insufficient documentation

## 2018-04-20 ENCOUNTER — Telehealth: Payer: Self-pay

## 2018-04-20 ENCOUNTER — Other Ambulatory Visit: Payer: Self-pay | Admitting: Pulmonary Disease

## 2018-04-20 ENCOUNTER — Other Ambulatory Visit: Payer: Self-pay

## 2018-04-20 MED ORDER — FLUTTER DEVI: 0 refills | Status: DC

## 2018-04-20 NOTE — Telephone Encounter (Signed)
Left message for patient to call us to set up appt Feb 6 or 7 per Dr. George Hugh request

## 2018-05-01 ENCOUNTER — Ambulatory Visit (HOSPITAL_COMMUNITY)
Admission: RE | Admit: 2018-05-01 | Discharge: 2018-05-01 | Disposition: A | Discharge status: Home / Self Care | Payer: Medicare Other | Source: Ambulatory Visit | Attending: Pulmonary Disease | Admitting: Pulmonary Disease

## 2018-05-01 ENCOUNTER — Ambulatory Visit (HOSPITAL_COMMUNITY)
Admission: RE | Admit: 2018-05-01 | Discharge: 2018-05-01 | Disposition: A | Discharge status: Home / Self Care | Payer: Medicare Other | Attending: Pulmonary Disease | Admitting: Pulmonary Disease

## 2018-05-01 ENCOUNTER — Encounter (HOSPITAL_COMMUNITY)
Admission: RE | Disposition: A | Discharge status: Home / Self Care | Payer: Self-pay | Source: Home / Self Care | Attending: Pulmonary Disease

## 2018-05-01 ENCOUNTER — Encounter (HOSPITAL_COMMUNITY): Payer: Self-pay | Admitting: Respiratory Therapy

## 2018-05-01 DIAGNOSIS — N189 Chronic kidney disease, unspecified: Secondary | ICD-10-CM | POA: Diagnosis not present

## 2018-05-01 DIAGNOSIS — J9811 Atelectasis: Secondary | ICD-10-CM | POA: Insufficient documentation

## 2018-05-01 DIAGNOSIS — Z7982 Long term (current) use of aspirin: Secondary | ICD-10-CM | POA: Diagnosis not present

## 2018-05-01 DIAGNOSIS — K219 Gastro-esophageal reflux disease without esophagitis: Secondary | ICD-10-CM | POA: Diagnosis not present

## 2018-05-01 DIAGNOSIS — R911 Solitary pulmonary nodule: Secondary | ICD-10-CM | POA: Insufficient documentation

## 2018-05-01 DIAGNOSIS — N400 Enlarged prostate without lower urinary tract symptoms: Secondary | ICD-10-CM | POA: Diagnosis not present

## 2018-05-01 DIAGNOSIS — E669 Obesity, unspecified: Secondary | ICD-10-CM | POA: Insufficient documentation

## 2018-05-01 DIAGNOSIS — J189 Pneumonia, unspecified organism: Secondary | ICD-10-CM | POA: Diagnosis not present

## 2018-05-01 DIAGNOSIS — I129 Hypertensive chronic kidney disease with stage 1 through stage 4 chronic kidney disease, or unspecified chronic kidney disease: Secondary | ICD-10-CM | POA: Insufficient documentation

## 2018-05-01 DIAGNOSIS — Z79899 Other long term (current) drug therapy: Secondary | ICD-10-CM | POA: Insufficient documentation

## 2018-05-01 DIAGNOSIS — I2510 Atherosclerotic heart disease of native coronary artery without angina pectoris: Secondary | ICD-10-CM | POA: Insufficient documentation

## 2018-05-01 DIAGNOSIS — R05 Cough: Secondary | ICD-10-CM | POA: Diagnosis not present

## 2018-05-01 HISTORY — PX: VIDEO BRONCHOSCOPY: SHX5072

## 2018-05-01 SURGERY — VIDEO BRONCHOSCOPY WITHOUT FLUORO
Anesthesia: Moderate Sedation | Laterality: Bilateral

## 2018-05-01 MED ORDER — SODIUM CHLORIDE 0.9 % IV SOLN
Freq: Once | INTRAVENOUS | Status: AC
  Administered 2018-05-01: 08:00:00 via INTRAVENOUS

## 2018-05-01 MED ORDER — LIDOCAINE HCL URETHRAL/MUCOSAL 2 % EX GEL
CUTANEOUS | Status: DC | PRN
  Administered 2018-05-01: 1

## 2018-05-01 MED ORDER — FENTANYL CITRATE (PF) 100 MCG/2ML IJ SOLN
INTRAMUSCULAR | Status: AC
  Filled 2018-05-01: qty 4

## 2018-05-01 MED ORDER — MIDAZOLAM HCL (PF) 5 MG/ML IJ SOLN
INTRAMUSCULAR | Status: AC
  Filled 2018-05-01: qty 2

## 2018-05-01 MED ORDER — PHENYLEPHRINE HCL 0.25 % NA SOLN
NASAL | Status: DC | PRN
  Administered 2018-05-01: 2 via NASAL

## 2018-05-01 MED ORDER — FENTANYL CITRATE (PF) 100 MCG/2ML IJ SOLN
25.0000 ug | INTRAMUSCULAR | Status: DC | PRN
  Administered 2018-05-01: 25 ug via INTRAVENOUS
  Administered 2018-05-01: 75 ug via INTRAVENOUS

## 2018-05-01 MED ORDER — MIDAZOLAM HCL (PF) 5 MG/ML IJ SOLN
INTRAMUSCULAR | Status: DC | PRN
  Administered 2018-05-01: 1 mg via INTRAVENOUS

## 2018-05-01 MED ORDER — EPINEPHRINE PF 1 MG/10ML IJ SOSY
PREFILLED_SYRINGE | INTRAMUSCULAR | Status: DC | PRN
  Administered 2018-05-01: 5 mL via ENDOTRACHEOPULMONARY

## 2018-05-01 MED ORDER — LIDOCAINE HCL 2 % EX GEL
1.0000 "application " | Freq: Once | CUTANEOUS | Status: DC
  Filled 2018-05-01: qty 4250

## 2018-05-01 MED ORDER — LIDOCAINE HCL 1 % IJ SOLN
INTRAMUSCULAR | Status: DC | PRN
  Administered 2018-05-01: 6 mL via RESPIRATORY_TRACT

## 2018-05-01 MED ORDER — BUTAMBEN-TETRACAINE-BENZOCAINE 2-2-14 % EX AERO: 1.0000 | INHALATION_SPRAY | Freq: Once | CUTANEOUS | Status: DC

## 2018-05-01 MED ORDER — PHENYLEPHRINE HCL 0.25 % NA SOLN: 1.0000 | Freq: Four times a day (QID) | NASAL | Status: DC | PRN

## 2018-05-01 NOTE — Discharge Instructions (Signed)
Flexible Bronchoscopy, Care After This sheet gives you information about how to care for yourself after your test. Your doctor may also give you more specific instructions. If you have problems or questions, contact your doctor. Follow these instructions at home: Eating and drinking  Do not eat or drink anything (not even water) for 2 hours after your test, or until your numbing medicine (local anesthetic) wears off.  When your numbness is gone and your cough and gag reflexes have come back, you may: ? Eat only soft foods. ? Slowly drink liquids.  The day after the test, go back to your normal diet. Driving  Do not drive for 24 hours if you were given a medicine to help you relax (sedative).  Do not drive or use heavy machinery while taking prescription pain medicine. General instructions   Take over-the-counter and prescription medicines only as told by your doctor.  Return to your normal activities as told. Ask what activities are safe for you.  Do not use any products that have nicotine or tobacco in them. This includes cigarettes and e-cigarettes. If you need help quitting, ask your doctor.  Keep all follow-up visits as told by your doctor. This is important. It is very important if you had a tissue sample (biopsy) taken. Get help right away if:  You have shortness of breath that gets worse.  You get light-headed.  You feel like you are going to pass out (faint).  You have chest pain.  You cough up: ? More than a little blood. ? More blood than before. Summary  Do not eat or drink anything (not even water) for 2 hours after your test, or until your numbing medicine wears off.  Do not use cigarettes. Do not use e-cigarettes.  Get help right away if you have chest pain.   Donot eat or drink anything until 10:20 am on 05/01/2018. This information is not intended to replace advice given to you by your health care provider. Make sure you discuss any questions you have  with your health care provider. Document Released: 01/20/2009 Document Revised: 04/12/2016 Document Reviewed: 04/12/2016 Elsevier Interactive Patient Education  2019 [employer].

## 2018-05-01 NOTE — H&P (Signed)
NAME:  Chase Wright, [name] MRN:  [id number], DOB:  [birth year], LOS: 0 ADMISSION DATE:  05/01/2018, CONSULTATION DATE:  05/01/18   History of present illness   Mr. Chase Wright is a 73 year old male with nonsmoker who is admitted for flexible bronchoscopy for indication of right middle lobe collapse.  ASA 325 mg taken within 24 hours.  Past Medical History  HTN GERD Renal insufficiency BPH   Objective   Blood pressure 138/79, pulse 60, temperature 98.5 F (36.9 C), temperature source Oral, resp. rate 20, height _0  (1.676 m), SpO2 99 %.       No intake or output data in the 24 hours ending 05/01/18 0758 There were no vitals filed for this visit.  Physical Exam: General: Well-appearing, no acute distress HENT: [place], AT, OP clear, MMM Eyes: EOMI, no scleral icterus Respiratory: Clear to auscultation bilaterally.  No crackles, wheezing or rales Cardiovascular: RRR, -M/R/G, no JVD GI: BS+, soft, nontender Extremities:-Edema,-tenderness Neuro: AAO x4, CNII-XII grossly intact Skin: Intact, no rashes or bruising Psych: Normal mood, normal affect   Assessment & Plan:  Right middle lobe collapse  Imaging reviewed. Will proceed with bronchoscopy with BAL and possible biopsy Patient consented to procedure.  Labs/Imaging  CT Chest w Contrast 03/04/18 Right middle lobe collapse which has increased in the interval from the prior chest x-ray from 2017. Bronchial occlusion is noted with a slight area of increased attenuation identified centrally which may represent an aspirated foreign body or possible mass. Underlying mass is felt to be less likely. Bronchoscopic evaluation may be helpful.  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 Recommend annual imaging followup by CTA or MRA. This recommendation follows 2010 ACCF/AHA/AATS/ACR/ASA/SCA/SCAI/SIR/STS/SVM Guidelines for the Diagnosis and Management of Patients with Thoracic Aortic  Disease. Circulation. 2010; 121: E280-K349  Aortic Atherosclerosis (ICD10-I70.0).  Review of Systems:   Review of Systems  Constitutional: Negative for chills, diaphoresis, fever, malaise/fatigue and weight loss.  HENT: Negative for congestion and sore throat.   Respiratory: Positive for cough. Negative for hemoptysis, sputum production, shortness of breath and wheezing.   Cardiovascular: Negative for chest pain, orthopnea, leg swelling and PND.  Gastrointestinal: Negative for abdominal pain, heartburn and nausea.  Genitourinary: Negative for frequency.  Musculoskeletal: Negative for myalgias.  Skin: Negative for rash.  Neurological: Negative for dizziness, weakness and headaches.  Endo/Heme/Allergies: Does not bruise/bleed easily.     Past Medical History  He,  has a past medical history of ASHD (arteriosclerotic heart disease), Chronic kidney disease, Diverticulitis, GERD (gastroesophageal reflux disease), colonic polyps, Hypertension, Obesity, PUD (peptic ulcer disease), and Renal insufficiency.   Surgical History    Past Surgical History:  Procedure Laterality Date  . 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 CARDIAC CATHETERIZATION  08/2005  . NASAL ENDOSCOPY  2008     Social History   reports that he has never smoked. He has never used smokeless tobacco. He reports current alcohol use. He reports that he does not use drugs.   Family History   His family history includes Diabetes in his mother; Prostate cancer (age of onset: 28) in his father. There is no history of Colon cancer.   Allergies No Known Allergies   Home Medications  Prior to Admission medications   Medication Sig Start Date End Date Taking? Authorizing Provider  alfuzosin (UROXATRAL) 10 MG 24 hr tablet Take 10 mg by mouth daily.     Yes [provider]  allopurinol (ZYLOPRIM) 300 MG tablet  TAKE 1 TABLET BY MOUTH EVERY DAY 11/20/17  Yes Denita Lung, MD  amLODipine (NORVASC) 5 MG tablet TAKE 1 TABLET BY  MOUTH EVERY DAY 12/26/17  Yes Denita Lung, MD  aspirin 325 MG tablet Take 325 mg by mouth daily.   Yes [provider]  famotidine (PEPCID) 20 MG tablet Take 20 mg by mouth daily.    Yes [provider]  hydrochlorothiazide (MICROZIDE) 12.5 MG capsule TAKE 1 CAPSULE BY MOUTH EVERY DAY 12/26/17  Yes Denita Lung, MD  metoprolol succinate (TOPROL-XL) 50 MG 24 hr tablet Take 1 tablet (50 mg total) by mouth daily. Take with or immediately following a meal. 02/25/12  Yes Denita Lung, MD  ramipril (ALTACE) 10 MG capsule TAKE 1 CAPSULE BY MOUTH EVERY DAY 12/26/17  Yes Denita Lung, MD  Respiratory Therapy Supplies (FLUTTER) DEVI Use device 2-3 times a day to break up congestion 04/20/18  Yes Margaretha Seeds, MD

## 2018-05-01 NOTE — Progress Notes (Signed)
Video bronchoscopy performed.  Intervention bronchial biopsy.  No complications noted.  Will continue to monitor. 

## 2018-05-02 NOTE — Op Note (Signed)
[facility] Cardiopulmonary Patient Name: Chase Wright Procedure Date: 05/01/2018 MRN: [id number] Attending MD: Mechele Collin , MD Date of Birth: 23-Jun-1945 CSN: [id number] Age: 73 Admit Type: Outpatient Ethnicity: Not Hispanic or Latino Procedure:            Bronchoscopy Indications:          Atelectasis of the right middle lobe, Atelectasis of                        the right lower lobe, Chronic cough with abnormal CT Providers:            Mechele Collin, MD, Lysbeth Penner RRT, RCP, Terrall Laity Referring MD:          Medicines:            Midazolam 1 mg IV, Fentanyl 100 mcg IV Complications:        Moderate bleeding, requiring Epinephrine Estimated Blood Loss: Estimated blood loss was minimal. Procedure:      Pre-Anesthesia Assessment:      - A History and Physical has been performed. Patient meds and allergies       have been reviewed. The risks and benefits of the procedure and the       sedation options and risks were discussed with the patient. All       questions were answered and informed consent was obtained. Patient       identification and proposed procedure were verified prior to the       procedure. Mental Status Examination: normal. Airway Examination: normal       oropharyngeal airway. Respiratory Examination: clear to auscultation. CV       Examination: RRR, no murmurs, no S3 or S4. ASA Grade Assessment: I - A       normal healthy patient. After reviewing the risks and benefits, the       patient was deemed in satisfactory condition to undergo the procedure.       The anesthesia plan was to use moderate sedation / analgesia (conscious       sedation). Immediately prior to administration of medications, the       patient was re-assessed for adequacy to receive sedatives. The heart       rate, respiratory rate, oxygen saturations, blood pressure, adequacy of       pulmonary ventilation, and response to care were monitored  throughout       the procedure. The physical status of the patient was re-assessed after       the procedure.      After obtaining informed consent, the bronchoscope was passed under       direct vision. Throughout the procedure, the patient's blood pressure,       pulse, and oxygen saturations were monitored continuously. the BF-H190       ([id number]) Olympus Bronchoscope was introduced through the right nostril       and advanced to the tracheobronchial tree. After obtaining informed       consent, the bronchoscope was passed under direct vision. Throughout the       procedure, the patient's blood pressure, pulse, and oxygen saturations       were monitored continuously.The procedure was accomplished without       difficulty. The patient tolerated the procedure. Findings:  The nasopharynx/oropharynx appears normal. The larynx appears normal.       The vocal cords appear normal. The subglottic space is normal. The       trachea is of normal caliber. The carina is sharp. The tracheobronchial       tree was examined to at least the first subsegmental level. Bronchial       mucosa and anatomy are normal; Minimal Secretions.      Right Lung Abnormalities: One medium sized partially obstructing nodule       was found in the right middle lobe. Endobronchial biopsies were       performed in the right middle lobe using forceps and sent for routine       cytology. Two samples were obtained. Impression:      - Atelectasis of the right middle lobe      - Atelectasis of the right lower lobe      - Chronic cough with abnormal CT      - The airway examination was normal.      - One nodule was found in the right middle lobe.      - An endobronchial biopsy was performed. Moderate Sedation:      The administration of moderate sedation was initiated at 08:04 AM.      Moderate (conscious) sedation was Respiratory Therapist. The following       parameters were monitored: oxygen saturation, heart rate,  blood       pressure, and response to care. Total physician intraservice time was 20       minutes. Recommendation:      - Await cytology results. Procedure Code(s):      --- Professional ---      [phone] [id number], Bronchoscopy, rigid or flexible, including fluoroscopic guidance,       when performed; with bronchial or endobronchial biopsy(s), single or       multiple sites      99152, Moderate sedation services provided by the same physician or       other qualified health care professional performing the diagnostic or       therapeutic service that the sedation supports, requiring the presence       of an independent trained observer to assist in the monitoring of the       patient's level of consciousness and physiological status; initial 15       minutes of intraservice time, patient age 56 years or older Diagnosis Code(s):      --- Professional ---      R05, Cough      J98.11, Atelectasis CPT copyright 2018 American Medical Association. All rights reserved. The codes documented in this report are preliminary and upon coder review may  be revised to meet current compliance requirements. Mechele CollinJane , MD 05/02/2018 1:50:32 PM This report has been signed electronically. Number of Addenda: 0 Scope In: 8:08:36 AM Scope Out: 8:22:01 AM

## 2018-05-04 ENCOUNTER — Telehealth: Payer: Self-pay | Admitting: Pulmonary Disease

## 2018-05-04 ENCOUNTER — Encounter (HOSPITAL_COMMUNITY): Payer: Self-pay | Admitting: Pulmonary Disease

## 2018-05-04 NOTE — Telephone Encounter (Signed)
Unsure what to tell you based off of this.  Sounds like an appropriate response from bronchoscopy.  What is the patient asking?  What is the patient concerned with?  If patient is acutely getting short of breath and he has concerns regarding this then he needs to be scheduled for an office visit  Elisha Headland FNP

## 2018-05-04 NOTE — Telephone Encounter (Signed)
Primary Pulmonologist: Dr. Loanne Drilling Last office visit and with whom: Dr Loanne Drilling, 04/16/18 What do we see them for (pulmonary problems): Atelectasis  Last OV assessment/plan:  Instructions  Return in 4 weeks (on 05/14/2018).  RML Collapse  Suspect this is secondary to atelectasis related to mucous plugging however cannot rule out endobronchial obstruction. Discussed risks and benefits of diagnostic bronchoscopy +/- biopsy.  --Will schedule patient for flexible bronchoscopy  --Flutter valve. Bedside nursing for instructions       Was appointment offered to patient (explain)?  Yes, Patient declined.  He doesn't feel that bad at this time.  Just wanted to let us know, and make sure he is ok.   Reason for call:  Called and spoke with Patient.  He stated that he had a bronchoscopy Friday, 05/01/18. He stated that he went home, took nap, and when he woke up, from sleeping on his stomach, he felt very SHOB,and couldn't catch his breath. He had a coughing spell directly afterwards, and coughed a bunch of phlegm.  He stated that he did not notice any blood, or discoloration in phlegm. He stated that he felt great Saturday, felt bad Sunday, and feels good today. He is sleeping in his recliner, because he is afraid if he sleeps lying down, he may get [facility] again.    Will route to Wyn Quaker, NP to advise

## 2018-05-04 NOTE — Telephone Encounter (Signed)
LMTCB x1 for pt.  

## 2018-05-05 NOTE — Telephone Encounter (Signed)
Spoke with patient-feels better today but has increased phlegm that he is able to breathe better. Pt is aware to contact the office if he has other concerns about bronch that does not warrant an ED visit. Nothing more needed at this time.

## 2018-05-07 ENCOUNTER — Telehealth: Payer: Self-pay | Admitting: Pulmonary Disease

## 2018-05-07 NOTE — Telephone Encounter (Signed)
Called and spoke to pt, who stated he is  Returning Dr. George HughEllison's call regarding results.    Dr. Everardo AllEllison please advise. Thanks.

## 2018-05-07 NOTE — Telephone Encounter (Signed)
Called patient. No answer. LCBM.

## 2018-05-08 NOTE — Telephone Encounter (Signed)
Message sent to Karlton Lemon, RN Page sent to Dr. Everardo All to call back When she does we need to ask if she can check results of the bronchoscopy for this patient, dated 05/02/18.

## 2018-05-08 NOTE — Telephone Encounter (Signed)
Pt is calling back about his results [phone]

## 2018-05-08 NOTE — Telephone Encounter (Signed)
Contacted patient via telephone. His biopsy results are negative for malignancy. Will plan to see patient in clinic next week for CXR. Pending results, will obtain CT Chest for follow-up. Patient understood and agreed to the plan.  Mechele Collin, M.D. [facility] Pulmonary/Critical Care Medicine Pager: [phone] After hours pager: [phone]

## 2018-05-08 NOTE — Telephone Encounter (Signed)
Spoke with patient. Made him aware Dr. Everardo All was paged (by Angelique Blonder), but we have not been able to get a response yet as she is working in the hospital today. Advised the patient that as soon as we are able, he will be contacted with the results.

## 2018-05-14 ENCOUNTER — Encounter: Payer: Self-pay | Admitting: Pulmonary Disease

## 2018-05-14 ENCOUNTER — Ambulatory Visit (INDEPENDENT_AMBULATORY_CARE_PROVIDER_SITE_OTHER): Payer: Medicare Other | Admitting: Pulmonary Disease

## 2018-05-14 ENCOUNTER — Ambulatory Visit (INDEPENDENT_AMBULATORY_CARE_PROVIDER_SITE_OTHER)
Admission: RE | Admit: 2018-05-14 | Discharge: 2018-05-14 | Disposition: A | Discharge status: Home / Self Care | Payer: Medicare Other | Source: Ambulatory Visit | Attending: Pulmonary Disease | Admitting: Pulmonary Disease

## 2018-05-14 VITALS — BP 140/82 | HR 58 | Ht 66.0 in | Wt 246.4 lb

## 2018-05-14 DIAGNOSIS — J9811 Atelectasis: Secondary | ICD-10-CM

## 2018-05-14 DIAGNOSIS — R918 Other nonspecific abnormal finding of lung field: Secondary | ICD-10-CM | POA: Diagnosis not present

## 2018-05-14 NOTE — Progress Notes (Signed)
Synopsis: Referred in 04/16/18 for RML collapse  Subjective:   PATIENT ID: Chase Wright. GENDER: male DOB: 06-24-1945, MRN: [id number]   HPI  Chief Complaint  Patient presents with  . Follow-up    pt states breathing is better, no increased SOB or chest tightness/discomfort, f/u after bronch   Chase Wright. Is a 73 year old male never smoker with hypertension, GERD, renal insufficiency and BPH who presents for follow-up.  He initially presented for worsening productive cough x 3 months that was refractory to antibiotic treatment. He was referred to Pulmonary for RML collapse seen on chest imaging. Underwent bronchoscopy on 05/02/18 which revealed endobronchial lesion in the RML. Lesion was sampled and removed with forceps biopsy. Pathology returned negative for malignancy. Since bronchoscopy, he reports improved symptoms, seldomly coughing. Minimal sputum production and usually clear. Denies shortness of breath or wheezing. Denies fevers, chills, weight loss or night sweats.  Social History: Works at [employer] exposures: Mold in home, indoor five dogs at home.   I have personally reviewed patient's past medical/family/social history/allergies/current medications.  Past Medical History:  Diagnosis Date  . ASHD (arteriosclerotic heart disease)   . Chronic kidney disease    RENAL STONES  . Diverticulitis   . GERD (gastroesophageal reflux disease)   . Hx of colonic polyps   . Hypertension   . Obesity   . PUD (peptic ulcer disease)   . Renal insufficiency      Family History  Problem Relation Age of Onset  . Diabetes Mother   . Prostate cancer Father 57  . Colon cancer Neg Hx      Social History   Occupational History  . Not on file  Tobacco Use  . Smoking status: Never Smoker  . Smokeless tobacco: Never Used  Substance and Sexual Activity  . Alcohol use: Yes    Comment: rare beer  . Drug use: No  . Sexual activity: Not Currently     No Known Allergies   Outpatient Medications Prior to Visit  Medication Sig Dispense Refill  . alfuzosin (UROXATRAL) 10 MG 24 hr tablet Take 10 mg by mouth daily.      Marland Kitchen allopurinol (ZYLOPRIM) 300 MG tablet TAKE 1 TABLET BY MOUTH EVERY DAY 90 tablet 1  . amLODipine (NORVASC) 5 MG tablet TAKE 1 TABLET BY MOUTH EVERY DAY 90 tablet 0  . aspirin 325 MG tablet Take 325 mg by mouth daily.    . famotidine (PEPCID) 20 MG tablet Take 20 mg by mouth daily.     . hydrochlorothiazide (MICROZIDE) 12.5 MG capsule TAKE 1 CAPSULE BY MOUTH EVERY DAY 90 capsule 0  . metoprolol succinate (TOPROL-XL) 50 MG 24 hr tablet Take 1 tablet (50 mg total) by mouth daily. Take with or immediately following a meal. 90 tablet 3  . ramipril (ALTACE) 10 MG capsule TAKE 1 CAPSULE BY MOUTH EVERY DAY 90 capsule 0  . Respiratory Therapy Supplies (FLUTTER) DEVI Use device 2-3 times a day to break up congestion 1 each 0   No facility-administered medications prior to visit.     Review of Systems  Constitutional: Negative for chills, fever, malaise/fatigue and weight loss.  HENT: Negative for congestion.   Respiratory: Positive for cough. Negative for sputum production, shortness of breath and wheezing.   Cardiovascular: Negative for chest pain, leg swelling and PND.  Endo/Heme/Allergies: Positive for environmental allergies.    Objective:   Vitals:   05/14/18 1437  BP: 140/82  Pulse: Marland Kitchen)  58  SpO2: 98%  Weight: 246 lb 6.4 oz (111.8 kg)  Height: 5\' 6"  (1.676 m)   SpO2: 98 % O2 Device: None (Room air)  Physical Exam: General: Well-appearing, no acute distress HENT: [place], AT, OP clear, MMM Eyes: EOMI, no scleral icterus Respiratory: Diminished breath sounds bilaterally. No crackles, wheezing or rales Cardiovascular: RRR, -M/R/G, no JVD GI: BS+, soft, nontender Extremities:-Edema,-tenderness Neuro: AAO x4, CNII-XII grossly intact Skin: Intact, no rashes or bruising Psych: Normal mood, normal affect  Chest  imaging: CXR 02-25-18 RML atelectasis with diaphragm elevation CT Chest 03-04-18 RML collapse CXR 05-14-18 Improved atelectasis of RML  PFT: None  Imaging, labs and test noted above have been reviewed independently by me.    Assessment & Plan:   Discussion: 73 year old male who initially presented for worsening productive cough x 3 months and found with RML on chest imaging. Underwent bronchoscopy on 05/02/18 which revealed endobronchial lesion in the RML. Pathology negative for malignancy.   RML Collapse - improved Secondary endobronchial obstruction with improvement in atelectasis s/p bronchoscopy. No evidence of malignancy on biopsy. May represent benign lesion.  --CT Chest without contrast --Pending results, will schedule follow-up as needed  Orders Placed This Encounter  Procedures  . DG Chest 2 View    Standing Status:   Future    Number of Occurrences:   1    Standing Expiration Date:   07/13/2019    Order Specific Question:   Reason for Exam (SYMPTOM  OR DIAGNOSIS REQUIRED)    Answer:   R middle lobe atelectasis    Order Specific Question:   Preferred imaging location?    Answer:   Internal    Order Specific Question:   Radiology Contrast Protocol - do NOT remove file path    Answer:   \\[email or web address].pdf  . CT Chest Wo Contrast    Standing Status:   Future    Standing Expiration Date:   07/13/2019    Order Specific Question:   Preferred imaging location?    Answer:   [place] [facility] - [facility]    Order Specific Question:   Radiology Contrast Protocol - do NOT remove file path    Answer:   \\[email or web address]\[email or web address].pdf   No orders of the defined types were placed in this encounter.   Return if symptoms worsen or fail to improve.  Chi Mechele Collin, MD [facility] 05/17/2018 1:52 PM  Personal pager: [phone] If unanswered, please page CCM On-call: #[phone]

## 2018-05-15 DIAGNOSIS — R05 Cough: Secondary | ICD-10-CM | POA: Diagnosis not present

## 2018-05-17 DIAGNOSIS — R918 Other nonspecific abnormal finding of lung field: Secondary | ICD-10-CM | POA: Insufficient documentation

## 2018-05-31 ENCOUNTER — Telehealth: Payer: Self-pay | Admitting: Family Medicine

## 2018-05-31 ENCOUNTER — Other Ambulatory Visit: Payer: Self-pay | Admitting: Family Medicine

## 2018-05-31 DIAGNOSIS — I10 Essential (primary) hypertension: Secondary | ICD-10-CM

## 2018-05-31 DIAGNOSIS — I2510 Atherosclerotic heart disease of native coronary artery without angina pectoris: Secondary | ICD-10-CM

## 2018-05-31 NOTE — Telephone Encounter (Signed)
P.A. REPATHA 

## 2018-06-04 ENCOUNTER — Ambulatory Visit (INDEPENDENT_AMBULATORY_CARE_PROVIDER_SITE_OTHER)
Admission: RE | Admit: 2018-06-04 | Discharge: 2018-06-04 | Disposition: A | Discharge status: Home / Self Care | Payer: Medicare Other | Source: Ambulatory Visit | Attending: Pulmonary Disease | Admitting: Pulmonary Disease

## 2018-06-04 DIAGNOSIS — J9811 Atelectasis: Secondary | ICD-10-CM | POA: Diagnosis not present

## 2018-06-05 ENCOUNTER — Telehealth: Payer: Self-pay | Admitting: Pulmonary Disease

## 2018-06-05 NOTE — Telephone Encounter (Signed)
Notes recorded by Luciano Cutter, MD on 06/05/2018 at 12:12 PM EST Please contact patient regarding CT results: Right middle lobe lung collapse has resolved with only minimal scarring noted. No follow-up is needed. Please call for any questions or concerns. JE  Pt is aware of results and voiced his understanding.  Nothing further is needed.

## 2018-06-05 NOTE — Progress Notes (Signed)
Please contact patient regarding CT results: Right middle lobe lung collapse has resolved with only minimal scarring noted. No follow-up is needed. Please call for any questions or concerns. JE

## 2018-06-15 NOTE — Telephone Encounter (Signed)
Additional questions answered & submitted to [facility]

## 2018-06-24 ENCOUNTER — Encounter: Payer: Self-pay | Admitting: Family Medicine

## 2018-06-24 NOTE — Telephone Encounter (Signed)
P.A. denied stating because I couldn't answer that pt's cholesterol had decreased while on Repatha, when in fact pt had never started Repatha even though it was submitted and approved last year he had never actually started it due to the cost of the medication.  I called [facility] and explained but they stated an appeal had to be completed, so appeal letter was typed and faxed to t# [phone]

## 2018-06-27 NOTE — Telephone Encounter (Signed)
Recv'd letter from [facility] regarding appeal needing pt's LDL-C within last 120 days, last was 11/04/17, will fax this info

## 2018-07-08 MED ORDER — EVOLOCUMAB 140 MG/ML ~~LOC~~ SOAJ: 140.0000 mg | SUBCUTANEOUS | 3 refills | Status: DC

## 2018-07-08 NOTE — Telephone Encounter (Signed)
P.A. appeal approved, called pharmacy went thru for $47.  Rx put in epic. Pt left message for pt

## 2018-07-31 ENCOUNTER — Other Ambulatory Visit: Payer: Self-pay | Admitting: Family Medicine

## 2018-07-31 DIAGNOSIS — Z8739 Personal history of other diseases of the musculoskeletal system and connective tissue: Secondary | ICD-10-CM

## 2018-08-07 ENCOUNTER — Telehealth: Payer: Self-pay

## 2018-08-07 NOTE — Telephone Encounter (Signed)
Called pt to advise of appt needed for a med check.Has form in brown folder. KH

## 2018-08-28 ENCOUNTER — Other Ambulatory Visit: Payer: Self-pay | Admitting: Family Medicine

## 2018-08-28 DIAGNOSIS — I10 Essential (primary) hypertension: Secondary | ICD-10-CM

## 2018-08-28 DIAGNOSIS — I2510 Atherosclerotic heart disease of native coronary artery without angina pectoris: Secondary | ICD-10-CM

## 2018-08-28 NOTE — Telephone Encounter (Signed)
Is this ok to refill?  

## 2018-11-02 ENCOUNTER — Other Ambulatory Visit: Payer: Self-pay | Admitting: Family Medicine

## 2018-11-02 DIAGNOSIS — Z8739 Personal history of other diseases of the musculoskeletal system and connective tissue: Secondary | ICD-10-CM

## 2018-11-29 ENCOUNTER — Other Ambulatory Visit: Payer: Self-pay | Admitting: Family Medicine

## 2018-11-29 DIAGNOSIS — I10 Essential (primary) hypertension: Secondary | ICD-10-CM

## 2018-11-29 DIAGNOSIS — I2510 Atherosclerotic heart disease of native coronary artery without angina pectoris: Secondary | ICD-10-CM

## 2019-01-18 ENCOUNTER — Other Ambulatory Visit: Payer: Self-pay

## 2019-01-18 ENCOUNTER — Encounter: Payer: Self-pay | Admitting: Family Medicine

## 2019-01-18 ENCOUNTER — Ambulatory Visit (INDEPENDENT_AMBULATORY_CARE_PROVIDER_SITE_OTHER): Payer: Medicare Other | Admitting: Family Medicine

## 2019-01-18 VITALS — BP 122/74 | HR 68 | Temp 98.1°F | Ht 64.0 in | Wt 237.6 lb

## 2019-01-18 DIAGNOSIS — Z0000 Encounter for general adult medical examination without abnormal findings: Secondary | ICD-10-CM

## 2019-01-18 DIAGNOSIS — Z789 Other specified health status: Secondary | ICD-10-CM | POA: Diagnosis not present

## 2019-01-18 DIAGNOSIS — N289 Disorder of kidney and ureter, unspecified: Secondary | ICD-10-CM | POA: Diagnosis not present

## 2019-01-18 DIAGNOSIS — I10 Essential (primary) hypertension: Secondary | ICD-10-CM | POA: Diagnosis not present

## 2019-01-18 DIAGNOSIS — M1990 Unspecified osteoarthritis, unspecified site: Secondary | ICD-10-CM

## 2019-01-18 DIAGNOSIS — T466X5A Adverse effect of antihyperlipidemic and antiarteriosclerotic drugs, initial encounter: Secondary | ICD-10-CM

## 2019-01-18 DIAGNOSIS — I700 Atherosclerosis of aorta: Secondary | ICD-10-CM

## 2019-01-18 DIAGNOSIS — I2510 Atherosclerotic heart disease of native coronary artery without angina pectoris: Secondary | ICD-10-CM | POA: Diagnosis not present

## 2019-01-18 DIAGNOSIS — K219 Gastro-esophageal reflux disease without esophagitis: Secondary | ICD-10-CM | POA: Diagnosis not present

## 2019-01-18 DIAGNOSIS — L723 Sebaceous cyst: Secondary | ICD-10-CM | POA: Insufficient documentation

## 2019-01-18 DIAGNOSIS — E785 Hyperlipidemia, unspecified: Secondary | ICD-10-CM

## 2019-01-18 DIAGNOSIS — Z8739 Personal history of other diseases of the musculoskeletal system and connective tissue: Secondary | ICD-10-CM

## 2019-01-18 DIAGNOSIS — N401 Enlarged prostate with lower urinary tract symptoms: Secondary | ICD-10-CM

## 2019-01-18 DIAGNOSIS — Z86010 Personal history of colonic polyps: Secondary | ICD-10-CM

## 2019-01-18 DIAGNOSIS — M791 Myalgia, unspecified site: Secondary | ICD-10-CM

## 2019-01-18 MED ORDER — ALFUZOSIN HCL ER 10 MG PO TB24: 10.0000 mg | ORAL_TABLET | Freq: Every day | ORAL | 3 refills | Status: DC

## 2019-01-18 MED ORDER — HYDROCHLOROTHIAZIDE 12.5 MG PO CAPS: ORAL_CAPSULE | ORAL | 3 refills | Status: DC

## 2019-01-18 MED ORDER — ALLOPURINOL 300 MG PO TABS: 300.0000 mg | ORAL_TABLET | Freq: Every day | ORAL | 3 refills | Status: DC

## 2019-01-18 MED ORDER — AMLODIPINE BESYLATE 5 MG PO TABS: 5.0000 mg | ORAL_TABLET | Freq: Every day | ORAL | 3 refills | Status: DC

## 2019-01-18 MED ORDER — RAMIPRIL 10 MG PO CAPS: ORAL_CAPSULE | ORAL | 3 refills | Status: DC

## 2019-01-18 MED ORDER — METOPROLOL SUCCINATE ER 50 MG PO TB24: ORAL_TABLET | ORAL | 3 refills | Status: DC

## 2019-01-18 NOTE — Progress Notes (Signed)
Chase FortJames I Enoch Jr. is a 73 y.o. male who presents for annual wellness visit,CPE and follow-up on chronic medical conditions.  He has been told that his heart rate was irregular but he has not had any difficulty with irregular heart rate, chest pain, shortness of breath, diaphoresis, weakness.  He has not seen his cardiologist in several years. He continues on amlodipine, HCTZ, metoprolol and ramipril and having no difficulty with that.  He is statin intolerant.He is stopped taking his Repatha due to cost.   He is stopped taking his Repatha due to cost.  He does not exercise regularly.  He does note decreased hearing on the left but is not interested in pursuing a hearing aid.  He does have questions about prostate.  He also has a lesion present on the posterior neck area that I have looked at in the past.  Does have a history of colonic polyp but has had a Cologuard test which was negative.  Continues on his allopurinol without difficulty.  He is also taking Uroxatrol for BPH symptoms.  He is having nocturia x2 which is good for him.  He has a previous history of ulcer disease and presently is using Pepcid twice per day with good results.    Immunizations and Health Maintenance Immunization History  Administered Date(s) Administered  . DTaP 07/24/1992  . Influenza Split 01/06/2012, 01/17/2013, 02/22/2014  . Influenza Whole 01/19/1991, 02/11/2006, 12/31/2007  . Influenza, High Dose Seasonal PF 12/09/2013, 11/20/2016, 12/24/2017, 12/01/2018, 12/01/2018  . Influenza-Unspecified 01/06/2015, 12/31/2015, 11/20/2016  . Pneumococcal Conjugate-13 10/24/2016  . Pneumococcal Polysaccharide-23 10/06/2002, 02/25/2012  . Tdap 01/06/2007, 11/20/2016  . Zoster 10/06/2002  . Zoster Recombinat (Shingrix) 11/20/2016, 05/13/2017   There are no preventive care reminders to display for this patient.  Last colonoscopy: 10/17/16 Last PSA: 02/25/12 Dentist: over one year Ophtho:one and half years ago Exercise:  walking   Other doctors caring for patient include: Dr. Everardo AllEllison pulmonology, Wilmon PaliGuenther NP GI , Dr. [place] cardio  Advanced Directives: Has on file  Does Patient Have a Medical Advance Directive?: Yes Type of Advance Directive: Healthcare Power of Attorney, Living will Does patient want to make changes to medical advance directive?: No - Patient declined Copy of Healthcare Power of Attorney in Chart?: No - copy requested  Depression screen:  See questionnaire below.     [facility]2019 11/04/2017 10/24/2016 07/25/2016 02/23/2013  Decreased Interest 0 0 0 0 0  Down, Depressed, Hopeless 0 0 0 0 0  PHQ - 2 Score 0 0 0 0 0    Fall Screen: See Questionaire below.   Fall Risk  01/18/2019 10/24/2016 07/25/2016 05/26/2015 02/23/2013  Falls in the past year? 0 No No Yes No  Injury with Fall? - - - Yes -    ADL screen:  See questionnaire below.  Functional Status Survey: Is the patient deaf or have difficulty hearing?: Yes(left ear hearing loss per pt) Does the patient have difficulty seeing, even when wearing glasses/contacts?: No Does the patient have difficulty concentrating, remembering, or making decisions?: No Does the patient have difficulty walking or climbing stairs?: No Does the patient have difficulty dressing or bathing?: No Does the patient have difficulty doing errands alone such as visiting a doctor's office or shopping?: No   Review of Systems  Constitutional: -, -unexpected weight change, -anorexia, -fatigue Allergy: -sneezing, -itching, -congestion Dermatology: denies changing moles, rash, lumps ENT: -runny nose, -ear pain, -sore throat,  Cardiology:  -chest pain, -palpitations, -orthopnea, Respiratory: -cough, -shortness  of breath, -dyspnea on exertion, -wheezing,  Gastroenterology: -abdominal pain, -nausea, -vomiting, -diarrhea, -constipation, -dysphagia Hematology: -bleeding or bruising problems Musculoskeletal: -arthralgias, -myalgias, -joint  swelling, -back pain, - Ophthalmology: -vision changes,  Urology: -dysuria, -difficulty urinating,  -urinary frequency, -urgency, incontinence Neurology: -, -numbness, , -memory loss, -falls, -dizziness    PHYSICAL EXAM:  BP 122/74 (BP Location: Left Arm, Patient Position: Sitting)   Pulse 68   Temp 98.1 F (36.7 C)   Ht 5\' 4"  (1.626 m)   Wt 237 lb 9.6 oz (107.8 kg)   SpO2 96%   BMI 40.78 kg/m   General Appearance: Alert, cooperative, no distress, appears stated age Head: Normocephalic, without obvious abnormality, atraumatic Eyes: PERRL, conjunctiva/corneas clear, EOM's intact, fundi benign Ears: Normal TM's and external ear canals Nose: Nares normal, mucosa normal, no drainage or sinus   tenderness Throat: Lips, mucosa, and tongue normal; teeth and gums normal Neck: Supple, no lymphadenopathy, thyroid:no enlargement/tenderness/ no carotid bruit or JVD.  Round smooth movable 1 and half centimeter lesion noted on the neck. Lungs: Clear to auscultation bilaterally without wheezes, rales or ronchi; respirations unlabored Heart: Regular rate and rhythm, S1 and S2 normal, no murmur, rub or gallop Abdomen: Soft, non-tender, nondistended, normoactive bowel sounds, no masses, no hepatosplenomegaly Extremities: No clubbing, cyanosis or edema Pulses: 2+ and symmetric all extremities Skin: Skin color, texture, turgor normal, no rashes or lesions Lymph nodes: Cervical, supraclavicular, and axillary nodes normal Neurologic: CNII-XII intact, normal strength, sensation and gait; reflexes 2+ and symmetric throughout   Psych: Normal mood, affect, hygiene and grooming EKG shows no acute changes.  Essentially unchanged from previous reading. ASSESSMENT/PLAN: Routine general medical examination at a health care facility - Plan: CBC with Differential/Platelet, Comprehensive metabolic panel, Lipid panel  Hyperlipidemia, unspecified hyperlipidemia type - Plan: Lipid panel  Statin intolerance:  Recommend any check with [place] in January to see if the cost changes.  Essential hypertension - Plan: CBC with Differential/Platelet, Comprehensive metabolic panel, amLODipine (NORVASC) 5 MG tablet, hydrochlorothiazide (MICROZIDE) 12.5 MG capsule, metoprolol succinate (TOPROL-XL) 50 MG 24 hr tablet, ramipril (ALTACE) 10 MG capsule  Renal insufficiency - Plan: CBC with Differential/Platelet, Comprehensive metabolic panel  Gastroesophageal reflux disease without esophagitis : Continue on Pepcid.  ASHD (arteriosclerotic heart disease) - Plan: CBC with Differential/Platelet, Comprehensive metabolic panel, Lipid panel, amLODipine (NORVASC) 5 MG tablet, hydrochlorothiazide (MICROZIDE) 12.5 MG capsule, metoprolol succinate (TOPROL-XL) 50 MG 24 hr tablet, ramipril (ALTACE) 10 MG capsule: At this point he states he is not interested in seeing Dr. [place] since he is doing fairly well.  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 Discussed PSA screening (risks/benefits), recommended at least 30 minutes of aerobic activity at least 5 days/week;  Immunization recommendations discussed.  Colonoscopy recommendations reviewed.   Medicare Attestation I have personally reviewed: The patient's medical and social history Their use of alcohol, tobacco or illicit drugs Their current medications and supplements The patient's functional ability including ADLs,fall risks, home safety risks, cognitive, and hearing and  visual impairment Diet and physical  activities Evidence for depression or mood disorders  The patient's weight, height, and BMI have been recorded in the chart.  I have made referrals, counseling, and provided education to the patient based on review of the above and I have provided the patient with a written personalized care plan for preventive services.     Sharlot Gowda, MD   01/18/2019

## 2019-01-18 NOTE — Patient Instructions (Signed)
  Chase Wright , Thank you for taking time to come for your Medicare Wellness Visit. I appreciate your ongoing commitment to your health goals. Please review the following plan we discussed and let me know if I can assist you in the future.   These are the goals we discussed: Continue on your present medications.  Check with [place] at the beginning of the year and see if the insurance change in regard to cost of your Repatha.  Definitely work on getting more physically active. This is a list of the screening recommended for you and due dates:  Health Maintenance  Topic Date Due  . Cologuard (Stool DNA test)  10/18/2019  . Tetanus Vaccine  11/21/2026  . Flu Shot  Completed  .  Hepatitis C: One time screening is recommended by Center for Disease Control  (CDC) for  adults born from [birth year] through 1963.   Completed  . Pneumonia vaccines  Discontinued

## 2019-01-19 LAB — COMPREHENSIVE METABOLIC PANEL
ALT: 19 IU/L (ref 0–44)
AST: 19 IU/L (ref 0–40)
Albumin/Globulin Ratio: 1.7 (ref 1.2–2.2)
Albumin: 4.5 g/dL (ref 3.7–4.7)
Alkaline Phosphatase: 67 IU/L (ref 39–117)
BUN/Creatinine Ratio: 14 (ref 10–24)
BUN: 19 mg/dL (ref 8–27)
Bilirubin Total: 0.7 mg/dL (ref 0.0–1.2)
CO2: 22 mmol/L (ref 20–29)
Calcium: 9.7 mg/dL (ref 8.6–10.2)
Chloride: 100 mmol/L (ref 96–106)
Creatinine, Ser: 1.37 mg/dL — ABNORMAL HIGH (ref 0.76–1.27)
GFR calc Af Amer: 60 mL/min/{1.73_m2} (ref >59)
GFR calc non Af Amer: 52 mL/min/{1.73_m2} — ABNORMAL LOW (ref >59)
Globulin, Total: 2.7 g/dL (ref 1.5–4.5)
Glucose: 83 mg/dL (ref 65–99)
Potassium: 5.4 mmol/L — ABNORMAL HIGH (ref 3.5–5.2)
Sodium: 136 mmol/L (ref 134–144)
Total Protein: 7.2 g/dL (ref 6.0–8.5)

## 2019-01-19 LAB — CBC WITH DIFFERENTIAL/PLATELET
Basophils Absolute: 0.1 10*3/uL (ref 0.0–0.2)
Basos: 1 %
EOS (ABSOLUTE): 0.4 10*3/uL (ref 0.0–0.4)
Eos: 5 %
Hematocrit: 47.3 % (ref 37.5–51.0)
Hemoglobin: 16.7 g/dL (ref 13.0–17.7)
Immature Grans (Abs): 0 10*3/uL (ref 0.0–0.1)
Immature Granulocytes: 0 %
Lymphocytes Absolute: 2.7 10*3/uL (ref 0.7–3.1)
Lymphs: 34 %
MCH: 34 pg — ABNORMAL HIGH (ref 26.6–33.0)
MCHC: 35.3 g/dL (ref 31.5–35.7)
MCV: 96 fL (ref 79–97)
Monocytes Absolute: 0.6 10*3/uL (ref 0.1–0.9)
Monocytes: 7 %
Neutrophils Absolute: 4.2 10*3/uL (ref 1.4–7.0)
Neutrophils: 53 %
Platelets: 265 10*3/uL (ref 150–450)
RBC: 4.91 x10E6/uL (ref 4.14–5.80)
RDW: 12.8 % (ref 11.6–15.4)
WBC: 8 10*3/uL (ref 3.4–10.8)

## 2019-01-19 LAB — LIPID PANEL
Chol/HDL Ratio: 7 ratio — ABNORMAL HIGH (ref 0.0–5.0)
Cholesterol, Total: 223 mg/dL — ABNORMAL HIGH (ref 100–199)
HDL: 32 mg/dL — ABNORMAL LOW (ref >39)
LDL Chol Calc (NIH): 134 mg/dL — ABNORMAL HIGH (ref 0–99)
Triglycerides: 313 mg/dL — ABNORMAL HIGH (ref 0–149)
VLDL Cholesterol Cal: 57 mg/dL — ABNORMAL HIGH (ref 5–40)

## 2019-01-19 LAB — URIC ACID: Uric Acid: 4.8 mg/dL (ref 3.7–8.6)

## 2019-01-19 NOTE — Addendum Note (Signed)
Addended by: Denita Lung on: 01/19/2019 01:44 PM   Modules accepted: Orders

## 2019-01-21 MED ORDER — VASCEPA 1 G PO CAPS: 2.0000 | ORAL_CAPSULE | Freq: Two times a day (BID) | ORAL | 5 refills | Status: DC

## 2019-01-21 NOTE — Addendum Note (Signed)
Addended by: Denita Lung on: 01/21/2019 01:14 PM   Modules accepted: Orders

## 2019-11-08 ENCOUNTER — Encounter: Payer: Self-pay | Admitting: Family Medicine

## 2020-01-14 ENCOUNTER — Other Ambulatory Visit: Payer: Self-pay | Admitting: Family Medicine

## 2020-01-14 DIAGNOSIS — I2510 Atherosclerotic heart disease of native coronary artery without angina pectoris: Secondary | ICD-10-CM

## 2020-01-14 DIAGNOSIS — I10 Essential (primary) hypertension: Secondary | ICD-10-CM

## 2020-01-14 NOTE — Telephone Encounter (Signed)
Has upcoming appointment

## 2020-01-27 ENCOUNTER — Ambulatory Visit (INDEPENDENT_AMBULATORY_CARE_PROVIDER_SITE_OTHER): Payer: Medicare Other | Admitting: Family Medicine

## 2020-01-27 ENCOUNTER — Other Ambulatory Visit: Payer: Self-pay

## 2020-01-27 ENCOUNTER — Encounter: Payer: Self-pay | Admitting: Family Medicine

## 2020-01-27 VITALS — BP 122/80 | HR 63 | Temp 98.9°F | Ht 63.0 in | Wt 244.8 lb

## 2020-01-27 DIAGNOSIS — Z125 Encounter for screening for malignant neoplasm of prostate: Secondary | ICD-10-CM

## 2020-01-27 DIAGNOSIS — Z789 Other specified health status: Secondary | ICD-10-CM | POA: Diagnosis not present

## 2020-01-27 DIAGNOSIS — Z8739 Personal history of other diseases of the musculoskeletal system and connective tissue: Secondary | ICD-10-CM | POA: Diagnosis not present

## 2020-01-27 DIAGNOSIS — Z87442 Personal history of urinary calculi: Secondary | ICD-10-CM

## 2020-01-27 DIAGNOSIS — E785 Hyperlipidemia, unspecified: Secondary | ICD-10-CM | POA: Diagnosis not present

## 2020-01-27 DIAGNOSIS — N289 Disorder of kidney and ureter, unspecified: Secondary | ICD-10-CM

## 2020-01-27 DIAGNOSIS — Z0000 Encounter for general adult medical examination without abnormal findings: Secondary | ICD-10-CM | POA: Diagnosis not present

## 2020-01-27 DIAGNOSIS — N401 Enlarged prostate with lower urinary tract symptoms: Secondary | ICD-10-CM

## 2020-01-27 DIAGNOSIS — Z86010 Personal history of colonic polyps: Secondary | ICD-10-CM

## 2020-01-27 DIAGNOSIS — I2510 Atherosclerotic heart disease of native coronary artery without angina pectoris: Secondary | ICD-10-CM | POA: Diagnosis not present

## 2020-01-27 DIAGNOSIS — Z1211 Encounter for screening for malignant neoplasm of colon: Secondary | ICD-10-CM

## 2020-01-27 DIAGNOSIS — K219 Gastro-esophageal reflux disease without esophagitis: Secondary | ICD-10-CM | POA: Diagnosis not present

## 2020-01-27 DIAGNOSIS — I10 Essential (primary) hypertension: Secondary | ICD-10-CM | POA: Diagnosis not present

## 2020-01-27 DIAGNOSIS — I700 Atherosclerosis of aorta: Secondary | ICD-10-CM

## 2020-01-27 DIAGNOSIS — M1990 Unspecified osteoarthritis, unspecified site: Secondary | ICD-10-CM

## 2020-01-27 DIAGNOSIS — T466X5A Adverse effect of antihyperlipidemic and antiarteriosclerotic drugs, initial encounter: Secondary | ICD-10-CM

## 2020-01-27 DIAGNOSIS — N3281 Overactive bladder: Secondary | ICD-10-CM | POA: Insufficient documentation

## 2020-01-27 DIAGNOSIS — M791 Myalgia, unspecified site: Secondary | ICD-10-CM

## 2020-01-27 MED ORDER — AMLODIPINE BESYLATE 5 MG PO TABS: 5.0000 mg | ORAL_TABLET | Freq: Every day | ORAL | 3 refills | Status: DC

## 2020-01-27 MED ORDER — ALLOPURINOL 300 MG PO TABS: 300.0000 mg | ORAL_TABLET | Freq: Every day | ORAL | 3 refills | Status: DC

## 2020-01-27 MED ORDER — PRALUENT 75 MG/ML ~~LOC~~ SOAJ: 1.0000 "application " | SUBCUTANEOUS | 3 refills | Status: DC

## 2020-01-27 MED ORDER — RAMIPRIL 10 MG PO CAPS: 10.0000 mg | ORAL_CAPSULE | Freq: Every day | ORAL | 3 refills | Status: DC

## 2020-01-27 MED ORDER — HYDROCHLOROTHIAZIDE 12.5 MG PO CAPS: ORAL_CAPSULE | ORAL | 3 refills | Status: DC

## 2020-01-27 MED ORDER — METOPROLOL SUCCINATE ER 50 MG PO TB24: ORAL_TABLET | ORAL | 3 refills | Status: DC

## 2020-01-27 MED ORDER — ALFUZOSIN HCL ER 10 MG PO TB24: 10.0000 mg | ORAL_TABLET | Freq: Every day | ORAL | 3 refills | Status: DC

## 2020-01-27 NOTE — Patient Instructions (Signed)
  Chase Wright , Thank you for taking time to come for your Medicare Wellness Visit. I appreciate your ongoing commitment to your health goals. Please review the following plan we discussed and let me know if I can assist you in the future.   These are the goals we discussed: 20 minutes of something physical daily and cut back on carbohydrates is this way to remember that is white food This is a list of the screening recommended for you and due dates:  Health Maintenance  Topic Date Due  . Cologuard (Stool DNA test)  10/18/2019  . Tetanus Vaccine  11/21/2026  . Flu Shot  Completed  . COVID-19 Vaccine  Completed  .  Hepatitis C: One time screening is recommended by Center for Disease Control  (CDC) for  adults born from [birth year] through 1963.   Completed  . Pneumonia vaccines  Discontinued

## 2020-01-27 NOTE — Progress Notes (Signed)
Chase Wright. is a 74 y.o. male who presents for annual wellness visit,CPE and follow-up on chronic medical conditions.  He has had difficulty with a cough that he states only occurs when he is on the phone talking. He has seen pulmonary for a lesion that was biopsied which was negative. He does have underlying ASHD and has had stents but has had no chest pain, shortness of breath, PND. Does have x-ray evidence of atherosclerosis . He is statin intolerant and had side effects from Repatha as well as Vascepa. Does have a previous history of colonic polyps but did have a Cologuard done 3 years ago which was negative. He was not interested in colonoscopy. He does have a history of reflux disease as well as PUD and presently is taking Pepcid with good results. He continues on allopurinol and is having no difficulty with that. Does have x-ray evidence of CKD. He does have a previous history of gout and presently is doing well on allopurinol. He does complain of arthritis type symptoms but presently is on no medications. He does have OAB and is been stable on Uroxatrol for several years.   Immunizations and Health Maintenance Immunization History  Administered Date(s) Administered  . DTaP 07/24/1992  . Influenza Split 01/06/2012, 01/17/2013, 02/22/2014  . Influenza Whole 01/19/1991, 02/11/2006, 12/31/2007  . Influenza, High Dose Seasonal PF 12/09/2013, 11/20/2016, 12/24/2017, 12/01/2018, 12/01/2018  . Influenza-Unspecified 01/06/2015, 12/31/2015, 11/20/2016, 11/19/2019  . PFIZER SARS-COV-2 Vaccination 06/08/2019, 07/06/2019, 01/06/2020  . Pneumococcal Conjugate-13 10/24/2016  . Pneumococcal Polysaccharide-23 10/06/2002, 02/25/2012  . Tdap 01/06/2007, 11/20/2016  . Zoster 10/06/2002  . Zoster Recombinat (Shingrix) 11/20/2016, 05/13/2017   Health Maintenance Due  Topic Date Due  . Fecal DNA (Cologuard)  10/18/2019    Last colonoscopy:cologuard 10/17/16 Last PSA: 02/25/12 Dentist: Q  year Ophtho: Q year Exercise: N/A    Other doctors caring for patient include: Dr. Alexandria Lodge, Dr. Arlyce Dice GI  Advanced Directives: Does Patient Have a Medical Advance Directive?: Yes Type of Advance Directive: Healthcare Power of Attorney Copy of Healthcare Power of Attorney in Chart?: Yes - validated most recent copy scanned in chart (See row information)  Depression screen:  See questionnaire below.     Depression screen [facility] 2/9 01/27/2020 01/18/2019 11/04/2017 10/24/2016 07/25/2016  Decreased Interest 0 0 0 0 0  Down, Depressed, Hopeless 0 0 0 0 0  PHQ - 2 Score 0 0 0 0 0    Fall Screen: See Questionaire below.   Fall Risk  01/27/2020 01/18/2019 10/24/2016 07/25/2016 05/26/2015  Falls in the past year? 1 0 No No Yes  Number falls in past yr: 1 - - - -  Injury with Fall? 0 - - - Yes    ADL screen:  See questionnaire below.  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) Does the patient have difficulty seeing, even when wearing glasses/contacts?: No Does the patient have difficulty concentrating, remembering, or making decisions?: No Does the patient have difficulty walking or climbing stairs?: No Does the patient have difficulty dressing or bathing?: No Does the patient have difficulty doing errands alone such as visiting a doctor's office or shopping?: No   Review of Systems  Constitutional: -, -unexpected weight change, -anorexia, -fatigue Allergy: -sneezing, -itching, -congestion Dermatology: denies changing moles, rash, lumps ENT: -runny nose, -ear pain, -sore throat,  Cardiology:  -chest pain, -palpitations, -orthopnea, Respiratory:, -shortness of breath, -dyspnea on exertion, -wheezing,  Gastroenterology: -abdominal pain, -nausea, -vomiting, -diarrhea, -  constipation, -dysphagia Hematology: -bleeding or bruising problems Musculoskeletal: -arthralgias, -myalgias, -joint swelling, -back pain, - Ophthalmology: -vision  changes,  Urology: -dysuria, -difficulty urinating,  -urinary frequency, -urgency, incontinence Neurology: -, -numbness, , -memory loss, -falls, -dizziness    PHYSICAL EXAM:   General Appearance: Alert, cooperative, no distress, appears stated age Head: Normocephalic, without obvious abnormality, atraumatic Eyes: PERRL, conjunctiva/corneas clear, EOM's intact,  Ears: Normal TM's and external ear canals Nose: Nares normal, mucosa normal, no drainage or sinus   tenderness Throat: Lips, mucosa, and tongue normal; teeth and gums normal Neck: Supple, no lymphadenopathy, thyroid:no enlargement/tenderness/nodules; no carotid bruit or JVD Lungs: Clear to auscultation bilaterally without wheezes, rales or ronchi; respirations unlabored Heart: Regular rate and rhythm, S1 and S2 normal, no murmur, rub or gallop Abdomen: Soft, non-tender, nondistended, normoactive bowel sounds, no masses, no hepatosplenomegaly Extremities: No clubbing, cyanosis or edema Pulses: 2+ and symmetric all extremities Skin: Skin color, texture, turgor normal, no rashes or lesions Lymph nodes: Cervical, supraclavicular, and axillary nodes normal Neurologic: CNII-XII intact, normal strength, sensation and gait; reflexes 2+ and symmetric throughout   Psych: Normal mood, affect, hygiene and grooming  ASSESSMENT/PLAN: Hyperlipidemia, unspecified hyperlipidemia type - Plan: Lipid panel, Alirocumab (PRALUENT) 75 MG/ML SOAJ  Statin intolerance - Plan: Alirocumab (PRALUENT) 75 MG/ML SOAJ  Primary hypertension  Renal insufficiency - Plan: Comprehensive metabolic panel  Gastroesophageal reflux disease without esophagitis  ASHD (arteriosclerotic heart disease) - Plan: Lipid panel, Comprehensive metabolic panel, CBC with Differential/Platelet, ramipril (ALTACE) 10 MG capsule, metoprolol succinate (TOPROL-XL) 50 MG 24 hr tablet, hydrochlorothiazide (MICROZIDE) 12.5 MG capsule, amLODipine (NORVASC) 5 MG tablet, Alirocumab  (PRALUENT) 75 MG/ML SOAJ  History of gout - Plan: Uric Acid, allopurinol (ZYLOPRIM) 300 MG tablet  History of colonic polyps  History of renal stone  Benign prostatic hyperplasia with lower urinary tract symptoms, symptom details unspecified - Plan: alfuzosin (UROXATRAL) 10 MG 24 hr tablet  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 Strongly encouraged him to make diet and exercise changes by cutting back on carbohydrates as well as starting an exercise program of walking daily for 20 minutes. I will attempt to get him on Praluent and see if that will help with his lipids. If not we will consider using Zetia. Cologuard ordered since he is not interested in having a colonoscopy  PSA was ordered at his request.   Discussed PSA screening (risks/benefits), recommended at least 30 minutes of aerobic activity at least 5 days/week;  healthy diet  reviewed;  Immunization recommendations discussed.  Colonoscopy recommendations reviewed.   Medicare Attestation I have personally reviewed: The patient's medical and social history Their use of alcohol, tobacco or illicit drugs Their current medications and supplements The patient's functional ability including ADLs,fall risks, home safety risks, cognitive, and hearing and visual impairment Diet and physical activities Evidence for depression or mood disorders  The patient's weight, height, and BMI have been recorded in the chart.  I have made referrals, counseling, and provided education to the patient based on review of the above and I have provided the patient with a written personalized care plan for preventive services.     Sharlot Gowda,  MD   01/27/2020

## 2020-01-28 LAB — COMPREHENSIVE METABOLIC PANEL WITH GFR
ALT: 13 IU/L (ref 0–44)
AST: 17 IU/L (ref 0–40)
Albumin/Globulin Ratio: 1.6 (ref 1.2–2.2)
Albumin: 4.4 g/dL (ref 3.7–4.7)
Alkaline Phosphatase: 62 IU/L (ref 44–121)
BUN/Creatinine Ratio: 12 (ref 10–24)
BUN: 19 mg/dL (ref 8–27)
Bilirubin Total: 0.4 mg/dL (ref 0.0–1.2)
CO2: 22 mmol/L (ref 20–29)
Calcium: 9.8 mg/dL (ref 8.6–10.2)
Chloride: 100 mmol/L (ref 96–106)
Creatinine, Ser: 1.53 mg/dL — ABNORMAL HIGH (ref 0.76–1.27)
GFR calc Af Amer: 52 mL/min/1.73 — ABNORMAL LOW
GFR calc non Af Amer: 45 mL/min/1.73 — ABNORMAL LOW
Globulin, Total: 2.7 g/dL (ref 1.5–4.5)
Glucose: 86 mg/dL (ref 65–99)
Potassium: 4.6 mmol/L (ref 3.5–5.2)
Sodium: 138 mmol/L (ref 134–144)
Total Protein: 7.1 g/dL (ref 6.0–8.5)

## 2020-01-28 LAB — CBC WITH DIFFERENTIAL/PLATELET
Basophils Absolute: 0.1 10*3/uL (ref 0.0–0.2)
Basos: 1 %
EOS (ABSOLUTE): 0.4 10*3/uL (ref 0.0–0.4)
Eos: 5 %
Hematocrit: 47.4 % (ref 37.5–51.0)
Hemoglobin: 16.4 g/dL (ref 13.0–17.7)
Immature Grans (Abs): 0 10*3/uL (ref 0.0–0.1)
Immature Granulocytes: 1 %
Lymphocytes Absolute: 2.2 10*3/uL (ref 0.7–3.1)
Lymphs: 27 %
MCH: 33.5 pg — ABNORMAL HIGH (ref 26.6–33.0)
MCHC: 34.6 g/dL (ref 31.5–35.7)
MCV: 97 fL (ref 79–97)
Monocytes Absolute: 0.7 10*3/uL (ref 0.1–0.9)
Monocytes: 8 %
Neutrophils Absolute: 4.9 10*3/uL (ref 1.4–7.0)
Neutrophils: 58 %
Platelets: 268 10*3/uL (ref 150–450)
RBC: 4.89 x10E6/uL (ref 4.14–5.80)
RDW: 12.6 % (ref 11.6–15.4)
WBC: 8.2 10*3/uL (ref 3.4–10.8)

## 2020-01-28 LAB — URIC ACID: Uric Acid: 5 mg/dL (ref 3.8–8.4)

## 2020-01-28 LAB — LIPID PANEL
Chol/HDL Ratio: 6.2 ratio — ABNORMAL HIGH (ref 0.0–5.0)
Cholesterol, Total: 203 mg/dL — ABNORMAL HIGH (ref 100–199)
HDL: 33 mg/dL — ABNORMAL LOW (ref >39)
LDL Chol Calc (NIH): 144 mg/dL — ABNORMAL HIGH (ref 0–99)
Triglycerides: 140 mg/dL (ref 0–149)
VLDL Cholesterol Cal: 26 mg/dL (ref 5–40)

## 2020-01-28 LAB — PSA: Prostate Specific Ag, Serum: 3.1 ng/mL (ref 0.0–4.0)

## 2020-02-02 ENCOUNTER — Telehealth: Payer: Self-pay

## 2020-02-02 NOTE — Telephone Encounter (Signed)
P.A. PRALUENT  

## 2020-02-12 NOTE — Telephone Encounter (Signed)
P.A. approved went thru for $47, left message for pt

## 2020-02-15 ENCOUNTER — Telehealth: Payer: Self-pay

## 2020-02-15 NOTE — Telephone Encounter (Signed)
Left another message to let pt know Praluent approved

## 2020-03-20 ENCOUNTER — Other Ambulatory Visit: Payer: Self-pay

## 2020-03-20 ENCOUNTER — Encounter: Payer: Self-pay | Admitting: Family Medicine

## 2020-03-20 ENCOUNTER — Ambulatory Visit (INDEPENDENT_AMBULATORY_CARE_PROVIDER_SITE_OTHER): Payer: Medicare Other | Admitting: Family Medicine

## 2020-03-20 VITALS — BP 130/88 | HR 69 | Temp 99.2°F | Wt 246.4 lb

## 2020-03-20 DIAGNOSIS — K5900 Constipation, unspecified: Secondary | ICD-10-CM

## 2020-03-20 NOTE — Progress Notes (Signed)
   Subjective:    Patient ID: Chase Fort., male    DOB: Nov 04, 1945, 74 y.o.   MRN: [id number]  HPI He is concerned over his bowel habits.  Apparently he has a very irregular bowel habits and sometimes has a significant amount of pain if he goes for several days without a BM but then after he has 1, the pain goes away.  He has had previous Cologuard which was negative.  He admits to a very sedentary lifestyle and that is he eating habits are quite poor.   Review of Systems     Objective:   Physical Exam Alert and in no distress.  Abdominal exam shows active bowel sounds without masses or tenderness.       Assessment & Plan:  Constipation, unspecified constipation type Information given concerning constipation with him.  Recommend MiraLAX to help keep him regular as well as increase physical activity.

## 2020-03-20 NOTE — Patient Instructions (Addendum)

## 2020-07-11 ENCOUNTER — Emergency Department (HOSPITAL_COMMUNITY): Payer: Medicare Other

## 2020-07-11 ENCOUNTER — Other Ambulatory Visit: Payer: Self-pay

## 2020-07-11 ENCOUNTER — Observation Stay (HOSPITAL_COMMUNITY)
Admission: EM | Admit: 2020-07-11 | Discharge: 2020-07-13 | Disposition: A | Discharge status: Home / Self Care | Payer: Medicare Other | Attending: Internal Medicine | Admitting: Internal Medicine

## 2020-07-11 ENCOUNTER — Ambulatory Visit (INDEPENDENT_AMBULATORY_CARE_PROVIDER_SITE_OTHER): Payer: Medicare Other | Admitting: Family Medicine

## 2020-07-11 ENCOUNTER — Encounter: Payer: Self-pay | Admitting: Family Medicine

## 2020-07-11 ENCOUNTER — Encounter (HOSPITAL_COMMUNITY): Payer: Self-pay | Admitting: Emergency Medicine

## 2020-07-11 VITALS — HR 78 | Temp 99.3°F | Wt 239.0 lb

## 2020-07-11 DIAGNOSIS — R197 Diarrhea, unspecified: Secondary | ICD-10-CM | POA: Diagnosis not present

## 2020-07-11 DIAGNOSIS — Z6841 Body Mass Index (BMI) 40.0 and over, adult: Secondary | ICD-10-CM | POA: Diagnosis not present

## 2020-07-11 DIAGNOSIS — I2510 Atherosclerotic heart disease of native coronary artery without angina pectoris: Secondary | ICD-10-CM | POA: Diagnosis present

## 2020-07-11 DIAGNOSIS — N189 Chronic kidney disease, unspecified: Secondary | ICD-10-CM | POA: Diagnosis not present

## 2020-07-11 DIAGNOSIS — I129 Hypertensive chronic kidney disease with stage 1 through stage 4 chronic kidney disease, or unspecified chronic kidney disease: Secondary | ICD-10-CM | POA: Diagnosis not present

## 2020-07-11 DIAGNOSIS — Z20822 Contact with and (suspected) exposure to covid-19: Secondary | ICD-10-CM | POA: Insufficient documentation

## 2020-07-11 DIAGNOSIS — K5790 Diverticulosis of intestine, part unspecified, without perforation or abscess without bleeding: Secondary | ICD-10-CM | POA: Diagnosis not present

## 2020-07-11 DIAGNOSIS — I959 Hypotension, unspecified: Secondary | ICD-10-CM | POA: Diagnosis not present

## 2020-07-11 DIAGNOSIS — R079 Chest pain, unspecified: Principal | ICD-10-CM | POA: Diagnosis present

## 2020-07-11 DIAGNOSIS — J449 Chronic obstructive pulmonary disease, unspecified: Secondary | ICD-10-CM | POA: Diagnosis not present

## 2020-07-11 DIAGNOSIS — R0789 Other chest pain: Secondary | ICD-10-CM | POA: Diagnosis not present

## 2020-07-11 DIAGNOSIS — Z23 Encounter for immunization: Secondary | ICD-10-CM | POA: Insufficient documentation

## 2020-07-11 DIAGNOSIS — S50311A Abrasion of right elbow, initial encounter: Secondary | ICD-10-CM

## 2020-07-11 DIAGNOSIS — I10 Essential (primary) hypertension: Secondary | ICD-10-CM | POA: Diagnosis present

## 2020-07-11 DIAGNOSIS — Z79899 Other long term (current) drug therapy: Secondary | ICD-10-CM | POA: Insufficient documentation

## 2020-07-11 DIAGNOSIS — I700 Atherosclerosis of aorta: Secondary | ICD-10-CM | POA: Insufficient documentation

## 2020-07-11 DIAGNOSIS — M7989 Other specified soft tissue disorders: Secondary | ICD-10-CM | POA: Diagnosis not present

## 2020-07-11 DIAGNOSIS — K5750 Diverticulosis of both small and large intestine without perforation or abscess without bleeding: Secondary | ICD-10-CM | POA: Diagnosis not present

## 2020-07-11 DIAGNOSIS — K219 Gastro-esophageal reflux disease without esophagitis: Secondary | ICD-10-CM | POA: Diagnosis not present

## 2020-07-11 DIAGNOSIS — Z743 Need for continuous supervision: Secondary | ICD-10-CM | POA: Diagnosis not present

## 2020-07-11 DIAGNOSIS — N281 Cyst of kidney, acquired: Secondary | ICD-10-CM | POA: Diagnosis not present

## 2020-07-11 DIAGNOSIS — E669 Obesity, unspecified: Secondary | ICD-10-CM | POA: Insufficient documentation

## 2020-07-11 DIAGNOSIS — M25521 Pain in right elbow: Secondary | ICD-10-CM | POA: Diagnosis not present

## 2020-07-11 DIAGNOSIS — Z951 Presence of aortocoronary bypass graft: Secondary | ICD-10-CM | POA: Diagnosis not present

## 2020-07-11 DIAGNOSIS — S6991XA Unspecified injury of right wrist, hand and finger(s), initial encounter: Secondary | ICD-10-CM | POA: Diagnosis not present

## 2020-07-11 DIAGNOSIS — N261 Atrophy of kidney (terminal): Secondary | ICD-10-CM | POA: Diagnosis not present

## 2020-07-11 DIAGNOSIS — R109 Unspecified abdominal pain: Secondary | ICD-10-CM | POA: Diagnosis not present

## 2020-07-11 DIAGNOSIS — R6889 Other general symptoms and signs: Secondary | ICD-10-CM | POA: Diagnosis not present

## 2020-07-11 DIAGNOSIS — M19041 Primary osteoarthritis, right hand: Secondary | ICD-10-CM | POA: Diagnosis not present

## 2020-07-11 LAB — TROPONIN I (HIGH SENSITIVITY)
Troponin I (High Sensitivity): 20 ng/L — ABNORMAL HIGH (ref <18)
Troponin I (High Sensitivity): 20 ng/L — ABNORMAL HIGH (ref <18)
Troponin I (High Sensitivity): 18 ng/L — ABNORMAL HIGH (ref <18)
Troponin I (High Sensitivity): 18 ng/L — ABNORMAL HIGH (ref <18)

## 2020-07-11 LAB — LIPASE, BLOOD: Lipase: 48 U/L (ref 11–51)

## 2020-07-11 LAB — COMPREHENSIVE METABOLIC PANEL
ALT: 19 U/L (ref 0–44)
AST: 21 U/L (ref 15–41)
Albumin: 3.6 g/dL (ref 3.5–5.0)
Alkaline Phosphatase: 43 U/L (ref 38–126)
Anion gap: 10 (ref 5–15)
BUN: 36 mg/dL — ABNORMAL HIGH (ref 8–23)
CO2: 25 mmol/L (ref 22–32)
Calcium: 9.4 mg/dL (ref 8.9–10.3)
Chloride: 100 mmol/L (ref 98–111)
Creatinine, Ser: 1.75 mg/dL — ABNORMAL HIGH (ref 0.61–1.24)
GFR, Estimated: 41 mL/min — ABNORMAL LOW (ref >60)
Glucose, Bld: 94 mg/dL (ref 70–99)
Potassium: 4.5 mmol/L (ref 3.5–5.1)
Sodium: 135 mmol/L (ref 135–145)
Total Bilirubin: 0.9 mg/dL (ref 0.3–1.2)
Total Protein: 6.5 g/dL (ref 6.5–8.1)

## 2020-07-11 LAB — CBC
HCT: 44 % (ref 39.0–52.0)
Hemoglobin: 15.1 g/dL (ref 13.0–17.0)
MCH: 34.3 pg — ABNORMAL HIGH (ref 26.0–34.0)
MCHC: 34.3 g/dL (ref 30.0–36.0)
MCV: 100 fL (ref 80.0–100.0)
Platelets: 277 10*3/uL (ref 150–400)
RBC: 4.4 MIL/uL (ref 4.22–5.81)
RDW: 13.1 % (ref 11.5–15.5)
WBC: 16.7 10*3/uL — ABNORMAL HIGH (ref 4.0–10.5)
nRBC: 0 % (ref 0.0–0.2)

## 2020-07-11 MED ORDER — TETANUS-DIPHTH-ACELL PERTUSSIS 5-2.5-18.5 LF-MCG/0.5 IM SUSY
0.5000 mL | PREFILLED_SYRINGE | Freq: Once | INTRAMUSCULAR | Status: AC
  Administered 2020-07-11: 0.5 mL via INTRAMUSCULAR
  Filled 2020-07-11: qty 0.5

## 2020-07-11 MED ORDER — ALLOPURINOL 300 MG PO TABS
300.0000 mg | ORAL_TABLET | Freq: Every day | ORAL | Status: DC
  Administered 2020-07-11 – 2020-07-12 (×2): 300 mg via ORAL
  Filled 2020-07-11: qty 3
  Filled 2020-07-11: qty 1

## 2020-07-11 MED ORDER — RAMIPRIL 10 MG PO CAPS
10.0000 mg | ORAL_CAPSULE | Freq: Every day | ORAL | Status: DC
  Administered 2020-07-11: 10 mg via ORAL
  Filled 2020-07-11: qty 1

## 2020-07-11 MED ORDER — PANTOPRAZOLE SODIUM 40 MG PO TBEC
40.0000 mg | DELAYED_RELEASE_TABLET | Freq: Every day | ORAL | Status: DC
  Administered 2020-07-11 – 2020-07-13 (×3): 40 mg via ORAL
  Filled 2020-07-11 (×3): qty 1

## 2020-07-11 MED ORDER — HYDROCHLOROTHIAZIDE 12.5 MG PO CAPS
12.5000 mg | ORAL_CAPSULE | Freq: Every day | ORAL | Status: DC
  Administered 2020-07-11: 12.5 mg via ORAL
  Filled 2020-07-11: qty 1

## 2020-07-11 MED ORDER — FAMOTIDINE 20 MG PO TABS
20.0000 mg | ORAL_TABLET | Freq: Every day | ORAL | Status: DC
  Administered 2020-07-12 – 2020-07-13 (×2): 20 mg via ORAL
  Filled 2020-07-11 (×2): qty 1

## 2020-07-11 MED ORDER — ONDANSETRON HCL 4 MG/2ML IJ SOLN: 4.0000 mg | Freq: Four times a day (QID) | INTRAMUSCULAR | Status: DC | PRN

## 2020-07-11 MED ORDER — METOPROLOL SUCCINATE ER 25 MG PO TB24
50.0000 mg | ORAL_TABLET | Freq: Every day | ORAL | Status: DC
  Administered 2020-07-11: 50 mg via ORAL
  Filled 2020-07-11: qty 2

## 2020-07-11 MED ORDER — AMLODIPINE BESYLATE 5 MG PO TABS
5.0000 mg | ORAL_TABLET | Freq: Every day | ORAL | Status: DC
  Administered 2020-07-11: 5 mg via ORAL
  Filled 2020-07-11: qty 1

## 2020-07-11 MED ORDER — ACETAMINOPHEN 325 MG PO TABS
650.0000 mg | ORAL_TABLET | ORAL | Status: DC | PRN
  Administered 2020-07-12 – 2020-07-13 (×4): 650 mg via ORAL
  Filled 2020-07-11 (×4): qty 2

## 2020-07-11 MED ORDER — ENOXAPARIN SODIUM 40 MG/0.4ML ~~LOC~~ SOLN
40.0000 mg | SUBCUTANEOUS | Status: DC
  Administered 2020-07-12 – 2020-07-13 (×2): 40 mg via SUBCUTANEOUS
  Filled 2020-07-11 (×2): qty 0.4

## 2020-07-11 MED ORDER — LACTATED RINGERS IV BOLUS
500.0000 mL | Freq: Once | INTRAVENOUS | Status: AC
  Administered 2020-07-11: 500 mL via INTRAVENOUS

## 2020-07-11 MED ORDER — ASPIRIN 325 MG PO TABS
325.0000 mg | ORAL_TABLET | Freq: Every day | ORAL | Status: DC
  Administered 2020-07-11 – 2020-07-12 (×2): 325 mg via ORAL
  Filled 2020-07-11 (×2): qty 1

## 2020-07-11 MED ORDER — ALFUZOSIN HCL ER 10 MG PO TB24
10.0000 mg | ORAL_TABLET | Freq: Every day | ORAL | Status: DC
  Administered 2020-07-11 – 2020-07-12 (×2): 10 mg via ORAL
  Filled 2020-07-11 (×3): qty 1

## 2020-07-11 NOTE — ED Provider Notes (Signed)
[facility]   CSN: [id number] Arrival date & time: 07/11/20  1505     History Chief Complaint  Patient presents with  . Chest Pain  . Abdominal Pain    Chase FortJames I Kunath Jr. is a 75 y.o. male.  He is here for abdominal pain chest pain and low blood pressure.  He has had abdominal pain with constipation and diarrhea since December.  He was going to see his doctor today for same.  He tripped and fell getting an abrasion on his elbow and injuring his right middle finger.  While in in the office he experienced some substernal chest pain and had low blood pressure.  EMS was called.  They gave him 81 mg of aspirin.  He arrives here with normal blood pressure and no pain in his chest.  He still has his ongoing lower abdominal pain.  The history is provided by the patient.  Chest Pain Pain location:  Unable to specify Pain quality: aching   Pain radiates to:  Does not radiate Pain severity:  Moderate Onset quality:  Sudden Progression:  Resolved Chronicity:  New Context: at rest   Relieved by:  None tried Worsened by:  Nothing Ineffective treatments:  None tried Associated symptoms: abdominal pain and lower extremity edema   Associated symptoms: no back pain, no cough, no diaphoresis, no fever, no headache, no nausea, no shortness of breath and no vomiting   Abdominal Pain Pain location:  Generalized Pain quality: aching   Pain radiates to:  Scrotum Pain severity:  Moderate Onset quality:  Gradual Duration: months. Timing:  Intermittent Progression:  Unchanged Chronicity:  New Context: not trauma   Relieved by:  Nothing Worsened by:  Nothing Ineffective treatments:  None tried Associated symptoms: chest pain, constipation and diarrhea   Associated symptoms: no cough, no dysuria, no fever, no hematemesis, no hematochezia, no hematuria, no nausea, no shortness of breath, no sore throat and no vomiting        Past Medical  History:  Diagnosis Date  . ASHD (arteriosclerotic heart disease)   . Chronic kidney disease    RENAL STONES  . Diverticulitis   . GERD (gastroesophageal reflux disease)   . Hx of colonic polyps   . Hypertension   . Obesity   . PUD (peptic ulcer disease)   . Renal insufficiency     Patient Active Problem List   Diagnosis Date Noted  . Morbid obesity (HCC) 01/27/2020  . OAB (overactive bladder) 01/27/2020  . 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 Sebaceous cyst 01/18/2019  . Abdominal aortic atherosclerosis (HCC) 06/10/2017  . Arthritis 06/10/2017  . History of renal stone 07/25/2016  . Benign prostatic hyperplasia with lower urinary tract symptoms 07/25/2016  . Hypertension 02/25/2012  . Renal insufficiency 02/25/2012  . Gastroesophageal reflux disease without esophagitis 02/25/2012  . ASHD (arteriosclerotic heart disease) 02/25/2012  . History of gout 02/25/2012  . History of colonic polyps 02/25/2012  . Hyperlipidemia 02/19/2011  . Statin intolerance 02/19/2011    Past Surgical History:  Procedure Laterality Date  . 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 CARDIAC CATHETERIZATION  08/2005  . NASAL ENDOSCOPY  2008  . VIDEO BRONCHOSCOPY Bilateral 05/01/2018   Procedure: VIDEO BRONCHOSCOPY WITHOUT FLUORO;  Surgeon: Luciano CutterEllison, Chi Jane, MD;  Location: [facility];  Service: Cardiopulmonary;  Laterality: Bilateral;       Family History  Problem Relation Age of Onset  . Diabetes Mother   .  Prostate cancer Father 23  . Colon cancer Neg Hx     Social History   Tobacco Use  . Smoking status: Never Smoker  . Smokeless tobacco: Never Used  Vaping Use  . Vaping Use: Never used  Substance Use Topics  . Alcohol use: Yes    Comment: rare beer  . Drug use: No    Home Medications Prior to Admission medications   Medication Sig Start Date End Date Taking? Authorizing Provider  alfuzosin (UROXATRAL) 10 MG 24 hr tablet Take 1 tablet (10 mg total) by mouth daily.  01/27/20   Ronnald Nian, MD  Alirocumab (PRALUENT) 75 MG/ML SOAJ Inject 1 application into the skin every 14 (fourteen) days. Patient not taking: No sig reported 01/27/20   Ronnald Nian, MD  allopurinol (ZYLOPRIM) 300 MG tablet Take 1 tablet (300 mg total) by mouth daily. 01/27/20   Ronnald Nian, MD  amLODipine (NORVASC) 5 MG tablet Take 1 tablet (5 mg total) by mouth daily. 01/27/20   Ronnald Nian, MD  aspirin 325 MG tablet Take 325 mg by mouth daily.    [provider]  famotidine (PEPCID) 20 MG tablet Take 20 mg by mouth daily.     [provider]  hydrochlorothiazide (MICROZIDE) 12.5 MG capsule TAKE 1 CAPSULE BY MOUTH EVERY DAY 01/27/20   Ronnald Nian, MD  Icosapent Ethyl (VASCEPA) 1 g CAPS Take 2 capsules (2 g total) by mouth 2 (two) times daily. Patient not taking: No sig reported 01/21/19   Ronnald Nian, MD  metoprolol succinate (TOPROL-XL) 50 MG 24 hr tablet TAKE 1 TABLET BY MOUTH EVERY DAY WITH OR IMMEDIATELY FOLLOWING A MEAL 01/27/20   Ronnald Nian, MD  ramipril (ALTACE) 10 MG capsule Take 1 capsule (10 mg total) by mouth daily. 01/27/20   Ronnald Nian, MD  Respiratory Therapy Supplies (FLUTTER) DEVI Use device 2-3 times a day to break up congestion Patient not taking: No sig reported 04/20/18   Luciano Cutter, MD    Allergies    Statins  Review of Systems   Review of Systems  Constitutional: Negative for diaphoresis and fever.  HENT: Negative for sore throat.   Eyes: Negative for visual disturbance.  Respiratory: Negative for cough and shortness of breath.   Cardiovascular: Positive for chest pain.  Gastrointestinal: Positive for abdominal pain, constipation and diarrhea. Negative for hematemesis, hematochezia, nausea and vomiting.  Genitourinary: Negative for dysuria and hematuria.  Musculoskeletal: Negative for back pain.  Skin: Positive for wound. Negative for rash.  Neurological: Negative for headaches.    Physical  Exam Updated Vital Signs BP 118/74   Pulse 78   Resp 12   Ht  (1.6 m)   Wt 104.3 kg   SpO2 97%   BMI 40.74 kg/m   Physical Exam Vitals and nursing note reviewed.  Constitutional:      Appearance: He is well-developed. He is obese.  HENT:     Head: Normocephalic and atraumatic.  Eyes:     Conjunctiva/sclera: Conjunctivae normal.  Cardiovascular:     Rate and Rhythm: Normal rate and regular rhythm.     Heart sounds: Normal heart sounds. No murmur heard.   Pulmonary:     Effort: Pulmonary effort is normal. No respiratory distress.     Breath sounds: Normal breath sounds.  Abdominal:     General: Abdomen is protuberant.     Palpations: Abdomen is soft.     Tenderness: There is generalized  abdominal tenderness. There is no guarding or rebound.  Musculoskeletal:        General: Normal range of motion.     Cervical back: Neck supple.     Comments: He has an abrasion on his left elbow and his right distal middle phalanx is ecchymotic and tender.  He has edema of his lower extremities that are nontender.  He says he always has right greater than left due to his vein harvest from his CABG.  Skin:    General: Skin is warm and dry.     Capillary Refill: Capillary refill takes less than 2 seconds.  Neurological:     General: No focal deficit present.     Mental Status: He is alert.     ED Results / Procedures / Treatments   Labs (all labs ordered are listed, but only abnormal results are displayed) Labs Reviewed  CBC - Abnormal; Notable for the following components:      Result Value   WBC 16.7 (*)    MCH 34.3 (*)    All other components within normal limits  COMPREHENSIVE METABOLIC PANEL - Abnormal; Notable for the following components:   BUN 36 (*)    Creatinine, Ser 1.75 (*)    GFR, Estimated 41 (*)    All other components within normal limits  TROPONIN I (HIGH SENSITIVITY) - Abnormal; Notable for the following components:   Troponin I (High Sensitivity) 20 (*)     All other components within normal limits  TROPONIN I (HIGH SENSITIVITY) - Abnormal; Notable for the following components:   Troponin I (High Sensitivity) 20 (*)    All other components within normal limits  LIPASE, BLOOD    EKG EKG Interpretation  Date/Time:  Tuesday July 11 2020 15:06:39 EDT Ventricular Rate:  85 PR Interval:  169 QRS Duration: 104 QT Interval:  369 QTC Calculation: 426 R Axis:   -15 Text Interpretation: Sinus rhythm Atrial premature complexes Borderline left axis deviation Low voltage, precordial leads Consider anterior infarct Poor data quality in current ECG precludes serial comparison Confirmed by Meridee Score [phone]) on 07/11/2020 3:10:05 PM   Radiology CT ABDOMEN PELVIS WO CONTRAST  Result Date: 07/11/2020 CLINICAL DATA:  75 year old male with abdominal pain. EXAM: CT ABDOMEN AND PELVIS WITHOUT CONTRAST TECHNIQUE: Multidetector CT imaging of the abdomen and pelvis was performed following the standard protocol without IV contrast. COMPARISON:  CT abdomen pelvis dated 06/10/2017. FINDINGS: Evaluation of this exam is limited in the absence of intravenous contrast. Lower chest: The visualized lung bases are clear. There is coronary vascular calcification. No intra-abdominal free air or free fluid. Hepatobiliary: No focal liver abnormality is seen. No gallstones, gallbladder wall thickening, or biliary dilatation. Pancreas: Unremarkable. No pancreatic ductal dilatation or surrounding inflammatory changes. Spleen: Normal in size without focal abnormality. Adrenals/Urinary Tract: The adrenal glands unremarkable. Moderate bilateral renal parenchyma atrophy. There is no hydronephrosis or nephrolithiasis on either side. Multiple bilateral renal cysts measuring up to 8.7 cm in the upper pole of the right kidney. Evaluation of the cysts are very limited in the absence of contrast. Additional subcentimeter hypodensities are too small to characterize. The visualized ureters and  urinary bladder appear unremarkable. Stomach/Bowel: There is sigmoid diverticulosis and scattered colonic diverticula without active inflammatory changes. There is no bowel obstruction. The appendix is normal. Vascular/Lymphatic: Moderate aortoiliac atherosclerotic disease. The IVC is unremarkable. No portal venous gas. There is no adenopathy. Reproductive: The prostate and seminal vesicles are grossly unremarkable. No pelvic masses Other: None Musculoskeletal:  Osteopenia with multilevel degenerative changes of the spine. No acute osseous pathology. IMPRESSION: 1. No acute intra-abdominal or pelvic pathology. 2. Colonic diverticulosis. No bowel obstruction. Normal appendix. 3. Aortic Atherosclerosis (ICD10-I70.0). Electronically Signed   By: Elgie Collard M.D.   On: 07/11/2020 18:22   DG Chest 2 View  Result Date: 07/11/2020 CLINICAL DATA:  Chest pain after fall from steps. EXAM: CHEST - 2 VIEW COMPARISON:  Chest CT June 04, 2020 and chest radiograph May 14, 2018 FINDINGS: Prior median sternotomy and CABG. Cardiac silhouette is borderline enlarged, similar prior. No discrete mediastinal or hilar masses. No focal consolidation. No pleural effusion. No pneumothorax. Chronic parenchymal lung changes of COPD. Thoracic spondylosis. IMPRESSION: 1. No acute cardiopulmonary disease. 2. Chronic parenchymal lung changes of COPD. 3. Prior median sternotomy and CABG. Electronically Signed   By: Maudry Mayhew MD   On: 07/11/2020 16:16   DG Elbow Complete Right  Result Date: 07/11/2020 CLINICAL DATA:  Pain after fall EXAM: RIGHT ELBOW - COMPLETE 3+ VIEW COMPARISON:  None. FINDINGS: Calcific density/irregularity along the lateral humeral epicondyle. No evidence of dislocation, or joint effusion. Mild degenerative spurring. Soft tissue swelling about the elbow/forearm. IMPRESSION: 1. Calcific density/irregularity along the lateral humeral epicondyle may represent sequela of prior lateral epicondylitis or less  likely a possible avulsion fracture recommend correlation with point tenderness. Electronically Signed   By: Maudry Mayhew MD   On: 07/11/2020 16:20   DG Finger Middle Right  Result Date: 07/11/2020 CLINICAL DATA:  Pain after fall EXAM: RIGHT MIDDLE FINGER 2+V COMPARISON:  None. FINDINGS: There is no evidence of fracture or dislocation. Multifocal degenerative change most notably at the distal interphalangeal joint. Mineralization along the extensor tendons. Soft tissue swelling about the digit. IMPRESSION: 1. No fracture or dislocation. Soft tissue swelling about the digit. 2. Degenerative change with mineralization along the extensor tendons. Electronically Signed   By: Maudry Mayhew MD   On: 07/11/2020 16:22    Procedures Procedures   Medications Ordered in ED Medications  Tdap (BOOSTRIX) injection 0.5 mL (has no administration in time range)    ED Course  I have reviewed the triage vital signs and the nursing notes.  Pertinent labs & imaging results that were available during my care of the patient were reviewed by me and considered in my medical decision making (see chart for details).  Clinical Course as of 07/11/20 1904  Tue Jul 11, 2020  1626 Patient's care is signed out to oncoming provider Dr. Rodena Medin who will follow up on the patient's results and likely will need some sort of abdominal imaging. [MB]    Clinical Course User Index [MB] Terrilee Files, MD   MDM Rules/Calculators/A&P                         This patient complains of generalized abdominal pain, constipation diarrhea, chest pain, low blood pressure, right elbow and right middle finger pain after fall; this involves an extensive number of treatment Options and is a complaint that carries with it a high risk of complications and Morbidity. The differential includes ACS, vascular, PE, obstruction, constipation, colitis, diverticulitis, fracture, dislocation  I ordered, reviewed and interpreted labs, which  included CBC with elevated white count, stable hemoglobin, chemistries fairly normal other than elevated BUN and creatinine, normal LFTs, troponin elevated, will need to be trended I ordered imaging studies which included chest x-ray, right elbow and right middle finger x-ray and I independently  visualized and interpreted imaging which showed no acute findings Additional history obtained from EMS Previous records obtained and reviewed in epic including PCP visit from today  After the interventions stated above, I reevaluated the patient and found patient to be minimally symptomatic.  No chest pain.  His care is signed out to oncoming provider Dr. Rodena Medin to follow-up CT abdomen and pelvis.  Likely can be discharged if no acute findings   Final Clinical Impression(s) / ED Diagnoses Final diagnoses:  None    Rx / DC Orders ED Discharge Orders    None       Terrilee Files, MD 07/11/20 1907

## 2020-07-11 NOTE — Progress Notes (Signed)
   Subjective:    Patient ID: Chase Fort., male    DOB: [birth year], 75 y.o.   MRN: [id number]  HPI He came in today initially for evaluation of continued abdominal distress and most recently difficulty with diarrhea however in the past it was constipation.  He did fall within the last hour when he tripped over his shoe.  He was coming here to get evaluated for this.  When he arrived here he complained of chest pain, diaphoresis, weakness and shortness of breath.  He has a previous history of CABG x4 in 2001.  He is followed by Dr. [place].  Prior to this episode he has been doing pretty well from a cardiac position but unfortunately he has had a rough go lately.  His partner of 46 years died.  He is in the process of finding a new place to live.   Review of Systems     Objective:   Physical Exam Alert.  Diaphoretic.  Bp 80/60 Cardiac exam shows a regular rhythm. Right elbow shows an abrasion laterally. EKG shows a sinus rhythm of 74 with other parameters being normal.  Compared to previous EKG it is unchanged.     Assessment & Plan:

## 2020-07-11 NOTE — ED Provider Notes (Signed)
Patient seen after prior ED provider.    Patient will be admitted for further work-up and treatment.   Hospitalist service is aware of case and will evaluate for admission.    Wynetta Fines, MD 07/11/20 [phone]

## 2020-07-11 NOTE — Progress Notes (Signed)
BP 88/61.  Pt asymptomatic.  Did get BP meds tonight.  Will give 500cc bolus and hold BP meds for tomorrow.

## 2020-07-11 NOTE — H&P (Signed)
History and Physical    Chase Wright. ZOX:[id number] DOB: 16-Jan-1946 DOA: 07/11/2020  PCP: Ronnald Nian, MD  Patient coming from: Home  I have personally briefly reviewed patient's old medical records in [facility]  Chief Complaint: CP  HPI: Chase Wright. is a 75 y.o. male with medical history significant of CAD s/p CABG, GERD, HTN, HLD.  Pt with abd pain with constipation and diarrhea since Dec.  Was going to see PCP today for same when he tripped and had mechanical fall.  Abrasion to elbow and injury to R middle finger.  While in PCP office had some substernal CP and low BP prompting them to send him to ED via EMS.  No cough, no fever, no nausea, no vomiting.   ED Course: CP resolved in ED.  Now just has his more typical "GERD" symptoms (when asked about the GERD he says "I live on pepcid")  Trops 20 and 20.  Creat 1.75 (looks like he has CKD 3 at baseline).  CT abd/pelvis neg for acute finding.  EDP wants CP obs admit.   Review of Systems: As per HPI, otherwise all review of systems negative.  Past Medical History:  Diagnosis Date  . ASHD (arteriosclerotic heart disease)   . Chronic kidney disease    RENAL STONES  . Diverticulitis   . GERD (gastroesophageal reflux disease)   . Hx of colonic polyps   . Hypertension   . Obesity   . PUD (peptic ulcer disease)   . Renal insufficiency     Past Surgical History:  Procedure Laterality Date  . 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 CARDIAC CATHETERIZATION  08/2005  . NASAL ENDOSCOPY  2008  . VIDEO BRONCHOSCOPY Bilateral 05/01/2018   Procedure: VIDEO BRONCHOSCOPY WITHOUT FLUORO;  Surgeon: Luciano Cutter, MD;  Location: [facility];  Service: Cardiopulmonary;  Laterality: Bilateral;     reports that he has never smoked. He has never used smokeless tobacco. He reports current alcohol use. He reports that he does not use drugs.  Allergies  Allergen Reactions  . Statins Other (See Comments)     Family History  Problem Relation Age of Onset  . Diabetes Mother   . Prostate cancer Father 69  . Colon cancer Neg Hx      Prior to Admission medications   Medication Sig Start Date End Date Taking? Authorizing Provider  alfuzosin (UROXATRAL) 10 MG 24 hr tablet Take 1 tablet (10 mg total) by mouth daily. 01/27/20   Ronnald Nian, MD  Alirocumab (PRALUENT) 75 MG/ML SOAJ Inject 1 application into the skin every 14 (fourteen) days. Patient not taking: No sig reported 01/27/20   Ronnald Nian, MD  allopurinol (ZYLOPRIM) 300 MG tablet Take 1 tablet (300 mg total) by mouth daily. 01/27/20   Ronnald Nian, MD  amLODipine (NORVASC) 5 MG tablet Take 1 tablet (5 mg total) by mouth daily. 01/27/20   Ronnald Nian, MD  aspirin 325 MG tablet Take 325 mg by mouth daily.    [provider]  famotidine (PEPCID) 20 MG tablet Take 20 mg by mouth daily.     [provider]  hydrochlorothiazide (MICROZIDE) 12.5 MG capsule TAKE 1 CAPSULE BY MOUTH EVERY DAY 01/27/20   Ronnald Nian, MD  Icosapent Ethyl (VASCEPA) 1 g CAPS Take 2 capsules (2 g total) by mouth 2 (two) times daily. Patient not taking: No sig reported 01/21/19   Ronnald Nian, MD  metoprolol succinate (  TOPROL-XL) 50 MG 24 hr tablet TAKE 1 TABLET BY MOUTH EVERY DAY WITH OR IMMEDIATELY FOLLOWING A MEAL 01/27/20   Ronnald Nian, MD  ramipril (ALTACE) 10 MG capsule Take 1 capsule (10 mg total) by mouth daily. 01/27/20   Ronnald Nian, MD  Respiratory Therapy Supplies (FLUTTER) DEVI Use device 2-3 times a day to break up congestion Patient not taking: No sig reported 04/20/18   Luciano Cutter, MD    Physical Exam: Vitals:   07/11/20 1815 07/11/20 1830 07/11/20 1845 07/11/20 1900  BP: 115/79 113/65 110/61 121/72  Pulse: 83 72 81 70  Resp:    20  Temp:      TempSrc:      SpO2: 96% 94% 97% 97%  Weight:      Height:        Constitutional: NAD, calm, comfortable Eyes: PERRL, lids and conjunctivae  normal ENMT: Mucous membranes are moist. Posterior pharynx clear of any exudate or lesions.Normal dentition.  Neck: normal, supple, no masses, no thyromegaly Respiratory: clear to auscultation bilaterally, no wheezing, no crackles. Normal respiratory effort. No accessory muscle use.  Cardiovascular: Regular rate and rhythm, no murmurs / rubs / gallops. BLE edema present R>L but this latter is chronic since the vein harvest for CABG he relates. 2+ pedal pulses. No carotid bruits.  No chest wall TTP Abdomen: no tenderness, no masses palpated. No hepatosplenomegaly. Bowel sounds positive.  Musculoskeletal: no clubbing / cyanosis. No joint deformity upper and lower extremities. Good ROM, no contractures. Normal muscle tone.  Skin: no rashes, lesions, ulcers. No induration Neurologic: CN 2-12 grossly intact. Sensation intact, DTR normal. Strength 5/5 in all 4.  Psychiatric: Normal judgment and insight. Alert and oriented x 3. Normal mood.    Labs on Admission: I have personally reviewed following labs and imaging studies  CBC: Recent Labs  Lab 07/11/20 1512  WBC 16.7*  HGB 15.1  HCT 44.0  MCV 100.0  PLT 277   Basic Metabolic Panel: Recent Labs  Lab 07/11/20 1520  NA 135  K 4.5  CL 100  CO2 25  GLUCOSE 94  BUN 36*  CREATININE 1.75*  CALCIUM 9.4   GFR: Estimated Creatinine Clearance: 41 mL/min (A) (by C-G formula based on SCr of 1.75 mg/dL (H)). Liver Function Tests: Recent Labs  Lab 07/11/20 1520  AST 21  ALT 19  ALKPHOS 43  BILITOT 0.9  PROT 6.5  ALBUMIN 3.6   Recent Labs  Lab 07/11/20 1520  LIPASE 48   No results for input(s): AMMONIA in the last 168 hours. Coagulation Profile: No results for input(s): INR, PROTIME in the last 168 hours. Cardiac Enzymes: No results for input(s): CKTOTAL, CKMB, CKMBINDEX, TROPONINI in the last 168 hours. BNP (last 3 results) No results for input(s): PROBNP in the last 8760 hours. HbA1C: No results for input(s): HGBA1C in the  last 72 hours. CBG: No results for input(s): GLUCAP in the last 168 hours. Lipid Profile: No results for input(s): CHOL, HDL, LDLCALC, TRIG, CHOLHDL, LDLDIRECT in the last 72 hours. Thyroid Function Tests: No results for input(s): TSH, T4TOTAL, FREET4, T3FREE, THYROIDAB in the last 72 hours. Anemia Panel: No results for input(s): VITAMINB12, FOLATE, FERRITIN, TIBC, IRON, RETICCTPCT in the last 72 hours. Urine analysis:    Component Value Date/Time   COLORURINE YELLOW 02/25/2015 1048   APPEARANCEUR CLEAR 02/25/2015 1048   LABSPEC 1.018 02/25/2015 1048   PHURINE 6.0 02/25/2015 1048   GLUCOSEU NEGATIVE 02/25/2015 1048   HGBUR NEGATIVE 02/25/2015  1048   BILIRUBINUR NEGATIVE 02/25/2015 1048   KETONESUR NEGATIVE 02/25/2015 1048   PROTEINUR NEGATIVE 02/25/2015 1048   NITRITE NEGATIVE 02/25/2015 1048   LEUKOCYTESUR NEGATIVE 02/25/2015 1048    Radiological Exams on Admission: CT ABDOMEN PELVIS WO CONTRAST  Result Date: 07/11/2020 CLINICAL DATA:  75 year old male with abdominal pain. EXAM: CT ABDOMEN AND PELVIS WITHOUT CONTRAST TECHNIQUE: Multidetector CT imaging of the abdomen and pelvis was performed following the standard protocol without IV contrast. COMPARISON:  CT abdomen pelvis dated 06/10/2017. FINDINGS: Evaluation of this exam is limited in the absence of intravenous contrast. Lower chest: The visualized lung bases are clear. There is coronary vascular calcification. No intra-abdominal free air or free fluid. Hepatobiliary: No focal liver abnormality is seen. No gallstones, gallbladder wall thickening, or biliary dilatation. Pancreas: Unremarkable. No pancreatic ductal dilatation or surrounding inflammatory changes. Spleen: Normal in size without focal abnormality. Adrenals/Urinary Tract: The adrenal glands unremarkable. Moderate bilateral renal parenchyma atrophy. There is no hydronephrosis or nephrolithiasis on either side. Multiple bilateral renal cysts measuring up to 8.7 cm in the  upper pole of the right kidney. Evaluation of the cysts are very limited in the absence of contrast. Additional subcentimeter hypodensities are too small to characterize. The visualized ureters and urinary bladder appear unremarkable. Stomach/Bowel: There is sigmoid diverticulosis and scattered colonic diverticula without active inflammatory changes. There is no bowel obstruction. The appendix is normal. Vascular/Lymphatic: Moderate aortoiliac atherosclerotic disease. The IVC is unremarkable. No portal venous gas. There is no adenopathy. Reproductive: The prostate and seminal vesicles are grossly unremarkable. No pelvic masses Other: None Musculoskeletal: Osteopenia with multilevel degenerative changes of the spine. No acute osseous pathology. IMPRESSION: 1. No acute intra-abdominal or pelvic pathology. 2. Colonic diverticulosis. No bowel obstruction. Normal appendix. 3. Aortic Atherosclerosis (ICD10-I70.0). Electronically Signed   By: Elgie CollardArash  Radparvar M.D.   On: 07/11/2020 18:22   DG Chest 2 View  Result Date: 07/11/2020 CLINICAL DATA:  Chest pain after fall from steps. EXAM: CHEST - 2 VIEW COMPARISON:  Chest CT June 04, 2020 and chest radiograph May 14, 2018 FINDINGS: Prior median sternotomy and CABG. Cardiac silhouette is borderline enlarged, similar prior. No discrete mediastinal or hilar masses. No focal consolidation. No pleural effusion. No pneumothorax. Chronic parenchymal lung changes of COPD. Thoracic spondylosis. IMPRESSION: 1. No acute cardiopulmonary disease. 2. Chronic parenchymal lung changes of COPD. 3. Prior median sternotomy and CABG. Electronically Signed   By: Maudry MayhewJeffrey  Waltz MD   On: 07/11/2020 16:16   DG Elbow Complete Right  Result Date: 07/11/2020 CLINICAL DATA:  Pain after fall EXAM: RIGHT ELBOW - COMPLETE 3+ VIEW COMPARISON:  None. FINDINGS: Calcific density/irregularity along the lateral humeral epicondyle. No evidence of dislocation, or joint effusion. Mild degenerative  spurring. Soft tissue swelling about the elbow/forearm. IMPRESSION: 1. Calcific density/irregularity along the lateral humeral epicondyle may represent sequela of prior lateral epicondylitis or less likely a possible avulsion fracture recommend correlation with point tenderness. Electronically Signed   By: Maudry MayhewJeffrey  Waltz MD   On: 07/11/2020 16:20   DG Finger Middle Right  Result Date: 07/11/2020 CLINICAL DATA:  Pain after fall EXAM: RIGHT MIDDLE FINGER 2+V COMPARISON:  None. FINDINGS: There is no evidence of fracture or dislocation. Multifocal degenerative change most notably at the distal interphalangeal joint. Mineralization along the extensor tendons. Soft tissue swelling about the digit. IMPRESSION: 1. No fracture or dislocation. Soft tissue swelling about the digit. 2. Degenerative change with mineralization along the extensor tendons. Electronically Signed   By: Maudry MayhewJeffrey  Waltz MD  On: 07/11/2020 16:22    EKG: Independently reviewed.  Assessment/Plan Principal Problem:   Chest pain, rule out acute myocardial infarction Active Problems:   Hypertension   Gastroesophageal reflux disease without esophagitis   ASHD (arteriosclerotic heart disease)   Hx of CABG    1. CP r/o - 1. CP obs pathway 2. Serial trops 3. Tele monitor 4. NPO after MN 5. Cards eval in AM 2. GERD - 1. Adding Protonix 2. Cont PRN Pepcid 3. HTN - 1. Cont home BP meds 4. CAD s/p CABG - 1. Cont home meds 2. Allergy to statin 3. Looks like he was previously on praluent...    DVT prophylaxis: Lovenox Code Status: Full Family Communication: No family in room Disposition Plan: Home after cleared by cards Consults called: Message sent to P.Johna Sheriff for AM consult Admission status: Place in [place], [place] M. DO [facility]  How to contact the [facility] Attending or Consulting provider 7A - 7P or covering provider during after hours 7P -7A, for this patient?  1. Check the care team in [facility] and look for a)  attending/consulting [facility] provider listed and b) the [facility] team listed 2. Log into [email or web address]  Amion Physician Scheduling and messaging for groups and whole hospitals  On call and physician scheduling software for group practices, residents, hospitalists and other medical providers for call, clinic, rotation and shift schedules. On[employer] is a hospital-wide system for scheduling doctors and paging doctors on call. EasyPlot is for scientific plotting and data analysis.  [email or web address]  and use [place]'s universal password to access. If you do not have the password, please contact the hospital operator.  3. Locate the [facility] provider you are looking for under [facility] and page to a number that you can be directly reached. 4. If you still have difficulty reaching the provider, please page the [facility] (Director on Call) for the Hospitalists listed on amion for assistance.  07/11/2020, 7:49 PM

## 2020-07-11 NOTE — ED Notes (Addendum)
Paged Dr. Julian Reil d/t pt's bp 88/61. Pt resting in room. Waiting for response

## 2020-07-11 NOTE — ED Notes (Signed)
To patient into a gown on the monitor did ekg shown to Dr Charm Barges patient is resting with call bell in reach

## 2020-07-11 NOTE — ED Notes (Signed)
Patient transported to CT 

## 2020-07-11 NOTE — ED Triage Notes (Signed)
Pt at doctors office c/o chronic abdominal pain. At Drs Office had acute onset of chest pain, became diaphoretic and hypotensive 80/60. Given 81 ASA  EMS report 136/82 BP, complete resolution of chest pain

## 2020-07-12 ENCOUNTER — Observation Stay (HOSPITAL_BASED_OUTPATIENT_CLINIC_OR_DEPARTMENT_OTHER): Payer: Medicare Other

## 2020-07-12 ENCOUNTER — Telehealth: Payer: Self-pay | Admitting: Family Medicine

## 2020-07-12 DIAGNOSIS — Z951 Presence of aortocoronary bypass graft: Secondary | ICD-10-CM | POA: Diagnosis not present

## 2020-07-12 DIAGNOSIS — R079 Chest pain, unspecified: Secondary | ICD-10-CM

## 2020-07-12 DIAGNOSIS — I2510 Atherosclerotic heart disease of native coronary artery without angina pectoris: Secondary | ICD-10-CM | POA: Diagnosis not present

## 2020-07-12 LAB — BASIC METABOLIC PANEL
Anion gap: 7 (ref 5–15)
BUN: 35 mg/dL — ABNORMAL HIGH (ref 8–23)
CO2: 26 mmol/L (ref 22–32)
Calcium: 9.6 mg/dL (ref 8.9–10.3)
Chloride: 102 mmol/L (ref 98–111)
Creatinine, Ser: 1.52 mg/dL — ABNORMAL HIGH (ref 0.61–1.24)
GFR, Estimated: 48 mL/min — ABNORMAL LOW (ref >60)
Glucose, Bld: 97 mg/dL (ref 70–99)
Potassium: 4.4 mmol/L (ref 3.5–5.1)
Sodium: 135 mmol/L (ref 135–145)

## 2020-07-12 LAB — CBC
HCT: 43.8 % (ref 39.0–52.0)
Hemoglobin: 14.5 g/dL (ref 13.0–17.0)
MCH: 33 pg (ref 26.0–34.0)
MCHC: 33.1 g/dL (ref 30.0–36.0)
MCV: 99.5 fL (ref 80.0–100.0)
Platelets: 269 10*3/uL (ref 150–400)
RBC: 4.4 MIL/uL (ref 4.22–5.81)
RDW: 13.1 % (ref 11.5–15.5)
WBC: 9.5 10*3/uL (ref 4.0–10.5)
nRBC: 0 % (ref 0.0–0.2)

## 2020-07-12 LAB — ECHOCARDIOGRAM COMPLETE
AR max vel: 2.03 cm2
AV Area VTI: 2.47 cm2
AV Area mean vel: 2.06 cm2
AV Mean grad: 7.5 mmHg
AV Peak grad: 14.9 mmHg
Ao pk vel: 1.93 m/s
Area-P 1/2: 2.83 cm2
Height: 63 in
S' Lateral: 3.1 cm
Weight: 3680 oz

## 2020-07-12 LAB — C-REACTIVE PROTEIN: CRP: 4.4 mg/dL — ABNORMAL HIGH (ref <1.0)

## 2020-07-12 LAB — SARS CORONAVIRUS 2 (TAT 6-24 HRS): SARS Coronavirus 2: NEGATIVE

## 2020-07-12 MED ORDER — PERFLUTREN LIPID MICROSPHERE
1.0000 mL | INTRAVENOUS | Status: AC | PRN
  Administered 2020-07-12: 1.5 mL via INTRAVENOUS
  Filled 2020-07-12: qty 10

## 2020-07-12 MED ORDER — RISAQUAD PO CAPS
1.0000 | ORAL_CAPSULE | Freq: Every day | ORAL | Status: DC
  Administered 2020-07-12 – 2020-07-13 (×2): 1 via ORAL
  Filled 2020-07-12 (×3): qty 1

## 2020-07-12 NOTE — Telephone Encounter (Signed)
While pt was here in our office, we called 911 per instructions.  Pt was upset because his truck had broken down in our parking lot, his dogs would not be fed and he would lose the apartment he needed to sign for that afternoon.  I obtained the information from his regarding his Curator John at [employer] and call and told him pt truck was coming.  John was familiar with the truck. Called [employer] and had them tow the truck to [employer]. I took a photo of the hook up.  Valora Piccolo his friend was called and she came by and picked up pt house keys so she could feed the dogs and care for them.  I called Tracey at [facility] and advised her the patient would not be able to sign this afternoon, but did not want to lose the apartment and she assured me he would not lose the apartment as everything was done except his signature.  I texted pt to let him know all had been taken care of.

## 2020-07-12 NOTE — ED Notes (Signed)
Tried calling report to 6E, RN busy, will call back.

## 2020-07-12 NOTE — Consult Note (Addendum)
Cardiology Consultation:   Patient ID: Chase Wright. MRN: [id number]; DOB: December 22, 1945  Admit date: 07/11/2020 Date of Consult: 07/12/2020  PCP:  Ronnald Nian, MD   [facility]  Cardiologist:  New. Remotely by Dr. [place], has not seen cardiology since 2007 Advanced Practice Provider:  No care team member to display Electrophysiologist:  None    Patient Profile:   Chase Wright. is a 75 y.o. 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 Benjamine Mola.  History of Present Illness:   Mr. 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  Last cardiac catheterization was performed on 08/30/2005 that showed occluded proximal LAD, occluded intermediate branch, occluded proximal OM1, 70% proximal to distal RCA, sequential SVG to PDA and PLA branch of RCA was widely patent, patent SVG to intermediate branch, patent SVG to OM1, patent LIMA to LAD with 80 to 90% stenosis in distal LAD, EF 65%.  Medical therapy was recommended.  He has been lost to follow-up since.  More recently, he has been undergoing tremendous amount of stress as his partner 46 years died a month ago.  Since then, he has sold their house and that is currently trying to move into a new apartment.  He has been moving boxes without any exertional chest pain.  Since December he has been dealing with GI issues.  He felt significant abdominal distention.  Initially he had constipation, however since then, he started having diarrhea. He had several accidents before reaching the bathroom in time.  He went to see his PCP yesterday to discuss his GI issue.  On his way there, he tripped over his shoe when he fell and subsequently injured his finger and elbow.  While in the PCPs office, it was noted his blood pressure was low at 80/60 and  he started having a substernal chest pain for about 5 minutes before resolving.  Given his prior cardiac history and the low blood pressure, he was sent to the [facility] for further evaluation.  Overnight, serial troponin has been borderline however flat.  Chest pain has not recurred since.    Past Medical History:  Diagnosis Date  . ASHD (arteriosclerotic heart disease)   . Chronic kidney disease    RENAL STONES  . Diverticulitis   . GERD (gastroesophageal reflux disease)   . Hx of colonic polyps   . Hypertension   . Obesity   . PUD (peptic ulcer disease)   . Renal insufficiency     Past Surgical History:  Procedure Laterality Date  . 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 CARDIAC CATHETERIZATION  08/2005  . NASAL ENDOSCOPY  2008  . VIDEO BRONCHOSCOPY Bilateral 05/01/2018   Procedure: VIDEO BRONCHOSCOPY WITHOUT FLUORO;  Surgeon: Luciano Cutter, MD;  Location: [facility];  Service: Cardiopulmonary;  Laterality: Bilateral;     Home Medications:  Prior to Admission medications   Medication Sig Start Date End Date Taking? Authorizing Provider  alfuzosin (UROXATRAL) 10 MG 24 hr tablet Take 1 tablet (10 mg total) by mouth daily. Patient taking differently: Take 10 mg by mouth at bedtime. 01/27/20  Yes Ronnald Nian, MD  allopurinol (ZYLOPRIM) 300 MG tablet Take 1 tablet (300 mg total) by mouth daily. Patient taking differently: Take  300 mg by mouth at bedtime. 01/27/20  Yes Ronnald Nian, MD  amLODipine (NORVASC) 5 MG tablet Take 1 tablet (5 mg total) by mouth daily. Patient taking differently: Take 5 mg by mouth at bedtime. 01/27/20  Yes Ronnald Nian, MD  Artificial Tear Ointment (DRY EYES OP) Apply 1 drop to eye daily as needed (dry eyes).   Yes [provider]  aspirin 325 MG tablet Take 325 mg by mouth at bedtime.   Yes [provider]  famotidine (PEPCID) 20 MG tablet Take 20 mg by mouth daily.    Yes [provider]  hydrochlorothiazide  (MICROZIDE) 12.5 MG capsule TAKE 1 CAPSULE BY MOUTH EVERY DAY Patient taking differently: Take 12.5 mg by mouth at bedtime. 01/27/20  Yes Ronnald Nian, MD  ibuprofen (ADVIL) 200 MG tablet Take 400 mg by mouth every 6 (six) hours as needed for headache or mild pain.   Yes [provider]  metoprolol succinate (TOPROL-XL) 50 MG 24 hr tablet TAKE 1 TABLET BY MOUTH EVERY DAY WITH OR IMMEDIATELY FOLLOWING A MEAL Patient taking differently: Take 50 mg by mouth at bedtime. 01/27/20  Yes Ronnald Nian, MD  ramipril (ALTACE) 10 MG capsule Take 1 capsule (10 mg total) by mouth daily. Patient taking differently: Take 10 mg by mouth at bedtime. 01/27/20  Yes Ronnald Nian, MD    Inpatient Medications: Scheduled Meds: . acidophilus  1 capsule Oral Daily  . alfuzosin  10 mg Oral QHS  . allopurinol  300 mg Oral QHS  . aspirin  325 mg Oral QHS  . enoxaparin (LOVENOX) injection  40 mg Subcutaneous Q24H  . famotidine  20 mg Oral Daily  . pantoprazole  40 mg Oral Daily   Continuous Infusions:  PRN Meds: acetaminophen, ondansetron (ZOFRAN) IV  Allergies:    Allergies  Allergen Reactions  . Icosapent Ethyl Other (See Comments)    Muscle ache  . Praluent [Alirocumab] Other (See Comments)    Muscle pain, feel very sick  . Statins Other (See Comments)    Muscle pain    Social History:   Social History   Socioeconomic History  . Marital status: Single    Spouse name: Not on file  . Number of children: Not on file  . Years of education: Not on file  . Highest education level: Not on file  Occupational History  . Not on file  Tobacco Use  . Smoking status: Never Smoker  . Smokeless tobacco: Never Used  Vaping Use  . Vaping Use: Never used  Substance and Sexual Activity  . Alcohol use: Yes    Comment: rare beer  . Drug use: No  . Sexual activity: Not Currently  Other Topics Concern  . Not on file  Social History Narrative  . Not on file   Social Determinants of Health    Financial Resource Strain: Not on file  Food Insecurity: Not on file  Transportation Needs: Not on file  Physical Activity: Not on file  Stress: Not on file  Social Connections: Not on file  Intimate Partner Violence: Not on file    Family History:    Family History  Problem Relation Age of Onset  . Diabetes Mother   . Prostate cancer Father 29  . Colon cancer Neg Hx      ROS:  Please see the history of present illness.   All other ROS reviewed and negative.     Physical Exam/Data:   Vitals:  07/12/20 0445 07/12/20 0500 07/12/20 0646 07/12/20 1029  BP:  99/67 103/67 117/70  Pulse: 63 63 61 66  Resp: Temp:    98.8 F (37.1 C)  TempSrc:    Oral  SpO2: 97% 97% 97% 97%  Weight:      Height:       No intake or output data in the 24 hours ending 07/12/20 1328 Last 3 Weights 07/11/2020 07/11/2020 03/20/2020  Weight (lbs) 230 lb 239 lb 246 lb 6.4 oz  Weight (kg) 104.327 kg 108.41 kg 111.766 kg     Body mass index is 40.74 kg/m.  General:  Well nourished, well developed, in no acute distress HEENT: normal Lymph: no adenopathy Neck: no JVD Endocrine:  No thryomegaly Vascular: No carotid bruits; FA pulses 2+ bilaterally without bruits  Cardiac:  normal S1, S2; RRR; no murmur  Lungs:  clear to auscultation bilaterally, no wheezing, rhonchi or rales  Abd: soft, nontender, no hepatomegaly  Ext: no edema Musculoskeletal:  No deformities, BUE and BLE strength normal and equal Skin: warm and dry  Neuro:  CNs 2-12 intact, no focal abnormalities noted Psych:  Normal affect   EKG:  The EKG was personally reviewed and demonstrates:  NSR with underlying artifact, PACs, no significant ST-T wave changes. EKG from the PCP's office shows new TWI in lead I and aVL Telemetry:  Telemetry was personally reviewed and demonstrates:  NSR without significant ventricular ectopy  Relevant CV Studies:  Cath 08/30/2005 ANGIOGRAPHIC DATA:  The left coronary arises and  distributes normally.  The  left main coronary artery is without significant disease.   The left anterior descending artery is occluded proximally.   The intermediate branch is occluded proximally.   The left circumflex gives rise to a single marginal branch which is occluded  proximally.   The right coronary artery arises and distributes normally.  It is a dominant  vessel.  Has a 70% stenosis in the proximal vessel, is diffusely diseased up  to 70% in the distal vessel.  Posterolateral branch is occluded.   The saphenous vein graft sequentially to the PDA and posterolateral branches  of the right coronary artery is widely patent with good runoff.   The saphenous vein graft to the intermediate branch is widely patent with  good runoff.   The saphenous vein graft to the first obtuse marginal vessel is widely  patent with good runoff.   The LIMA graft to the LAD is a large graft and widely patent with good  runoff.  There is an 80-90% stenosis in the distal LAD.   Left ventricular angiography performed in the RAO view demonstrates normal  left ventricular size and contractility with normal systolic function.  Ejection fraction is estimated at 65%.  There is no mitral regurgitation or  prolapse.   FINAL INTERPRETATION:  1.  Severe three-vessel obstructive atherosclerotic coronary artery disease.  2.  All grafts are still widely patent including left internal mammary      artery graft to the left anterior descending, saphenous vein graft to      the intermediate, saphenous vein graft to the obtuse marginal branch,      saphenous vein graft sequentially to the posterior descending artery and      posterolateral branches of the right coronary artery.  3.  Normal left ventricular function.   PLAN:  Patient does have potential areas of ischemia in the distal LAD  territory.  I would recommend medical therapy.  Laboratory Data:  High Sensitivity Troponin:   Recent  Labs  Lab 07/11/20 1520 07/11/20 1712 07/11/20 1930 07/11/20 2130  TROPONINIHS 20* 20* 18* 18*     Chemistry Recent Labs  Lab 07/11/20 1520 07/12/20 1045  NA 135 135  K 4.5 4.4  CL 100 102  CO2 25 26  GLUCOSE 94 97  BUN 36* 35*  CREATININE 1.75* 1.52*  CALCIUM 9.4 9.6  GFRNONAA 41* 48*  ANIONGAP 10 7    Recent Labs  Lab 07/11/20 1520  PROT 6.5  ALBUMIN 3.6  AST 21  ALT 19  ALKPHOS 43  BILITOT 0.9   Hematology Recent Labs  Lab 07/11/20 1512 07/12/20 1045  WBC 16.7* 9.5  RBC 4.40 4.40  HGB 15.1 14.5  HCT 44.0 43.8  MCV 100.0 99.5  MCH 34.3* 33.0  MCHC 34.3 33.1  RDW 13.1 13.1  PLT 277 269   BNPNo results for input(s): BNP, PROBNP in the last 168 hours.  DDimer No results for input(s): DDIMER in the last 168 hours.   Radiology/Studies:  CT ABDOMEN PELVIS WO CONTRAST  Result Date: 07/11/2020 CLINICAL DATA:  75 year old male with abdominal pain. EXAM: CT ABDOMEN AND PELVIS WITHOUT CONTRAST TECHNIQUE: Multidetector CT imaging of the abdomen and pelvis was performed following the standard protocol without IV contrast. COMPARISON:  CT abdomen pelvis dated 06/10/2017. FINDINGS: Evaluation of this exam is limited in the absence of intravenous contrast. Lower chest: The visualized lung bases are clear. There is coronary vascular calcification. No intra-abdominal free air or free fluid. Hepatobiliary: No focal liver abnormality is seen. No gallstones, gallbladder wall thickening, or biliary dilatation. Pancreas: Unremarkable. No pancreatic ductal dilatation or surrounding inflammatory changes. Spleen: Normal in size without focal abnormality. Adrenals/Urinary Tract: The adrenal glands unremarkable. Moderate bilateral renal parenchyma atrophy. There is no hydronephrosis or nephrolithiasis on either side. Multiple bilateral renal cysts measuring up to 8.7 cm in the upper pole of the right kidney. Evaluation of the cysts are very limited in the absence of contrast. Additional  subcentimeter hypodensities are too small to characterize. The visualized ureters and urinary bladder appear unremarkable. Stomach/Bowel: There is sigmoid diverticulosis and scattered colonic diverticula without active inflammatory changes. There is no bowel obstruction. The appendix is normal. Vascular/Lymphatic: Moderate aortoiliac atherosclerotic disease. The IVC is unremarkable. No portal venous gas. There is no adenopathy. Reproductive: The prostate and seminal vesicles are grossly unremarkable. No pelvic masses Other: None Musculoskeletal: Osteopenia with multilevel degenerative changes of the spine. No acute osseous pathology. IMPRESSION: 1. No acute intra-abdominal or pelvic pathology. 2. Colonic diverticulosis. No bowel obstruction. Normal appendix. 3. Aortic Atherosclerosis (ICD10-I70.0). Electronically Signed   By: Elgie Collard M.D.   On: 07/11/2020 18:22   DG Chest 2 View  Result Date: 07/11/2020 CLINICAL DATA:  Chest pain after fall from steps. EXAM: CHEST - 2 VIEW COMPARISON:  Chest CT June 04, 2020 and chest radiograph May 14, 2018 FINDINGS: Prior median sternotomy and CABG. Cardiac silhouette is borderline enlarged, similar prior. No discrete mediastinal or hilar masses. No focal consolidation. No pleural effusion. No pneumothorax. Chronic parenchymal lung changes of COPD. Thoracic spondylosis. IMPRESSION: 1. No acute cardiopulmonary disease. 2. Chronic parenchymal lung changes of COPD. 3. Prior median sternotomy and CABG. Electronically Signed   By: Maudry Mayhew MD   On: 07/11/2020 16:16   DG Elbow Complete Right  Result Date: 07/11/2020 CLINICAL DATA:  Pain after fall EXAM: RIGHT ELBOW - COMPLETE 3+ VIEW COMPARISON:  None. FINDINGS: Calcific density/irregularity along the lateral  humeral epicondyle. No evidence of dislocation, or joint effusion. Mild degenerative spurring. Soft tissue swelling about the elbow/forearm. IMPRESSION: 1. Calcific density/irregularity along the  lateral humeral epicondyle may represent sequela of prior lateral epicondylitis or less likely a possible avulsion fracture recommend correlation with point tenderness. Electronically Signed   By: Maudry MayhewJeffrey  Waltz MD   On: 07/11/2020 16:20   DG Finger Middle Right  Result Date: 07/11/2020 CLINICAL DATA:  Pain after fall EXAM: RIGHT MIDDLE FINGER 2+V COMPARISON:  None. FINDINGS: There is no evidence of fracture or dislocation. Multifocal degenerative change most notably at the distal interphalangeal joint. Mineralization along the extensor tendons. Soft tissue swelling about the digit. IMPRESSION: 1. No fracture or dislocation. Soft tissue swelling about the digit. 2. Degenerative change with mineralization along the extensor tendons. Electronically Signed   By: Maudry MayhewJeffrey  Waltz MD   On: 07/11/2020 16:22     Assessment and Plan:   1. Chest pain  - onset of the chest pain was atypical.  Total duration only last about 5 minutes.  Prior to that, he has been moving boxes out of his old home into the new apartment without exertional symptoms.  Serial troponin relatively flat.  -Discussed with MD, we will obtain echocardiogram, if ejection fraction is normal and that he does not have any further symptoms, may consider outpatient Myoview.  Of note, patient is currently grieving due to the passing of his partner. MD to review EKG from PCP's office which shows slightly TWI in lead I and aVL  2. Hypotension: Blood pressure 80/60 at the PCPs office.  All four of his blood pressure medications are currently on hold.  We will slowly titrate blood pressure medication as outpatient.  3. CAD s/p CABG: on high dose aspirin  4. HTN: See #1.  5. HLD: Intolerant of statins.  He was approved for Praluent through his PCP in Oct 2021.  6. GERD  7. Grief: his partner of 46 years passed away a month ago.  8. Leukocytosis: resolved   Risk Assessment/Risk Scores:     HEAR Score (for undifferentiated chest pain):  HEAR  Score: 4          For questions or updates, please contact [facility] Please consult [email or web address] for contact info under    Ramond DialSigned, Hao Meng, GeorgiaPA  07/12/2020 1:28 PM   Attending Note:   The patient was seen and examined.  Agree with assessment and plan as noted above.  Changes made to the above note as needed.  Patient seen and independently examined with  Azalee CourseHao Meng, PA .   We discussed all aspects of the encounter. I agree with the assessment and plan as stated above.  1.  Chest pain:   Unclear etiology .  He has known CAD with a moderate distal LAD stenosis  aprox 15 year ago  He has ? New TWI in lateral leads. I think he would benefit from a myoview study  Echo today .   2. HTN:   BP has been low.   I would hold his amlodipine , continue his other meds. He said that he took his BP meds last night despite the fact that they are not on his med list    I have spent a total of 40 minutes with patient reviewing hospital  notes , telemetry, EKGs, labs and examining patient as well as establishing an assessment and plan that was discussed with the patient. > 50% of time was spent in direct patient care.  Vesta Mixer, Montez Hageman., MD, [facility] 07/12/2020, 1:51 PM [address],  [address] Office [phone] Pager [phone]

## 2020-07-12 NOTE — ED Notes (Signed)
Report called. Will transport when room ready.

## 2020-07-12 NOTE — Progress Notes (Signed)
Progress Note    Chase Fort.  XJD:[id number] DOB: 04/12/1945  DOA: 07/11/2020 PCP: Ronnald Nian, MD    Brief Narrative:    Medical records reviewed and are as summarized below:  Chase Fort. is an 75 y.o. male with medical history significant of CAD s/p CABG, GERD, HTN, HLD. Pt with diarrhea since Dec.  Was going to see PCP today for same when he tripped and had mechanical fall.  Abrasion to elbow and injury to R middle finger.  While in PCP office had some substernal CP and low BP prompting them to send him to ED via EMS.  Assessment/Plan:   Principal Problem:   Chest pain, rule out acute myocardial infarction Active Problems:   Hypertension   Gastroesophageal reflux disease without esophagitis   ASHD (arteriosclerotic heart disease)   Hx of CABG   CP r/o - -CE flat -cards consult appreciated  Diarrhea -check stool studies -no recent abx use -add lactobacillus  GERD - - Protonix  HTN - -hold home BP meds   obesity Body mass index is 40.74 kg/m.   Family Communication/Anticipated D/C date and plan/Code Status   DVT prophylaxis: Lovenox ordered. Code Status: Full Code.  Disposition Plan: Status is: Observation  The patient will require care spanning > 2 midnights and should be moved to inpatient because: Inpatient level of care appropriate due to severity of illness  Dispo: The patient is from: Home              Anticipated d/c is to: Home              Patient currently is not medically stable to d/c.   Difficult to place patient No         Medical Consultants:   cards    Subjective:   Has had diarrhea since Decembet   Objective:    Vitals:   07/12/20 0445 07/12/20 0500 07/12/20 0646 07/12/20 1029  BP:  99/67 103/67 117/70  Pulse: 63 63 61 66  Resp: 10 12 18 18   Temp:    98.8 F (37.1 C)  TempSrc:    Oral  SpO2: 97% 97% 97% 97%  Weight:      Height:       No intake or output data in the 24 hours ending  07/12/20 1357 Filed Weights   07/11/20 1510  Weight: 104.3 kg    Exam:  General: Appearance:    Severely obese male in no acute distress     Lungs:    respirations unlabored  Heart:    Normal heart rate. Normal rhythm. No murmurs, rubs, or gallops.   MS:   All extremities are intact.   Neurologic:   Awake, alert, oriented x 3. No apparent focal neurological           defect.     Data Reviewed:   I have personally reviewed following labs and imaging studies:  Labs: Labs show the following:   Basic Metabolic Panel: Recent Labs  Lab 07/11/20 1520 07/12/20 1045  NA 135 135  K 4.5 4.4  CL 100 102  CO2 25 26  GLUCOSE 94 97  BUN 36* 35*  CREATININE 1.75* 1.52*  CALCIUM 9.4 9.6   GFR Estimated Creatinine Clearance: 47.2 mL/min (A) (by C-G formula based on SCr of 1.52 mg/dL (H)). Liver Function Tests: Recent Labs  Lab 07/11/20 1520  AST 21  ALT 19  ALKPHOS 43  BILITOT 0.9  PROT  6.5  ALBUMIN 3.6   Recent Labs  Lab 07/11/20 1520  LIPASE 48   No results for input(s): AMMONIA in the last 168 hours. Coagulation profile No results for input(s): INR, PROTIME in the last 168 hours.  CBC: Recent Labs  Lab 07/11/20 1512 07/12/20 1045  WBC 16.7* 9.5  HGB 15.1 14.5  HCT 44.0 43.8  MCV 100.0 99.5  PLT 277 269   Cardiac Enzymes: No results for input(s): CKTOTAL, CKMB, CKMBINDEX, TROPONINI in the last 168 hours. BNP (last 3 results) No results for input(s): PROBNP in the last 8760 hours. CBG: No results for input(s): GLUCAP in the last 168 hours. D-Dimer: No results for input(s): DDIMER in the last 72 hours. Hgb A1c: No results for input(s): HGBA1C in the last 72 hours. Lipid Profile: No results for input(s): CHOL, HDL, LDLCALC, TRIG, CHOLHDL, LDLDIRECT in the last 72 hours. Thyroid function studies: No results for input(s): TSH, T4TOTAL, T3FREE, THYROIDAB in the last 72 hours.  Invalid input(s): FREET3 Anemia work up: No results for input(s):  VITAMINB12, FOLATE, FERRITIN, TIBC, IRON, RETICCTPCT in the last 72 hours. Sepsis Labs: Recent Labs  Lab 07/11/20 1512 07/12/20 1045  WBC 16.7* 9.5    Microbiology Recent Results (from the past 240 hour(s))  SARS CORONAVIRUS 2 (TAT 6-24 HRS) Nasopharyngeal Nasopharyngeal Swab     Status: None   Collection Time: 07/11/20  8:46 PM   Specimen: Nasopharyngeal Swab  Result Value Ref Range Status   SARS Coronavirus 2 NEGATIVE NEGATIVE Final    Comment: (NOTE) SARS-CoV-2 target nucleic acids are NOT DETECTED.  The SARS-CoV-2 RNA is generally detectable in upper and lower respiratory specimens during the acute phase of infection. Negative results do not preclude SARS-CoV-2 infection, do not rule out co-infections with other pathogens, and should not be used as the sole basis for treatment or other patient management decisions. Negative results must be combined with clinical observations, patient history, and epidemiological information. The expected result is Negative.  Fact Sheet for Patients: HairSlick.no  Fact Sheet for Healthcare Providers: quierodirigir.com  This test is not yet approved or cleared by the [place] FDA and  has been authorized for detection and/or diagnosis of SARS-CoV-2 by FDA under an Emergency Use Authorization (EUA). This EUA will remain  in effect (meaning this test can be used) for the duration of the COVID-19 declaration under Se ction 564(b)(1) of the Act, 21 U.S.C. section 360bbb-3(b)(1), unless the authorization is terminated or revoked sooner.  Performed at [facility], [address] [place], [place] [address]     Procedures and diagnostic studies:  CT ABDOMEN PELVIS WO CONTRAST  Result Date: 07/11/2020 CLINICAL DATA:  75 year old male with abdominal pain. EXAM: CT ABDOMEN AND PELVIS WITHOUT CONTRAST TECHNIQUE: Multidetector CT imaging of the abdomen and pelvis was performed  following the standard protocol without IV contrast. COMPARISON:  CT abdomen pelvis dated 06/10/2017. FINDINGS: Evaluation of this exam is limited in the absence of intravenous contrast. Lower chest: The visualized lung bases are clear. There is coronary vascular calcification. No intra-abdominal free air or free fluid. Hepatobiliary: No focal liver abnormality is seen. No gallstones, gallbladder wall thickening, or biliary dilatation. Pancreas: Unremarkable. No pancreatic ductal dilatation or surrounding inflammatory changes. Spleen: Normal in size without focal abnormality. Adrenals/Urinary Tract: The adrenal glands unremarkable. Moderate bilateral renal parenchyma atrophy. There is no hydronephrosis or nephrolithiasis on either side. Multiple bilateral renal cysts measuring up to 8.7 cm in the upper pole of the right kidney. Evaluation  of the cysts are very limited in the absence of contrast. Additional subcentimeter hypodensities are too small to characterize. The visualized ureters and urinary bladder appear unremarkable. Stomach/Bowel: There is sigmoid diverticulosis and scattered colonic diverticula without active inflammatory changes. There is no bowel obstruction. The appendix is normal. Vascular/Lymphatic: Moderate aortoiliac atherosclerotic disease. The IVC is unremarkable. No portal venous gas. There is no adenopathy. Reproductive: The prostate and seminal vesicles are grossly unremarkable. No pelvic masses Other: None Musculoskeletal: Osteopenia with multilevel degenerative changes of the spine. No acute osseous pathology. IMPRESSION: 1. No acute intra-abdominal or pelvic pathology. 2. Colonic diverticulosis. No bowel obstruction. Normal appendix. 3. Aortic Atherosclerosis (ICD10-I70.0). Electronically Signed   By: Elgie Collard M.D.   On: 07/11/2020 18:22   DG Chest 2 View  Result Date: 07/11/2020 CLINICAL DATA:  Chest pain after fall from steps. EXAM: CHEST - 2 VIEW COMPARISON:  Chest CT  June 04, 2020 and chest radiograph May 14, 2018 FINDINGS: Prior median sternotomy and CABG. Cardiac silhouette is borderline enlarged, similar prior. No discrete mediastinal or hilar masses. No focal consolidation. No pleural effusion. No pneumothorax. Chronic parenchymal lung changes of COPD. Thoracic spondylosis. IMPRESSION: 1. No acute cardiopulmonary disease. 2. Chronic parenchymal lung changes of COPD. 3. Prior median sternotomy and CABG. Electronically Signed   By: Maudry Mayhew MD   On: 07/11/2020 16:16   DG Elbow Complete Right  Result Date: 07/11/2020 CLINICAL DATA:  Pain after fall EXAM: RIGHT ELBOW - COMPLETE 3+ VIEW COMPARISON:  None. FINDINGS: Calcific density/irregularity along the lateral humeral epicondyle. No evidence of dislocation, or joint effusion. Mild degenerative spurring. Soft tissue swelling about the elbow/forearm. IMPRESSION: 1. Calcific density/irregularity along the lateral humeral epicondyle may represent sequela of prior lateral epicondylitis or less likely a possible avulsion fracture recommend correlation with point tenderness. Electronically Signed   By: Maudry Mayhew MD   On: 07/11/2020 16:20   DG Finger Middle Right  Result Date: 07/11/2020 CLINICAL DATA:  Pain after fall EXAM: RIGHT MIDDLE FINGER 2+V COMPARISON:  None. FINDINGS: There is no evidence of fracture or dislocation. Multifocal degenerative change most notably at the distal interphalangeal joint. Mineralization along the extensor tendons. Soft tissue swelling about the digit. IMPRESSION: 1. No fracture or dislocation. Soft tissue swelling about the digit. 2. Degenerative change with mineralization along the extensor tendons. Electronically Signed   By: Maudry Mayhew MD   On: 07/11/2020 16:22    Medications:   . acidophilus  1 capsule Oral Daily  . alfuzosin  10 mg Oral QHS  . allopurinol  300 mg Oral QHS  . aspirin  325 mg Oral QHS  . enoxaparin (LOVENOX) injection  40 mg Subcutaneous Q24H  .  famotidine  20 mg Oral Daily  . pantoprazole  40 mg Oral Daily   Continuous Infusions:   LOS: 0 days   Joseph Art  [facility]   How to contact the [facility] Attending or Consulting provider 7A - 7P or covering provider during after hours 7P -7A, for this patient?  1. Check the care team in [facility] and look for a) attending/consulting [facility] provider listed and b) the [facility] team listed 2. Log into [email or web address] and use [place]'s universal password to access. If you do not have the password, please contact the hospital operator. 3. Locate the [facility] provider you are looking for under [facility] and page to a number that you can be directly reached. 4. If you still have difficulty reaching the provider, please page  the [facility] LPDOC (Director on Call) for the Hospitalists listed on amion for assistance.  07/12/2020, 1:57 PM

## 2020-07-13 ENCOUNTER — Observation Stay (HOSPITAL_BASED_OUTPATIENT_CLINIC_OR_DEPARTMENT_OTHER): Payer: Medicare Other

## 2020-07-13 DIAGNOSIS — R079 Chest pain, unspecified: Secondary | ICD-10-CM | POA: Diagnosis not present

## 2020-07-13 DIAGNOSIS — Z951 Presence of aortocoronary bypass graft: Secondary | ICD-10-CM | POA: Diagnosis not present

## 2020-07-13 DIAGNOSIS — I2510 Atherosclerotic heart disease of native coronary artery without angina pectoris: Secondary | ICD-10-CM | POA: Diagnosis not present

## 2020-07-13 LAB — NM MYOCAR MULTI W/SPECT W/WALL MOTION / EF
Estimated workload: 1 METS
Exercise duration (min): 5 min
Exercise duration (sec): 15 s
Peak HR: 81 {beats}/min
Rest HR: 71 {beats}/min
TID: 0.96

## 2020-07-13 LAB — BASIC METABOLIC PANEL
Anion gap: 7 (ref 5–15)
BUN: 39 mg/dL — ABNORMAL HIGH (ref 8–23)
CO2: 25 mmol/L (ref 22–32)
Calcium: 9 mg/dL (ref 8.9–10.3)
Chloride: 101 mmol/L (ref 98–111)
Creatinine, Ser: 1.57 mg/dL — ABNORMAL HIGH (ref 0.61–1.24)
GFR, Estimated: 47 mL/min — ABNORMAL LOW (ref >60)
Glucose, Bld: 98 mg/dL (ref 70–99)
Potassium: 4.1 mmol/L (ref 3.5–5.1)
Sodium: 133 mmol/L — ABNORMAL LOW (ref 135–145)

## 2020-07-13 LAB — TISSUE TRANSGLUTAMINASE, IGA: Tissue Transglutaminase Ab, IgA: <2 U/mL (ref 0–3)

## 2020-07-13 MED ORDER — LIDOCAINE 5 % EX PTCH
1.0000 | MEDICATED_PATCH | CUTANEOUS | Status: DC
  Administered 2020-07-13: 1 via TRANSDERMAL
  Filled 2020-07-13: qty 1

## 2020-07-13 MED ORDER — RISAQUAD PO CAPS: 1.0000 | ORAL_CAPSULE | Freq: Every day | ORAL | Status: AC

## 2020-07-13 MED ORDER — REGADENOSON 0.4 MG/5ML IV SOLN
0.4000 mg | Freq: Once | INTRAVENOUS | Status: AC
  Administered 2020-07-13: 0.4 mg via INTRAVENOUS
  Filled 2020-07-13: qty 5

## 2020-07-13 MED ORDER — TECHNETIUM TC 99M TETROFOSMIN IV KIT
31.0000 | PACK | Freq: Once | INTRAVENOUS | Status: AC | PRN
  Administered 2020-07-13: 31 via INTRAVENOUS

## 2020-07-13 MED ORDER — REGADENOSON 0.4 MG/5ML IV SOLN
INTRAVENOUS | Status: AC
  Filled 2020-07-13: qty 5

## 2020-07-13 MED ORDER — TECHNETIUM TC 99M TETROFOSMIN IV KIT
10.0000 | PACK | Freq: Once | INTRAVENOUS | Status: AC | PRN
  Administered 2020-07-13: 10 via INTRAVENOUS

## 2020-07-13 NOTE — Progress Notes (Signed)
Chase Wright. presented for a nuclear stress test today.  No immediate complications.  Stress imaging is pending at this time.   Preliminary EKG findings may be listed in the chart, but the stress test result will not be finalized until perfusion imaging is complete.   [place], [place]  07/13/2020 12:04 PM

## 2020-07-13 NOTE — Plan of Care (Signed)

## 2020-07-13 NOTE — Discharge Summary (Signed)
Physician Discharge Summary  Chase FortJames I Duff Jr. ZOX:[id number] DOB: 01-Oct-1945 DOA: 07/11/2020  PCP: [name], [name], MD  Admit date: 07/11/2020 Discharge date: 07/13/2020  Admitted From: home Discharge disposition: home   Recommendations for Outpatient Follow-Up:   1. Consider referral to GI for on-going gastric issues 2. If continues to have pain in right elbow, consider ortho consult 3. Held BP meds as BP normal off (did resume ACE)   Discharge Diagnosis:   Principal Problem:   Chest pain, rule out acute myocardial infarction Active Problems:   Hypertension   Gastroesophageal reflux disease without esophagitis   ASHD (arteriosclerotic heart disease)   Hx of CABG    Discharge Condition: Improved.  Diet recommendation: Low sodium, heart healthy.   Wound care: None.  Code status: Full.   History of Present Illness:   Chase FortJames I Krichbaum Jr. is a 75 y.o. male with medical history significant of CAD s/p CABG, GERD, HTN, HLD.  Pt with abd pain with constipation and diarrhea since Dec.  Was going to see PCP today for same when he tripped and had mechanical fall.  Abrasion to elbow and injury to R middle finger.  While in PCP office had some substernal CP and low BP prompting them to send him to ED via EMS.  No cough, no fever, no nausea, no vomiting.   Hospital Course by Problem:   CP r/o - -CE flat -cards consult appreciated -echo/stress test wnl  Diarrhea -no further epidodes while in hospital -no recent abx use -add lactobacillus  GERD - - Protonix  HTN - -hold home BP meds as BP controlled  obesity Body mass index is 40.74 kg/m.    Medical Consultants:   cards   Discharge Exam:   Vitals:   07/13/20 1158 07/13/20 1202  BP: 121/68 127/71  Pulse:    Resp:    Temp:    SpO2:     Vitals:   07/13/20 0409 07/13/20 1133 07/13/20 1158 07/13/20 1202  BP: 122/66 (!) 140/96 121/68 127/71  Pulse: 65     Resp: 16     Temp: 98.3  F (36.8 C)     TempSrc: Oral     SpO2: 96%     Weight: 105.5 kg     Height:        General exam: Appears calm and comfortable.     The results of significant diagnostics from this hospitalization (including imaging, microbiology, ancillary and laboratory) are listed below for reference.     Procedures and Diagnostic Studies:   CT ABDOMEN PELVIS WO CONTRAST  Result Date: 07/11/2020 CLINICAL DATA:  75 year old male with abdominal pain. EXAM: CT ABDOMEN AND PELVIS WITHOUT CONTRAST TECHNIQUE: Multidetector CT imaging of the abdomen and pelvis was performed following the standard protocol without IV contrast. COMPARISON:  CT abdomen pelvis dated 06/10/2017. FINDINGS: Evaluation of this exam is limited in the absence of intravenous contrast. Lower chest: The visualized lung bases are clear. There is coronary vascular calcification. No intra-abdominal free air or free fluid. Hepatobiliary: No focal liver abnormality is seen. No gallstones, gallbladder wall thickening, or biliary dilatation. Pancreas: Unremarkable. No pancreatic ductal dilatation or surrounding inflammatory changes. Spleen: Normal in size without focal abnormality. Adrenals/Urinary Tract: The adrenal glands unremarkable. Moderate bilateral renal parenchyma atrophy. There is no hydronephrosis or nephrolithiasis on either side. Multiple bilateral renal cysts measuring up to 8.7 cm in the upper pole of the right kidney. Evaluation of the cysts are very limited in the  absence of contrast. Additional subcentimeter hypodensities are too small to characterize. The visualized ureters and urinary bladder appear unremarkable. Stomach/Bowel: There is sigmoid diverticulosis and scattered colonic diverticula without active inflammatory changes. There is no bowel obstruction. The appendix is normal. Vascular/Lymphatic: Moderate aortoiliac atherosclerotic disease. The IVC is unremarkable. No portal venous gas. There is no adenopathy. Reproductive: The  prostate and seminal vesicles are grossly unremarkable. No pelvic masses Other: None Musculoskeletal: Osteopenia with multilevel degenerative changes of the spine. No acute osseous pathology. IMPRESSION: 1. No acute intra-abdominal or pelvic pathology. 2. Colonic diverticulosis. No bowel obstruction. Normal appendix. 3. Aortic Atherosclerosis (ICD10-I70.0). Electronically Signed   By: Elgie Collard M.D.   On: 07/11/2020 18:22   DG Chest 2 View  Result Date: 07/11/2020 CLINICAL DATA:  Chest pain after fall from steps. EXAM: CHEST - 2 VIEW COMPARISON:  Chest CT June 04, 2020 and chest radiograph May 14, 2018 FINDINGS: Prior median sternotomy and CABG. Cardiac silhouette is borderline enlarged, similar prior. No discrete mediastinal or hilar masses. No focal consolidation. No pleural effusion. No pneumothorax. Chronic parenchymal lung changes of COPD. Thoracic spondylosis. IMPRESSION: 1. No acute cardiopulmonary disease. 2. Chronic parenchymal lung changes of COPD. 3. Prior median sternotomy and CABG. Electronically Signed   By: Maudry Mayhew MD   On: 07/11/2020 16:16   DG Elbow Complete Right  Result Date: 07/11/2020 CLINICAL DATA:  Pain after fall EXAM: RIGHT ELBOW - COMPLETE 3+ VIEW COMPARISON:  None. FINDINGS: Calcific density/irregularity along the lateral humeral epicondyle. No evidence of dislocation, or joint effusion. Mild degenerative spurring. Soft tissue swelling about the elbow/forearm. IMPRESSION: 1. Calcific density/irregularity along the lateral humeral epicondyle may represent sequela of prior lateral epicondylitis or less likely a possible avulsion fracture recommend correlation with point tenderness. Electronically Signed   By: Maudry Mayhew MD   On: 07/11/2020 16:20   DG Finger Middle Right  Result Date: 07/11/2020 CLINICAL DATA:  Pain after fall EXAM: RIGHT MIDDLE FINGER 2+V COMPARISON:  None. FINDINGS: There is no evidence of fracture or dislocation. Multifocal degenerative  change most notably at the distal interphalangeal joint. Mineralization along the extensor tendons. Soft tissue swelling about the digit. IMPRESSION: 1. No fracture or dislocation. Soft tissue swelling about the digit. 2. Degenerative change with mineralization along the extensor tendons. Electronically Signed   By: Maudry Mayhew MD   On: 07/11/2020 16:22   ECHOCARDIOGRAM COMPLETE  Result Date: 07/12/2020    ECHOCARDIOGRAM REPORT   Patient Name:   Agustin Swatek. Date of Exam: 07/12/2020 Medical Rec #:  [id number]             Height:       63.0 in Accession #:    [id number]            Weight:       230.0 lb Date of Birth:  [birth year]             BSA:          2.052 m Patient Age:    74 years              BP:           117/70 mmHg Patient Gender: M                     HR:           64 bpm. Exam Location:  Inpatient Procedure: 2D Echo, Intracardiac Opacification Agent, Cardiac Doppler and Color  Doppler Indications:    Chest pain  History:        Patient has no prior history of Echocardiogram examinations.                 CAD, Prior CABG; Risk Factors:Hypertension. CABG x 5 08/16/1999.  Sonographer:    Neomia Dear RDCS Referring Phys: [id number] HAO MENG  Sonographer Comments: Technically difficult study due to poor echo windows. IMPRESSIONS  1. Left ventricular ejection fraction, by estimation, is 60 to 65%. The left ventricle has normal function. The left ventricle has no regional wall motion abnormalities. There is mild left ventricular hypertrophy. Left ventricular diastolic parameters are consistent with Grade I diastolic dysfunction (impaired relaxation). Elevated left ventricular end-diastolic pressure.  2. Right ventricular systolic function is normal. The right ventricular size is mildly enlarged. There is normal pulmonary artery systolic pressure. The estimated right ventricular systolic pressure is 30.2 mmHg.  3. Right atrial size was moderately dilated.  4. The mitral valve is grossly normal.  Trivial mitral valve regurgitation.  5. The aortic valve is tricuspid. Aortic valve regurgitation is trivial. Mild aortic valve sclerosis is present, with no evidence of aortic valve stenosis.  6. The inferior vena cava is normal in size with greater than 50% respiratory variability, suggesting right atrial pressure of 3 mmHg. FINDINGS  Left Ventricle: Left ventricular ejection fraction, by estimation, is 60 to 65%. The left ventricle has normal function. The left ventricle has no regional wall motion abnormalities. Definity contrast agent was given IV to delineate the left ventricular  endocardial borders. The left ventricular internal cavity size was normal in size. There is mild left ventricular hypertrophy. Left ventricular diastolic parameters are consistent with Grade I diastolic dysfunction (impaired relaxation). Elevated left ventricular end-diastolic pressure. Right Ventricle: The right ventricular size is mildly enlarged. No increase in right ventricular wall thickness. Right ventricular systolic function is normal. There is normal pulmonary artery systolic pressure. The tricuspid regurgitant velocity is 2.61  m/s, and with an assumed right atrial pressure of 3 mmHg, the estimated right ventricular systolic pressure is 30.2 mmHg. Left Atrium: Left atrial size was normal in size. Right Atrium: Right atrial size was moderately dilated. Pericardium: There is no evidence of pericardial effusion. Mitral Valve: The mitral valve is grossly normal. Trivial mitral valve regurgitation. Tricuspid Valve: The tricuspid valve is not well visualized. Tricuspid valve regurgitation is trivial. Aortic Valve: The aortic valve is tricuspid. Aortic valve regurgitation is trivial. Mild aortic valve sclerosis is present, with no evidence of aortic valve stenosis. Aortic valve mean gradient measures 7.5 mmHg. Aortic valve peak gradient measures 14.9 mmHg. Aortic valve area, by VTI measures 2.47 cm. Pulmonic Valve: The pulmonic  valve was normal in structure. Pulmonic valve regurgitation is not visualized. Aorta: The aortic root and ascending aorta are structurally normal, with no evidence of dilitation. Venous: The inferior vena cava is normal in size with greater than 50% respiratory variability, suggesting right atrial pressure of 3 mmHg. IAS/Shunts: No atrial level shunt detected by color flow Doppler.  LEFT VENTRICLE PLAX 2D LVIDd:         4.30 cm  Diastology LVIDs:         3.10 cm  LV e' medial:    5.11 cm/s LV PW:         1.10 cm  LV E/e' medial:  20.4 LV IVS:        1.20 cm  LV e' lateral:   6.53 cm/s LVOT diam:  2.00 cm  LV E/e' lateral: 15.9 LV SV:         78 LV SV Index:   38 LVOT Area:     3.14 cm  RIGHT VENTRICLE TAPSE (M-mode): 2.6 cm  PULMONARY VEINS                         A Reversal Duration: 95.00 msec                         A Reversal Velocity: 24.90 cm/s                         Diastolic Velocity:  27.40 cm/s                         S/D Velocity:        1.10                         Systolic Velocity:   31.10 cm/s LEFT ATRIUM             Index       RIGHT ATRIUM           Index LA diam:        3.60 cm 1.75 cm/m  RA Area:     24.20 cm LA Vol (A2C):   24.5 ml 11.94 ml/m RA Volume:   80.00 ml  38.99 ml/m LA Vol (A4C):   42.0 ml 20.47 ml/m LA Biplane Vol: 32.9 ml 16.03 ml/m  AORTIC VALVE                    PULMONIC VALVE AV Area (Vmax):    2.03 cm     PV Vmax:       1.35 m/s AV Area (Vmean):   2.06 cm     PV Vmean:      95.700 cm/s AV Area (VTI):     2.47 cm     PV VTI:        0.249 m AV Vmax:           193.00 cm/s  PV Peak grad:  7.3 mmHg AV Vmean:          126.000 cm/s PV Mean grad:  4.0 mmHg AV VTI:            0.316 m AV Peak Grad:      14.9 mmHg AV Mean Grad:      7.5 mmHg LVOT Vmax:         125.00 cm/s LVOT Vmean:        82.800 cm/s LVOT VTI:          0.248 m LVOT/AV VTI ratio: 0.79  AORTA Ao Root diam: 3.60 cm Ao Asc diam:  3.10 cm MITRAL VALVE                TRICUSPID VALVE MV Area (PHT): 2.83 cm     TR  Peak grad:   27.2 mmHg MV Decel Time: 268 msec     TR Vmax:        261.00 cm/s MV E velocity: 104.00 cm/s MV A velocity: 92.40 cm/s   SHUNTS MV E/A ratio:  1.13         Systemic VTI:  0.25 m  Systemic Diam: 2.00 cm Zoila Shutter MD Electronically signed by Zoila Shutter MD Signature Date/Time: 07/12/2020/2:58:32 PM    Final      Labs:   Basic Metabolic Panel: Recent Labs  Lab 07/11/20 1520 07/12/20 1045 07/13/20 0252  NA 135 135 133*  K 4.5 4.4 4.1  CL 100 102 101  CO2 25 26 25   GLUCOSE 94 97 98  BUN 36* 35* 39*  CREATININE 1.75* 1.52* 1.57*  CALCIUM 9.4 9.6 9.0   GFR Estimated Creatinine Clearance: 45.9 mL/min (A) (by C-G formula based on SCr of 1.57 mg/dL (H)). Liver Function Tests: Recent Labs  Lab 07/11/20 1520  AST 21  ALT 19  ALKPHOS 43  BILITOT 0.9  PROT 6.5  ALBUMIN 3.6   Recent Labs  Lab 07/11/20 1520  LIPASE 48   No results for input(s): AMMONIA in the last 168 hours. Coagulation profile No results for input(s): INR, PROTIME in the last 168 hours.  CBC: Recent Labs  Lab 07/11/20 1512 07/12/20 1045  WBC 16.7* 9.5  HGB 15.1 14.5  HCT 44.0 43.8  MCV 100.0 99.5  PLT 277 269   Cardiac Enzymes: No results for input(s): CKTOTAL, CKMB, CKMBINDEX, TROPONINI in the last 168 hours. BNP: Invalid input(s): POCBNP CBG: No results for input(s): GLUCAP in the last 168 hours. D-Dimer No results for input(s): DDIMER in the last 72 hours. Hgb A1c No results for input(s): HGBA1C in the last 72 hours. Lipid Profile No results for input(s): CHOL, HDL, LDLCALC, TRIG, CHOLHDL, LDLDIRECT in the last 72 hours. Thyroid function studies No results for input(s): TSH, T4TOTAL, T3FREE, THYROIDAB in the last 72 hours.  Invalid input(s): FREET3 Anemia work up No results for input(s): VITAMINB12, FOLATE, FERRITIN, TIBC, IRON, RETICCTPCT in the last 72 hours. Microbiology Recent Results (from the past 240 hour(s))  SARS CORONAVIRUS 2 (TAT 6-24  HRS) Nasopharyngeal Nasopharyngeal Swab     Status: None   Collection Time: 07/11/20  8:46 PM   Specimen: Nasopharyngeal Swab  Result Value Ref Range Status   SARS Coronavirus 2 NEGATIVE NEGATIVE Final    Comment: (NOTE) SARS-CoV-2 target nucleic acids are NOT DETECTED.  The SARS-CoV-2 RNA is generally detectable in upper and lower respiratory specimens during the acute phase of infection. Negative results do not preclude SARS-CoV-2 infection, do not rule out co-infections with other pathogens, and should not be used as the sole basis for treatment or other patient management decisions. Negative results must be combined with clinical observations, patient history, and epidemiological information. The expected result is Negative.  Fact Sheet for Patients: 09/10/20  Fact Sheet for Healthcare Providers: HairSlick.no  This test is not yet approved or cleared by the [employer]ier[employer]ir[employer] FDA and  has been authorized for detection and/or diagnosis of SARS-CoV-2 by FDA under an Emergency Use Authorization (EUA). This EUA will remain  in effect (meaning this test can be used) for the duration of the COVID-19 declaration under Se ction 564(b)(1) of the Act, 21 U.S.C. section 360bbb-3(b)(1), unless the authorization is terminated or revoked sooner.  Performed at [facility], [address] [place], [place]      Discharge Instructions:   Discharge Instructions    Diet - low sodium heart healthy   Complete by: As directed    Discharge instructions   Complete by: As directed    For elbow: resting and icing the affected area- lidocaine patches can also be purchased at a drug store Can use OTC probiotics   Increase  activity slowly   Complete by: As directed      Allergies as of 07/13/2020      Reactions   Icosapent Ethyl Other (See Comments)   Muscle ache   Praluent [alirocumab] Other (See Comments)   Muscle  pain, feel very sick   Statins Other (See Comments)   Muscle pain      Medication List    STOP taking these medications   amLODipine 5 MG tablet Commonly known as: NORVASC   hydrochlorothiazide 12.5 MG capsule Commonly known as: MICROZIDE   ibuprofen 200 MG tablet Commonly known as: ADVIL   metoprolol succinate 50 MG 24 hr tablet Commonly known as: TOPROL-XL     TAKE these medications   acidophilus Caps capsule Take 1 capsule by mouth daily. Start taking on: July 14, 2020   alfuzosin 10 MG 24 hr tablet Commonly known as: UROXATRAL Take 1 tablet (10 mg total) by mouth daily. What changed: when to take this   allopurinol 300 MG tablet Commonly known as: ZYLOPRIM Take 1 tablet (300 mg total) by mouth daily. What changed: when to take this   aspirin 325 MG tablet Take 325 mg by mouth at bedtime.   DRY EYES OP Apply 1 drop to eye daily as needed (dry eyes).   famotidine 20 MG tablet Commonly known as: PEPCID Take 20 mg by mouth daily.   ramipril 10 MG capsule Commonly known as: ALTACE Take 1 capsule (10 mg total) by mouth daily. What changed: when to take this         Time coordinating discharge: 35 min  Signed:  Joseph Art DO  [facility] 07/13/2020, 3:34 PM

## 2020-07-13 NOTE — Progress Notes (Signed)
Progress Note  Patient Name: Chase Wright. Date of Encounter: 07/13/2020  [facility] Cardiologist: [place]  Previously , Chase Wright   Subjective   75 yo with hx of HTN, CKD, CAD / CABG admitted with CP  Scheduled for myoview today  Echo shows normal LV function, mild LVH, grade 1 DD Trivial MR , trivial AI    Inpatient Medications    Scheduled Meds: . acidophilus  1 capsule Oral Daily  . alfuzosin  10 mg Oral QHS  . allopurinol  300 mg Oral QHS  . aspirin  325 mg Oral QHS  . enoxaparin (LOVENOX) injection  40 mg Subcutaneous Q24H  . famotidine  20 mg Oral Daily  . pantoprazole  40 mg Oral Daily   Continuous Infusions:  PRN Meds: acetaminophen, ondansetron (ZOFRAN) IV   Vital Signs    Vitals:   07/12/20 1612 07/12/20 2130 07/13/20 0037 07/13/20 0409  BP: 103/68 124/73 126/73 122/66  Pulse: 73 66 75 65  Resp: 18 17 16 16   Temp: 98.5 F (36.9 C) 99.4 F (37.4 C) 99.2 F (37.3 C) 98.3 F (36.8 C)  TempSrc: Oral Oral Oral Oral  SpO2: 95% 97% 96% 96%  Weight:    105.5 kg  Height:       No intake or output data in the 24 hours ending 07/13/20 0837 Last 3 Weights 07/13/2020 07/11/2020 07/11/2020  Weight (lbs) 232 lb 8 oz 230 lb 239 lb  Weight (kg) 105.461 kg 104.327 kg 108.41 kg      Telemetry    NSR  - Personally Reviewed  ECG     Personally Reviewed  Physical Exam    GEN: No acute distress.   Did not sleep last night  Neck: No JVD Cardiac: RRR, no murmurs, rubs, or gallops.  Respiratory: Clear to auscultation bilaterally. GI: Soft, nontender, non-distended  MS: No edema; No deformity. Neuro:  Nonfocal  Psych: Normal affect   Labs    High Sensitivity Troponin:   Recent Labs  Lab 07/11/20 1520 07/11/20 1712 07/11/20 1930 07/11/20 2130  TROPONINIHS 20* 20* 18* 18*      Chemistry Recent Labs  Lab 07/11/20 1520 07/12/20 1045 07/13/20 0252  NA 135 135 133*  K 4.5 4.4 4.1  CL 100 102 101  CO2 25 26 25   GLUCOSE 94 97 98  BUN 36* 35*  39*  CREATININE 1.75* 1.52* 1.57*  CALCIUM 9.4 9.6 9.0  PROT 6.5  --   --   ALBUMIN 3.6  --   --   AST 21  --   --   ALT 19  --   --   ALKPHOS 43  --   --   BILITOT 0.9  --   --   GFRNONAA 41* 48* 47*  ANIONGAP 10 7 7      Hematology Recent Labs  Lab 07/11/20 1512 07/12/20 1045  WBC 16.7* 9.5  RBC 4.40 4.40  HGB 15.1 14.5  HCT 44.0 43.8  MCV 100.0 99.5  MCH 34.3* 33.0  MCHC 34.3 33.1  RDW 13.1 13.1  PLT 277 269    BNPNo results for input(s): BNP, PROBNP in the last 168 hours.   DDimer No results for input(s): DDIMER in the last 168 hours.   Radiology    CT ABDOMEN PELVIS WO CONTRAST  Result Date: 07/11/2020 CLINICAL DATA:  75 year old male with abdominal pain. EXAM: CT ABDOMEN AND PELVIS WITHOUT CONTRAST TECHNIQUE: Multidetector CT imaging of the abdomen and pelvis was performed following  the standard protocol without IV contrast. COMPARISON:  CT abdomen pelvis dated 06/10/2017. FINDINGS: Evaluation of this exam is limited in the absence of intravenous contrast. Lower chest: The visualized lung bases are clear. There is coronary vascular calcification. No intra-abdominal free air or free fluid. Hepatobiliary: No focal liver abnormality is seen. No gallstones, gallbladder wall thickening, or biliary dilatation. Pancreas: Unremarkable. No pancreatic ductal dilatation or surrounding inflammatory changes. Spleen: Normal in size without focal abnormality. Adrenals/Urinary Tract: The adrenal glands unremarkable. Moderate bilateral renal parenchyma atrophy. There is no hydronephrosis or nephrolithiasis on either side. Multiple bilateral renal cysts measuring up to 8.7 cm in the upper pole of the right kidney. Evaluation of the cysts are very limited in the absence of contrast. Additional subcentimeter hypodensities are too small to characterize. The visualized ureters and urinary bladder appear unremarkable. Stomach/Bowel: There is sigmoid diverticulosis and scattered colonic diverticula  without active inflammatory changes. There is no bowel obstruction. The appendix is normal. Vascular/Lymphatic: Moderate aortoiliac atherosclerotic disease. The IVC is unremarkable. No portal venous gas. There is no adenopathy. Reproductive: The prostate and seminal vesicles are grossly unremarkable. No pelvic masses Other: None Musculoskeletal: Osteopenia with multilevel degenerative changes of the spine. No acute osseous pathology. IMPRESSION: 1. No acute intra-abdominal or pelvic pathology. 2. Colonic diverticulosis. No bowel obstruction. Normal appendix. 3. Aortic Atherosclerosis (ICD10-I70.0). Electronically Signed   By: Elgie Collard M.D.   On: 07/11/2020 18:22   DG Chest 2 View  Result Date: 07/11/2020 CLINICAL DATA:  Chest pain after fall from steps. EXAM: CHEST - 2 VIEW COMPARISON:  Chest CT June 04, 2020 and chest radiograph May 14, 2018 FINDINGS: Prior median sternotomy and CABG. Cardiac silhouette is borderline enlarged, similar prior. No discrete mediastinal or hilar masses. No focal consolidation. No pleural effusion. No pneumothorax. Chronic parenchymal lung changes of COPD. Thoracic spondylosis. IMPRESSION: 1. No acute cardiopulmonary disease. 2. Chronic parenchymal lung changes of COPD. 3. Prior median sternotomy and CABG. Electronically Signed   By: Maudry Mayhew MD   On: 07/11/2020 16:16   DG Elbow Complete Right  Result Date: 07/11/2020 CLINICAL DATA:  Pain after fall EXAM: RIGHT ELBOW - COMPLETE 3+ VIEW COMPARISON:  None. FINDINGS: Calcific density/irregularity along the lateral humeral epicondyle. No evidence of dislocation, or joint effusion. Mild degenerative spurring. Soft tissue swelling about the elbow/forearm. IMPRESSION: 1. Calcific density/irregularity along the lateral humeral epicondyle may represent sequela of prior lateral epicondylitis or less likely a possible avulsion fracture recommend correlation with point tenderness. Electronically Signed   By: Maudry Mayhew MD   On: 07/11/2020 16:20   DG Finger Middle Right  Result Date: 07/11/2020 CLINICAL DATA:  Pain after fall EXAM: RIGHT MIDDLE FINGER 2+V COMPARISON:  None. FINDINGS: There is no evidence of fracture or dislocation. Multifocal degenerative change most notably at the distal interphalangeal joint. Mineralization along the extensor tendons. Soft tissue swelling about the digit. IMPRESSION: 1. No fracture or dislocation. Soft tissue swelling about the digit. 2. Degenerative change with mineralization along the extensor tendons. Electronically Signed   By: Maudry Mayhew MD   On: 07/11/2020 16:22   ECHOCARDIOGRAM COMPLETE  Result Date: 07/12/2020    ECHOCARDIOGRAM REPORT   Patient Name:   Chase Wright. Date of Exam: 07/12/2020 Medical Rec #:  [id number]             Height:       63.0 in Accession #:    [id number]  Weight:       230.0 lb Date of Birth:  02/09/1946             BSA:          2.052 m Patient Age:    74 years              BP:           117/70 mmHg Patient Gender: M                     HR:           64 bpm. Exam Location:  Inpatient Procedure: 2D Echo, Intracardiac Opacification Agent, Cardiac Doppler and Color            Doppler Indications:    Chest pain  History:        Patient has no prior history of Echocardiogram examinations.                 CAD, Prior CABG; Risk Factors:Hypertension. CABG x 5 08/16/1999.  Sonographer:    Neomia DearAMARA CROWN RDCS Referring Phys: [phone] HAO MENG  Sonographer Comments: Technically difficult study due to poor echo windows. IMPRESSIONS  1. Left ventricular ejection fraction, by estimation, is 60 to 65%. The left ventricle has normal function. The left ventricle has no regional wall motion abnormalities. There is mild left ventricular hypertrophy. Left ventricular diastolic parameters are consistent with Grade I diastolic dysfunction (impaired relaxation). Elevated left ventricular end-diastolic pressure.  2. Right ventricular systolic function is normal.  The right ventricular size is mildly enlarged. There is normal pulmonary artery systolic pressure. The estimated right ventricular systolic pressure is 30.2 mmHg.  3. Right atrial size was moderately dilated.  4. The mitral valve is grossly normal. Trivial mitral valve regurgitation.  5. The aortic valve is tricuspid. Aortic valve regurgitation is trivial. Mild aortic valve sclerosis is present, with no evidence of aortic valve stenosis.  6. The inferior vena cava is normal in size with greater than 50% respiratory variability, suggesting right atrial pressure of 3 mmHg. FINDINGS  Left Ventricle: Left ventricular ejection fraction, by estimation, is 60 to 65%. The left ventricle has normal function. The left ventricle has no regional wall motion abnormalities. Definity contrast agent was given IV to delineate the left ventricular  endocardial borders. The left ventricular internal cavity size was normal in size. There is mild left ventricular hypertrophy. Left ventricular diastolic parameters are consistent with Grade I diastolic dysfunction (impaired relaxation). Elevated left ventricular end-diastolic pressure. Right Ventricle: The right ventricular size is mildly enlarged. No increase in right ventricular wall thickness. Right ventricular systolic function is normal. There is normal pulmonary artery systolic pressure. The tricuspid regurgitant velocity is 2.61  m/s, and with an assumed right atrial pressure of 3 mmHg, the estimated right ventricular systolic pressure is 30.2 mmHg. Left Atrium: Left atrial size was normal in size. Right Atrium: Right atrial size was moderately dilated. Pericardium: There is no evidence of pericardial effusion. Mitral Valve: The mitral valve is grossly normal. Trivial mitral valve regurgitation. Tricuspid Valve: The tricuspid valve is not well visualized. Tricuspid valve regurgitation is trivial. Aortic Valve: The aortic valve is tricuspid. Aortic valve regurgitation is trivial.  Mild aortic valve sclerosis is present, with no evidence of aortic valve stenosis. Aortic valve mean gradient measures 7.5 mmHg. Aortic valve peak gradient measures 14.9 mmHg. Aortic valve area, by VTI measures 2.47 cm. Pulmonic Valve: The pulmonic valve was normal in structure. Pulmonic valve  regurgitation is not visualized. Aorta: The aortic root and ascending aorta are structurally normal, with no evidence of dilitation. Venous: The inferior vena cava is normal in size with greater than 50% respiratory variability, suggesting right atrial pressure of 3 mmHg. IAS/Shunts: No atrial level shunt detected by color flow Doppler.  LEFT VENTRICLE PLAX 2D LVIDd:         4.30 cm  Diastology LVIDs:         3.10 cm  LV e' medial:    5.11 cm/s LV PW:         1.10 cm  LV E/e' medial:  20.4 LV IVS:        1.20 cm  LV e' lateral:   6.53 cm/s LVOT diam:     2.00 cm  LV E/e' lateral: 15.9 LV SV:         78 LV SV Index:   38 LVOT Area:     3.14 cm  RIGHT VENTRICLE TAPSE (M-mode): 2.6 cm  PULMONARY VEINS                         A Reversal Duration: 95.00 msec                         A Reversal Velocity: 24.90 cm/s                         Diastolic Velocity:  27.40 cm/s                         S/D Velocity:        1.10                         Systolic Velocity:   31.10 cm/s LEFT ATRIUM             Index       RIGHT ATRIUM           Index LA diam:        3.60 cm 1.75 cm/m  RA Area:     24.20 cm LA Vol (A2C):   24.5 ml 11.94 ml/m RA Volume:   80.00 ml  38.99 ml/m LA Vol (A4C):   42.0 ml 20.47 ml/m LA Biplane Vol: 32.9 ml 16.03 ml/m  AORTIC VALVE                    PULMONIC VALVE AV Area (Vmax):    2.03 cm     PV Vmax:       1.35 m/s AV Area (Vmean):   2.06 cm     PV Vmean:      95.700 cm/s AV Area (VTI):     2.47 cm     PV VTI:        0.249 m AV Vmax:           193.00 cm/s  PV Peak grad:  7.3 mmHg AV Vmean:          126.000 cm/s PV Mean grad:  4.0 mmHg AV VTI:            0.316 m AV Peak Grad:      14.9 mmHg AV Mean Grad:       7.5 mmHg LVOT Vmax:         125.00 cm/s LVOT Vmean:  82.800 cm/s LVOT VTI:          0.248 m LVOT/AV VTI ratio: 0.79  AORTA Ao Root diam: 3.60 cm Ao Asc diam:  3.10 cm MITRAL VALVE                TRICUSPID VALVE MV Area (PHT): 2.83 cm     TR Peak grad:   27.2 mmHg MV Decel Time: 268 msec     TR Vmax:        261.00 cm/s MV E velocity: 104.00 cm/s MV A velocity: 92.40 cm/s   SHUNTS MV E/A ratio:  1.13         Systemic VTI:  0.25 m                             Systemic Diam: 2.00 cm Zoila Shutter MD Electronically signed by Zoila Shutter MD Signature Date/Time: 07/12/2020/2:58:32 PM    Final     Cardiac Studies    Patient Profile     75 yo with cad admitted with cp    Assessment & Plan    1.  Chest pain :  troponins 20,20,18,19  For lexiscan myoview today . If the myoview is low risk, he may be able to be discharged.        For questions or updates, please contact [facility] Please consult [email or web address] for contact info under        Signed, Kristeen Miss, MD  07/13/2020, 8:37 AM

## 2020-07-14 ENCOUNTER — Telehealth: Payer: Self-pay

## 2020-07-14 NOTE — Telephone Encounter (Signed)
I called pt. Per my TOC report, he was recently in the hospital for chest pain. I got him scheduled 07/18/20 for hospital f/u and medications were gone over and reconciled.

## 2020-07-18 ENCOUNTER — Ambulatory Visit (INDEPENDENT_AMBULATORY_CARE_PROVIDER_SITE_OTHER): Payer: Medicare Other | Admitting: Family Medicine

## 2020-07-18 ENCOUNTER — Encounter: Payer: Self-pay | Admitting: Family Medicine

## 2020-07-18 VITALS — BP 122/76 | HR 77 | Temp 99.8°F | Wt 247.6 lb

## 2020-07-18 DIAGNOSIS — R1011 Right upper quadrant pain: Secondary | ICD-10-CM

## 2020-07-18 DIAGNOSIS — F329 Major depressive disorder, single episode, unspecified: Secondary | ICD-10-CM

## 2020-07-18 DIAGNOSIS — K5790 Diverticulosis of intestine, part unspecified, without perforation or abscess without bleeding: Secondary | ICD-10-CM | POA: Diagnosis not present

## 2020-07-18 DIAGNOSIS — I700 Atherosclerosis of aorta: Secondary | ICD-10-CM

## 2020-07-18 DIAGNOSIS — I2510 Atherosclerotic heart disease of native coronary artery without angina pectoris: Secondary | ICD-10-CM

## 2020-07-18 DIAGNOSIS — I959 Hypotension, unspecified: Secondary | ICD-10-CM | POA: Diagnosis not present

## 2020-07-18 DIAGNOSIS — S50311A Abrasion of right elbow, initial encounter: Secondary | ICD-10-CM

## 2020-07-18 DIAGNOSIS — M8589 Other specified disorders of bone density and structure, multiple sites: Secondary | ICD-10-CM

## 2020-07-18 DIAGNOSIS — S5011XD Contusion of right forearm, subsequent encounter: Secondary | ICD-10-CM

## 2020-07-18 NOTE — Progress Notes (Signed)
   Subjective:    Patient ID: Chase Fort., male    DOB: 04-30-1945, 75 y.o.   MRN: [id number]  HPI He is here for follow-up visit after recent hospitalization and evaluation for hypotension with underlying ASHD.  He also injured his right elbow.  He also has a year-long history of right mid quadrant pain that is intermittent in nature with alternating constipation and diarrhea.  He cannot relate this to any gluten type products, milk, greasy foods.  Review of recent hospitalization does show evidence of aortic atherosclerosis, diverticulosis and osteopenia. At the end of the encounter he did mention something about depression.  He has been going through a lot of stress dealing with the death of his longtime partner in the process of moving and dealing with his dogs.  Review of Systems     Objective:   Physical Exam Alert and in no distress.  Cardiac exam shows regular rhythm without murmurs or gallops.  Exam of the right arm shows diffuse ecchymosis and swelling into the fingers.  No palpable tenderness.  Full motion of the arm.  Pain on motion of the elbow.  X-ray shows a possible avulsion fracture in the elbow.       Assessment & Plan:  ASHD (arteriosclerotic heart disease)  Hypotension determined by examination  Abrasion of right elbow, initial encounter  Traumatic ecchymosis of right forearm, subsequent encounter  Osteopenia of multiple sites - Plan: DG Bone Density  Right upper quadrant abdominal pain - Plan: US Abdomen Limited RUQ (LIVER/GB)  Reactive depression  Diverticulosis  Abdominal aortic atherosclerosis (HCC)  I will follow up with the ultrasound and possibly refer to GI if this is nondiagnostic.Heat to your forearm and elbow for 20 minutes 3 times per day and keep your arm elevated to shoulder level.  Use Tylenol for pain relief Discussed depression with him and at this point we will reevaluate that in a couple of weeks.  He was comfortable with that.

## 2020-07-18 NOTE — Patient Instructions (Addendum)
Heat to your forearm and elbow for 20 minutes 3 times per day and keep your arm elevated to shoulder level.  Use Tylenol for pain relief

## 2020-07-31 ENCOUNTER — Ambulatory Visit
Admission: RE | Admit: 2020-07-31 | Discharge: 2020-07-31 | Disposition: A | Discharge status: Home / Self Care | Payer: Medicare Other | Source: Ambulatory Visit | Attending: Family Medicine | Admitting: Family Medicine

## 2020-07-31 DIAGNOSIS — K760 Fatty (change of) liver, not elsewhere classified: Secondary | ICD-10-CM | POA: Diagnosis not present

## 2020-08-01 ENCOUNTER — Encounter: Payer: Self-pay | Admitting: Family Medicine

## 2020-08-01 ENCOUNTER — Ambulatory Visit (INDEPENDENT_AMBULATORY_CARE_PROVIDER_SITE_OTHER): Payer: Medicare Other | Admitting: Family Medicine

## 2020-08-01 ENCOUNTER — Other Ambulatory Visit: Payer: Self-pay

## 2020-08-01 VITALS — BP 138/82 | HR 77 | Temp 98.9°F | Wt 236.6 lb

## 2020-08-01 DIAGNOSIS — F4321 Adjustment disorder with depressed mood: Secondary | ICD-10-CM | POA: Diagnosis not present

## 2020-08-01 DIAGNOSIS — I951 Orthostatic hypotension: Secondary | ICD-10-CM

## 2020-08-01 DIAGNOSIS — R1011 Right upper quadrant pain: Secondary | ICD-10-CM | POA: Diagnosis not present

## 2020-08-01 DIAGNOSIS — S40021D Contusion of right upper arm, subsequent encounter: Secondary | ICD-10-CM

## 2020-08-01 NOTE — Progress Notes (Signed)
   Subjective:    Patient ID: Chase Wright., male    DOB: 15-Jan-1946, 75 y.o.   MRN: [id number]  HPI He is here for consult.  He continues have difficulty with intermittent right upper quadrant pain as well as alternating constipation and diarrhea.  He did have a recent ultrasound which showed no stones but did show a fatty liver. He also complains of intermittent difficulty with dizziness that usually occurs when he goes from lying to standing but this goes away within a few seconds after that. He continues have difficulty with right forearm swelling secondary to a fall prior to his last admission.  Review of Systems     Objective:   Physical Exam Alert and in no distress.  He did become tearful when discussing his recent loss of 46-year relationship and having to move. Right forearm does show swelling but is not warm or tender over the lateral upper forearm.  Blood pressure is recorded.       Assessment & Plan:  Right upper quadrant abdominal pain  Postural hypotension  Grieving  Contusion of right upper extremity, subsequent encounter HIDA scan ordered in order to address her right upper quadrant pain in more detail.  Also recommend using MiraLAX on a regular basis to see if this will help with his alternating constipation and diarrhea. Recommend that he move slower from lying to sitting to standing to see if that that will help with his dizziness symptoms. Recommend heat for 20 minutes 3 times per day for the hematoma. I then discussed the grieving that he is under and strongly encouraged him to get involved in counseling by calling  hospice for bereavement counseling.

## 2020-08-01 NOTE — Patient Instructions (Addendum)
First use it then you stand then you move to hopefully eliminate the dizziness Heat to your forearms 20 minutes 3 times per day Call hospice at [phone]

## 2020-08-02 ENCOUNTER — Telehealth: Payer: Self-pay

## 2020-08-02 NOTE — Telephone Encounter (Signed)
Called pt to advised of hida scan appt 08/11/20 at 9:30am. It will be done at [facility] [place] and he must be fasting. LVM will try pt again later . KH

## 2020-08-09 NOTE — Progress Notes (Signed)
Cardiology Office Note:    Date:  08/10/2020   ID:  Chase Fort., DOB 11/10/1945, MRN [id number]  PCP:  Chase Nian, MD   [facility] Providers Cardiologist:  Chase Wright to update primary MD,subspecialty MD or APP then REFRESH:1}    Referring MD: Chase Nian, MD   Chief Complaint  Patient presents with  . Coronary Artery Disease     Aug 10, 2020:  Chase Wright. is a 75 y.o. male with a hx of CAD, CABG, HTN, CKD I recently saw him int he hospital after he presented with episodes of CP myoview study was low risk. He has a mild inferobasal and apical inferior attenuation  Normal LV function   Was in the hospital a month ago  Is having lightheadedmess when he gets up in the am or after he bends over and stands backup   - orthostasis   Eats and drinks regularly  Knows that he needs to get more exercise   Past Medical History:  Diagnosis Date  . ASHD (arteriosclerotic heart disease)   . Chronic kidney disease    RENAL STONES  . Diverticulitis   . GERD (gastroesophageal reflux disease)   . Hx of colonic polyps   . Hypertension   . Obesity   . PUD (peptic ulcer disease)   . Renal insufficiency     Past Surgical History:  Procedure Laterality Date  . 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 CARDIAC CATHETERIZATION  08/2005  . NASAL ENDOSCOPY  2008  . VIDEO BRONCHOSCOPY Bilateral 05/01/2018   Procedure: VIDEO BRONCHOSCOPY WITHOUT FLUORO;  Surgeon: Chase Cutter, MD;  Location: [facility];  Service: Cardiopulmonary;  Laterality: Bilateral;    Current Medications: Current Meds  Medication Sig  . acidophilus (RISAQUAD) CAPS capsule Take 1 capsule by mouth daily.  Marland Kitchen alfuzosin (UROXATRAL) 10 MG 24 hr tablet Take 1 tablet (10 mg total) by mouth daily.  Marland Kitchen allopurinol (ZYLOPRIM) 300 MG tablet Take 1 tablet (300 mg total) by mouth daily.  . Artificial Tear Ointment (DRY EYES OP) Apply 1 drop to eye daily as needed (dry eyes).  Marland Kitchen aspirin 325 MG tablet  Take 325 mg by mouth at bedtime.  . famotidine (PEPCID) 20 MG tablet Take 20 mg by mouth daily.   . ramipril (ALTACE) 5 MG capsule Take 1 capsule (5 mg total) by mouth daily.  . [DISCONTINUED] ramipril (ALTACE) 10 MG capsule Take 1 capsule (10 mg total) by mouth daily. (Patient taking differently: Take 10 mg by mouth at bedtime.)     Allergies:   Icosapent ethyl, Praluent [alirocumab], and Statins   Social History   Socioeconomic History  . Marital status: Single    Spouse name: Not on file  . Number of children: Not on file  . Years of education: Not on file  . Highest education level: Not on file  Occupational History  . Not on file  Tobacco Use  . Smoking status: Never Smoker  . Smokeless tobacco: Never Used  Vaping Use  . Vaping Use: Never used  Substance and Sexual Activity  . Alcohol use: Yes    Comment: rare beer  . Drug use: No  . Sexual activity: Not Currently  Other Topics Concern  . Not on file  Social History Narrative  . Not on file   Social Determinants of Health   Financial Resource Strain: Not on file  Food Insecurity: Not on file  Transportation Needs: Not on file  Physical Activity: Not on file  Stress: Not on file  Social Connections: Not on file     Family History: The patient's family history includes Diabetes in his mother; Prostate cancer (age of onset: 28) in his father. There is no history of Colon cancer.  ROS:   Please see the history of present illness.     All other systems reviewed and are negative.  EKGs/Labs/Other Studies Reviewed:    The following studies were reviewed today:   EKG:     Recent Labs: 07/11/2020: ALT 19 07/12/2020: Hemoglobin 14.5; Platelets 269 07/13/2020: BUN 39; Creatinine, Ser 1.57; Potassium 4.1; Sodium 133  Recent Lipid Panel    Component Value Date/Time   CHOL 203 (H) 01/27/2020 1451   TRIG 140 01/27/2020 1451   HDL 33 (L) 01/27/2020 1451   CHOLHDL 6.2 (H) 01/27/2020 1451   CHOLHDL 6.2 (H) 07/25/2016  1356   VLDL 29 07/25/2016 1356   LDLCALC 144 (H) 01/27/2020 1451     Risk Assessment/Calculations:       Physical Exam:    VS:  BP 132/82   Pulse 76   Ht 5\' 4"  (1.626 m)   Wt 227 lb 3.2 oz (103.1 kg)   SpO2 98%   BMI 39.00 kg/m     Wt Readings from Last 3 Encounters:  08/10/20 227 lb 3.2 oz (103.1 kg)  08/01/20 236 lb 9.6 oz (107.3 kg)  07/18/20 247 lb 9.6 oz (112.3 kg)     GEN:  Middle age, moderately obese male,  HEENT: Normal NECK: No JVD; No carotid bruits LYMPHATICS: No lymphadenopathy CARDIAC: RRR, no murmurs, rubs, gallops RESPIRATORY:  Clear to auscultation without rales, wheezing or rhonchi  ABDOMEN: Soft, non-tender, non-distended MUSCULOSKELETAL:  No edema; No deformity  SKIN: Warm and dry NEUROLOGIC:  Alert and oriented x 3 PSYCHIATRIC:  Normal affect   ASSESSMENT:    1. Hx of CABG   2. Hyperlipidemia, unspecified hyperlipidemia type    PLAN:      1. Coronary artery disease: Chase Wright seems to be feeling fairly well.  Is not having any episodes of angina.  Lightheadedness: He complains of significant orthostatic symptoms.  His blood pressure is fairly normal today but I would like to decrease his ramipril to 5 mg a day to see if it will help with his orthostasis.  I have encouraged him to work on weight loss to get in better shape.  I suspect this might help.          Medication Adjustments/Labs and Tests Ordered: Current medicines are reviewed at length with the patient today.  Concerns regarding medicines are outlined above.  No orders of the defined types were placed in this encounter.  Meds ordered this encounter  Medications  . ramipril (ALTACE) 5 MG capsule    Sig: Take 1 capsule (5 mg total) by mouth daily.    Dispense:  90 capsule    Refill:  3    Patient Instructions  Medication Instructions:  Your physician has recommended you make the following change in your medication:   DECREASE Ramipril to 5mg  daily  *If you need a  refill on your cardiac medications before your next appointment, please call your pharmacy*   Lab Work: none If you have labs (blood work) drawn today and your tests are completely normal, you will receive your results only by: Chase Wright MyChart Message (if you have MyChart) OR . A paper copy in the mail If you have any lab test that is  abnormal or we need to change your treatment, we will call you to review the results.   Testing/Procedures: none   Follow-Up: At [facility], you and your health needs are our priority.  As part of our continuing mission to provide you with exceptional heart care, we have created designated Provider Care Teams.  These Care Teams include your primary Cardiologist (physician) and Advanced Practice Providers (APPs -  Physician Assistants and Nurse Practitioners) who all work together to provide you with the care you need, when you need it.  We recommend signing up for the patient portal called "MyChart".  Sign up information is provided on this After Visit Summary.  MyChart is used to connect with patients for Virtual Visits (Telemedicine).  Patients are able to view lab/test results, encounter notes, upcoming appointments, etc.  Non-urgent messages can be sent to your provider as well.   To learn more about what you can do with MyChart, go to [email or web address].    Your next appointment:   6 month(s)  The format for your next appointment:   In Person  Provider:   You will see one of the following Advanced Practice Providers on your designated Care Team:    Tereso Newcomer, PA-C  Chelsea Aus, [place]      Signed, Kristeen Miss, MD  08/10/2020 3:35 PM    [facility]

## 2020-08-10 ENCOUNTER — Encounter (INDEPENDENT_AMBULATORY_CARE_PROVIDER_SITE_OTHER): Payer: Self-pay

## 2020-08-10 ENCOUNTER — Encounter: Payer: Self-pay | Admitting: Cardiovascular Disease

## 2020-08-10 ENCOUNTER — Other Ambulatory Visit: Payer: Self-pay

## 2020-08-10 ENCOUNTER — Ambulatory Visit: Payer: Medicare Other | Admitting: Cardiovascular Disease

## 2020-08-10 VITALS — BP 132/82 | HR 76 | Ht 64.0 in | Wt 227.2 lb

## 2020-08-10 DIAGNOSIS — E785 Hyperlipidemia, unspecified: Secondary | ICD-10-CM

## 2020-08-10 DIAGNOSIS — Z951 Presence of aortocoronary bypass graft: Secondary | ICD-10-CM

## 2020-08-10 MED ORDER — RAMIPRIL 5 MG PO CAPS: 5.0000 mg | ORAL_CAPSULE | Freq: Every day | ORAL | 3 refills | Status: DC

## 2020-08-10 NOTE — Patient Instructions (Signed)
Medication Instructions:  Your physician has recommended you make the following change in your medication:   DECREASE Ramipril to 5mg  daily  *If you need a refill on your cardiac medications before your next appointment, please call your pharmacy*   Lab Work: none If you have labs (blood work) drawn today and your tests are completely normal, you will receive your results only by: MyChart Message (if you have MyChart) OR . A paper copy in the mail If you have any lab test that is abnormal or we need to change your treatment, we will call you to review the results.   Testing/Procedures: none   Follow-Up: At [facility], you and your health needs are our priority.  As part of our continuing mission to provide you with exceptional heart care, we have created designated Provider Care Teams.  These Care Teams include your primary Cardiologist (physician) and Advanced Practice Providers (APPs -  Physician Assistants and Nurse Practitioners) who all work together to provide you with the care you need, when you need it.  We recommend signing up for the patient portal called "MyChart".  Sign up information is provided on this After Visit Summary.  MyChart is used to connect with patients for Virtual Visits (Telemedicine).  Patients are able to view lab/test results, encounter notes, upcoming appointments, etc.  Non-urgent messages can be sent to your provider as well.   To learn more about what you can do with MyChart, go to [facility].    Your next appointment:   6 month(s)  The format for your next appointment:   In Person  Provider:   You will see one of the following Advanced Practice Providers on your designated Care Team:    [email or web address], PA-C  Vin [place], [place]

## 2020-08-11 ENCOUNTER — Ambulatory Visit (HOSPITAL_COMMUNITY)
Admission: RE | Admit: 2020-08-11 | Discharge: 2020-08-11 | Disposition: A | Discharge status: Home / Self Care | Payer: Medicare Other | Source: Ambulatory Visit | Attending: Family Medicine | Admitting: Family Medicine

## 2020-08-11 DIAGNOSIS — R1011 Right upper quadrant pain: Secondary | ICD-10-CM | POA: Diagnosis not present

## 2020-08-11 DIAGNOSIS — R197 Diarrhea, unspecified: Secondary | ICD-10-CM | POA: Diagnosis not present

## 2020-08-11 MED ORDER — TECHNETIUM TC 99M MEBROFENIN IV KIT
5.5000 | PACK | Freq: Once | INTRAVENOUS | Status: AC | PRN
  Administered 2020-08-11: 5.5 via INTRAVENOUS

## 2020-08-14 ENCOUNTER — Telehealth: Payer: Self-pay

## 2020-08-14 NOTE — Telephone Encounter (Signed)
LVM for pt tp call office back and advise if he preference on a GI doctor. KH

## 2020-08-15 ENCOUNTER — Other Ambulatory Visit: Payer: Self-pay

## 2020-08-15 DIAGNOSIS — R1011 Right upper quadrant pain: Secondary | ICD-10-CM

## 2020-08-25 ENCOUNTER — Telehealth: Payer: Self-pay

## 2020-08-25 NOTE — Telephone Encounter (Signed)
Pt fell and wants referral for P/T for back and legs please advise [facility]

## 2020-08-26 NOTE — Telephone Encounter (Signed)
I need to see him first.

## 2020-08-28 NOTE — Telephone Encounter (Signed)
Done KH 

## 2020-08-30 ENCOUNTER — Ambulatory Visit: Payer: Self-pay | Admitting: Family Medicine

## 2020-09-21 ENCOUNTER — Ambulatory Visit (INDEPENDENT_AMBULATORY_CARE_PROVIDER_SITE_OTHER): Payer: Medicare Other | Admitting: Family Medicine

## 2020-09-21 ENCOUNTER — Encounter: Payer: Self-pay | Admitting: Family Medicine

## 2020-09-21 ENCOUNTER — Other Ambulatory Visit: Payer: Self-pay

## 2020-09-21 VITALS — BP 150/92 | HR 77 | Temp 98.9°F | Wt 232.8 lb

## 2020-09-21 DIAGNOSIS — M545 Low back pain, unspecified: Secondary | ICD-10-CM | POA: Diagnosis not present

## 2020-09-21 DIAGNOSIS — I10 Essential (primary) hypertension: Secondary | ICD-10-CM | POA: Diagnosis not present

## 2020-09-21 DIAGNOSIS — R609 Edema, unspecified: Secondary | ICD-10-CM

## 2020-09-21 MED ORDER — LISINOPRIL-HYDROCHLOROTHIAZIDE 10-12.5 MG PO TABS
1.0000 | ORAL_TABLET | Freq: Every day | ORAL | 3 refills | Status: DC
Start: 2020-09-21 — End: 2021-10-03

## 2020-09-21 NOTE — Patient Instructions (Addendum)
Stop the ramipril.  I will switch you to a new blood pressure medicine that will have a fluid pill in it.  When you sit keep your feet elevated.  See you in 2 weeks 2 Tylenol 4 times a day as for your back

## 2020-09-21 NOTE — Progress Notes (Signed)
   Subjective:    Patient ID: Chase Fort., male    DOB: Aug 24, 1945, 75 y.o.   MRN: [id number]  HPI He is here for consult concerning a fall that occurred approximately 4 months ago.  He apparently tripped over his feet injuring his back and is still having some back pain from this.  Also during the same timeframe he was admitted to the hospital evaluated for cardiac issues which turned out to be negative.  He does complain of bilateral leg edema and thinks that this started around the same time which is also the time that his blood pressure medicines were readjusted as he became hypotensive.  He complains of tingling sensation in his feet and edema.  He has not really paid attention as to whether the swelling goes down when he wakes up in the morning.   Review of Systems     Objective:   Physical Exam Alert and in no distress.  Cardiac exam shows regular rhythm without murmurs or gallops.  Lungs are clear to auscultation.  Exam of his feet does show 2-3+ edema.  Reflexes are normal.  Normal sensation.       Assessment & Plan:  Low back pain without sciatica, unspecified back pain laterality, unspecified chronicity - Plan: DG Lumbar Spine Complete  Dependent edema  Primary hypertension - Plan: Comprehensive metabolic panel Stop the ramipril.  I will switch you to a new blood pressure medicine that will have a fluid pill in it.  When you sit keep your feet elevated.  See you in 2 weeks 2 Tylenol 4 times a day as for your back I will attempt with switching to the lisinopril/HCTZ to help him get rid of some of the fluids.  Think most of this is dependent edema.  He does state that it is interfering with his walking.  Recheck here in 2 weeks.

## 2020-09-22 LAB — COMPREHENSIVE METABOLIC PANEL
ALT: 15 IU/L (ref 0–44)
AST: 19 IU/L (ref 0–40)
Albumin/Globulin Ratio: 1.8 (ref 1.2–2.2)
Albumin: 4.4 g/dL (ref 3.7–4.7)
Alkaline Phosphatase: 55 IU/L (ref 44–121)
BUN/Creatinine Ratio: 18 (ref 10–24)
BUN: 25 mg/dL (ref 8–27)
Bilirubin Total: 0.5 mg/dL (ref 0.0–1.2)
CO2: 26 mmol/L (ref 20–29)
Calcium: 9.9 mg/dL (ref 8.6–10.2)
Chloride: 99 mmol/L (ref 96–106)
Creatinine, Ser: 1.4 mg/dL — ABNORMAL HIGH (ref 0.76–1.27)
Globulin, Total: 2.4 g/dL (ref 1.5–4.5)
Glucose: 85 mg/dL (ref 65–99)
Potassium: 4.7 mmol/L (ref 3.5–5.2)
Sodium: 139 mmol/L (ref 134–144)
Total Protein: 6.8 g/dL (ref 6.0–8.5)
eGFR: 53 mL/min/{1.73_m2} — ABNORMAL LOW (ref >59)

## 2020-09-29 ENCOUNTER — Ambulatory Visit
Admission: RE | Admit: 2020-09-29 | Discharge: 2020-09-29 | Disposition: A | Discharge status: Home / Self Care | Payer: Medicare Other | Source: Ambulatory Visit | Attending: Family Medicine | Admitting: Family Medicine

## 2020-09-29 ENCOUNTER — Other Ambulatory Visit: Payer: Self-pay

## 2020-10-10 ENCOUNTER — Other Ambulatory Visit: Payer: Self-pay

## 2020-10-10 ENCOUNTER — Ambulatory Visit (INDEPENDENT_AMBULATORY_CARE_PROVIDER_SITE_OTHER): Payer: Medicare Other | Admitting: Family Medicine

## 2020-10-10 VITALS — BP 112/78 | HR 63 | Temp 97.8°F | Wt 229.2 lb

## 2020-10-10 DIAGNOSIS — R609 Edema, unspecified: Secondary | ICD-10-CM

## 2020-10-10 DIAGNOSIS — M545 Low back pain, unspecified: Secondary | ICD-10-CM

## 2020-10-10 DIAGNOSIS — I10 Essential (primary) hypertension: Secondary | ICD-10-CM | POA: Diagnosis not present

## 2020-10-10 NOTE — Progress Notes (Signed)
   Subjective:    Patient ID: Chase Wright., male    DOB: Mar 06, 1946, 75 y.o.   MRN: [id number]  HPI He is here for recheck.  He has noted an improvement in his swelling.  He does continue to complain of back pain.  Recent x-rays do show evidence of arthritis.   Review of Systems     Objective:   Physical Exam  Alert and in no distress.  Blood pressure is recorded.      Assessment & Plan:   Low back pain without sciatica, unspecified back pain laterality, unspecified chronicity - Plan: Ambulatory referral to Physical Therapy  Dependent edema  Primary hypertension I will send him to physical therapy to help with his back.  We will also help with general conditioning.  He does occasionally have slight dizziness and I warned him to get up very slowly from 1 position to another.  If he keeps having difficulty with that we might need to be addressed the whole issue.  He was comfortable with that.

## 2020-10-17 ENCOUNTER — Telehealth: Payer: Self-pay | Admitting: Family Medicine

## 2020-10-17 NOTE — Telephone Encounter (Signed)
   Chase Fort. DOB: Nov 20, 1945 MRN: [id number]   RIDER WAIVER AND RELEASE OF LIABILITY  For purposes of improving physical access to our facilities, [facility] is pleased to partner with third parties to provide [facility] patients or other authorized individuals the option of convenient, on-demand ground transportation services (the [employer]") through use of the technology service that enables users to request on-demand ground transportation from independent third-party providers.  By opting to use and accept these [employer], I, the undersigned, hereby agree on behalf of myself, and on behalf of any minor child using the Science writer for whom I am the parent or legal guardian, as follows:  Science writer provided to me are provided by independent third-party transportation providers who are not [employer] or employees and who are unaffiliated with [employer]. [place] is neither a transportation carrier nor a common or public carrier. [facility] has no control over the quality or safety of the transportation that occurs as a result of the [employer]. [facility] cannot guarantee that any third-party transportation provider will complete any arranged transportation service. [facility] makes no representation, warranty, or guarantee regarding the reliability, timeliness, quality, safety, suitability, or availability of any of the Transport Services or that they will be error free. I fully understand that traveling by vehicle involves risks and dangers of serious bodily injury, including permanent disability, paralysis, and death. I agree, on behalf of myself and on behalf of any minor child using the Transport Services for whom I am the parent or legal guardian, that the entire risk arising out of my use of the [employer] remains solely with me, to the maximum extent permitted under applicable law. The [employer] are  provided "as is" and "as available." [facility] disclaims all representations and warranties, express, implied or statutory, not expressly set out in these terms, including the implied warranties of merchantability and fitness for a particular purpose. I hereby waive and release [facility], its agents, employees, officers, directors, representatives, insurers, attorneys, assigns, successors, subsidiaries, and affiliates from any and all past, present, or future claims, demands, liabilities, actions, causes of action, or suits of any kind directly or indirectly arising from acceptance and use of the [employer]. I further waive and release [facility] and its affiliates from all present and future liability and responsibility for any injury or death to persons or damages to property caused by or related to the use of the [employer]. I have read this Waiver and Release of Liability, and I understand the terms used in it and their legal significance. This Waiver is freely and voluntarily given with the understanding that my right (as well as the right of any minor child for whom I am the parent or legal guardian using the [employer]) to legal recourse against [place] in connection with the [employer] is knowingly surrendered in return for use of these services.   I attest that I read the consent document to Chase Fort., gave Mr. Golson the opportunity to ask questions and answered the questions asked (if any). I affirm that Chase Fort. then provided consent for he's participation in this program.     Chase Wright

## 2020-10-23 ENCOUNTER — Ambulatory Visit: Payer: Medicare Other | Admitting: Rehabilitative and Restorative Service Providers"

## 2020-10-23 ENCOUNTER — Other Ambulatory Visit: Payer: Self-pay

## 2020-10-23 ENCOUNTER — Encounter: Payer: Self-pay | Admitting: Rehabilitative and Restorative Service Providers"

## 2020-10-23 DIAGNOSIS — R262 Difficulty in walking, not elsewhere classified: Secondary | ICD-10-CM

## 2020-10-23 DIAGNOSIS — M545 Low back pain, unspecified: Secondary | ICD-10-CM | POA: Diagnosis not present

## 2020-10-23 DIAGNOSIS — G8929 Other chronic pain: Secondary | ICD-10-CM

## 2020-10-23 NOTE — Therapy (Signed)
[facility] [address] [place], [place], [address] Phone: [phone]   Fax:  [phone]  Physical Therapy Evaluation  Patient Details  Name: Chase FortJames I Goodspeed Jr. MRN: [id number] Date of Birth: 1945-06-28 Referring Provider (Chase Wright): Dr. Sharlot GowdaJohn Lalonde MD   Encounter Date: 10/23/2020   Chase Wright End of Session - 10/23/20 1340     Visit Number 1    Number of Visits 20    Date for Chase Wright Re-Evaluation 01/01/21    Authorization Type UHC Medicare    Progress Note Due on Visit 10    Chase Wright Start Time 1343    Chase Wright Stop Time 1413    Chase Wright Time Calculation (min) 30 min    Activity Tolerance Patient tolerated treatment well    Behavior During Therapy [facility] for tasks assessed/performed             Past Medical History:  Diagnosis Date   ASHD (arteriosclerotic heart disease)    Chronic kidney disease    RENAL STONES   Diverticulitis    GERD (gastroesophageal reflux disease)    Hx of colonic polyps    Hypertension    Obesity    PUD (peptic ulcer disease)    Renal insufficiency     Past Surgical History:  Procedure Laterality Date   BYPASS GRAFT  08/16/1999   CARDIAC CATHETERIZATION  08/2005   NASAL ENDOSCOPY  2008   VIDEO BRONCHOSCOPY Bilateral 05/01/2018   Procedure: VIDEO BRONCHOSCOPY WITHOUT FLUORO;  Surgeon: Luciano CutterEllison, Chi Jane, MD;  [facility];  Service: Cardiopulmonary;  Laterality: Bilateral;    There were no vitals filed for this visit.    Subjective Assessment - 10/23/20 1346     Subjective Chase Wright. indicated fall on cement in Feb that resulted in back pain.  Chase Wright. indicated complaints noted after sitting prolonged and trying to stand and walk.  Also indicated similar complaints after standing prolonged and trying to get walking again.  Chase Wright. indicated xray was negative for fracture.  Chase Wright. indicated having bilateral leg swelling as well (history of cardiac issues).  Chase Wright. indicated recent start of blood pressure medicine with diuretic in it.  Chase Wright.  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  Also has some complaints and concerns about walking with dog.    Limitations House hold activities;Walking;Standing;Lifting    Diagnostic tests xray on back    Patient Stated Goals Reduce pain    Currently in Pain? Yes    Pain Score 7    at worst   Pain Location Back    Pain Orientation Lower    Pain Descriptors / Indicators Sharp    Pain Type Chronic pain    Pain Radiating Towards some complaints of feet numbness/tingling (was checked for DM and negative)    Pain Onset More than a month ago    Pain Frequency Intermittent    Aggravating Factors  transfers and movement after prolonged inactivity, walking/balance concerns    Pain Relieving Factors BC powder                [facility] Chase Wright Assessment - 10/23/20 0001       Assessment   Medical Diagnosis Low back pain    Referring Provider (Chase Wright) Dr. Sharlot GowdaJohn Lalonde MD    Onset Date/Surgical Date 05/09/20    Hand Dominance Right      Precautions   Precautions None  Restrictions   Weight Bearing Restrictions No      Balance Screen   Has the patient fallen in the past 6 months Yes    How many times? 1    Has the patient had a decrease in activity level because of a fear of falling?  No    Is the patient reluctant to leave their home because of a fear of falling?  No      Home [employer]ist information centre manager residence    Living Arrangements Alone    Additional Comments Ground floor 1 bedroom apartment with smaller dogs      Prior Function   Level of Independence Independent    Vocation Requirements Retired    Insurance underwriter, walk dogs      Cognition   Overall Cognitive Status Within Functional Limits for tasks assessed      Observation/Other Assessments   Focus on Therapeutic Outcomes (FOTO)  intake 35%, predicted 64%      Sensation   Light Touch Not tested       Posture/Postural Control   Posture/Postural Control Postural limitations    Postural Limitations Increased thoracic kyphosis;Decreased lumbar lordosis      ROM / Strength   AROM / PROM / Strength Strength;PROM;AROM      AROM   AROM Assessment Site Lumbar;Hip    Lumbar Flexion to ankles, no complaints    Lumbar Extension 25% WFL c back tightness.  x 5 repeated in standing improved to 50% and reduced tightness      PROM   PROM Assessment Site Hip    Right/Left Hip Left;Right      Strength   Overall Strength Comments Myotome check WFL for bilateral LE at this time    Strength Assessment Site Ankle;Knee;Hip    Right/Left Hip Left;Right    Right/Left Knee Left;Right    Right/Left Ankle Left;Right      Transfers   Transfers Sit to Stand    Sit to Stand 7: Independent;Uncontrolled descent      Ambulation/Gait   Gait Comments Independent  wider base of support, shortened stride lenght, gait speed                        Objective measurements completed on examination: See above findings.       [facility] Adult Chase Wright Treatment/Exercise - 10/23/20 0001       Exercises   Exercises Other Exercises;Lumbar    Other Exercises  HEP instruction/performance c cues for techniques, handout provided.  Trial set performed of each for comprehension and symptom assessment.  HEP consisting of lumbar extension standing, supine LTR stretch bilateral, supine bridge, sit to stand transfers                    Chase Wright Education - 10/23/20 1339     Education Details HEP, POC    Person(s) Educated Patient    Methods Explanation;Demonstration;Verbal cues;Handout    Comprehension Returned demonstration;Verbalized understanding              Chase Wright Short Term Goals - 10/23/20 1340       Chase Wright SHORT TERM GOAL #1   Title Patient will demonstrate independent use of home exercise program to maintain progress from in clinic treatments.    Time 3    Period Weeks    Status New    Target  Date 11/13/20  Chase Wright Long Term Goals - 10/23/20 1340       Chase Wright LONG TERM GOAL #1   Title Patient will demonstrate/report pain at worst less than or equal to 2/10 to facilitate minimal limitation in daily activity secondary to pain symptoms.    Time 10    Period Weeks    Status New    Target Date 01/01/21      Chase Wright LONG TERM GOAL #2   Title Patient will demonstrate independent use of home exercise program to facilitate ability to maintain/progress functional gains from skilled physical therapy services.    Time 10    Period Weeks    Status New    Target Date 01/01/21      Chase Wright LONG TERM GOAL #3   Title Chase Wright. will demonstrate FOTO outcome > or = 64% to indicated reduced disability due to condition.    Time 10    Period Weeks    Status New    Target Date 01/01/21      Chase Wright LONG TERM GOAL #4   Title Patient will demonstrate lumbar extension 100 % WFL s symptoms to facilitate upright standing, walking posture at PLOF s limitation.    Time 10    Period Weeks    Status New    Target Date 01/01/21      Chase Wright LONG TERM GOAL #5   Title Chase Wright. will demonstrate sit to stand transfers from 18 inch chair s UE assist and good control.    Time 10    Period Weeks    Status New    Target Date 01/01/21                    Plan - 10/23/20 1341     Clinical Impression Statement Patient is a 75 y.o. who comes to clinic with complaints of low back pain with mobility deficits that impair their ability to perform usual daily and recreational functional activities without increase difficulty/symptoms at this time.  Patient to benefit from skilled Chase Wright services to address impairments and limitations to improve to previous level of function without restriction secondary to condition.    Personal Factors and Comorbidities Comorbidity 3+    Comorbidities HTN, history of dizziness, GERD, chronic kidney disease(stones),    Examination-Activity Limitations Sit;Bend;Stand;Locomotion  Level;Transfers;Hygiene/Grooming    Examination-Participation Restrictions Community Activity;Driving;Interpersonal Relationship;Other   walking dogs   Stability/Clinical Decision Making Stable/Uncomplicated    Clinical Decision Making Low    Rehab Potential Good    Chase Wright Frequency 2x / week    Chase Wright Duration Other (comment)   10 weeks   Chase Wright Treatment/Interventions ADLs/Self Care Home Management;Cryotherapy;Electrical Stimulation;Iontophoresis 4mg /ml Dexamethasone;Moist Heat;Traction;Balance training;Therapeutic exercise;Therapeutic activities;Functional mobility training;Stair training;Gait training;DME Instruction;Ultrasound;Neuromuscular re-education;Patient/family education;Passive range of motion;Spinal Manipulations;Joint Manipulations;Dry needling;Taping;Manual techniques    Chase Wright Next Visit Plan Progress lumbar mobility, balance intervention    Chase Wright Home Exercise Plan [email or web address]    Consulted and Agree with Plan of Care Patient             Patient will benefit from skilled therapeutic intervention in order to improve the following deficits and impairments:  Abnormal gait, Decreased endurance, Hypomobility, Pain, Decreased strength, Decreased activity tolerance, Decreased balance, Decreased mobility, Difficulty walking, Improper body mechanics, Impaired perceived functional ability, Postural dysfunction, Impaired flexibility, Decreased coordination, Decreased range of motion  Visit Diagnosis: Chronic low back pain without sciatica, unspecified back pain laterality  Difficulty in walking, not elsewhere classified     Problem List Patient Active Problem List  Diagnosis Date Noted   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   Diverticulosis 07/18/2020   Chest pain, rule out acute myocardial infarction 07/11/2020   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   Morbid obesity (HCC) 01/27/2020   OAB (overactive bladder) 01/27/2020   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 01/18/2019   Sebaceous cyst 01/18/2019    Abdominal aortic atherosclerosis (HCC) 06/10/2017   Arthritis 06/10/2017   History of renal stone 07/25/2016   Benign prostatic hyperplasia with lower urinary tract symptoms 07/25/2016   Hypertension 02/25/2012   Renal insufficiency 02/25/2012   Gastroesophageal reflux disease without esophagitis 02/25/2012   ASHD (arteriosclerotic heart disease) 02/25/2012   History of gout 02/25/2012   History of colonic polyps 02/25/2012   Hyperlipidemia 02/19/2011   Statin intolerance 02/19/2011    Chase Wright, Chase Wright, Chase Wright, Chase Wright, Chase Wright 10/23/20  2:27 PM    [facility] [address] [place], [place], [address] Phone: [phone]   Fax:  [phone]  Name: Chase Wright. MRN: [id number] Date of Birth: 1945-09-04

## 2020-10-23 NOTE — Patient Instructions (Signed)
Access Code: [email or web address] URL: [email or web address] Date: 10/23/2020 Prepared by: Chyrel Masson  Exercises Supine Lower Trunk Rotation - 2 x daily - 7 x weekly - 1 sets - 5 reps - 15 hold Supine Bridge - 2 x daily - 7 x weekly - 3 sets - 10 reps - 2 hold Standing Lumbar Extension - 2 x daily - 7 x weekly - 2-3 sets - 10 reps Sit to Stand - 1 x daily - 7 x weekly - 3 sets - 10 reps

## 2020-11-07 ENCOUNTER — Encounter: Payer: Self-pay | Admitting: Rehabilitative and Restorative Service Providers"

## 2020-11-07 ENCOUNTER — Other Ambulatory Visit: Payer: Self-pay

## 2020-11-07 ENCOUNTER — Ambulatory Visit: Payer: Medicare Other | Admitting: Rehabilitative and Restorative Service Providers"

## 2020-11-07 DIAGNOSIS — M545 Low back pain, unspecified: Secondary | ICD-10-CM

## 2020-11-07 DIAGNOSIS — R262 Difficulty in walking, not elsewhere classified: Secondary | ICD-10-CM | POA: Diagnosis not present

## 2020-11-07 DIAGNOSIS — G8929 Other chronic pain: Secondary | ICD-10-CM

## 2020-11-07 NOTE — Therapy (Signed)
[facility] [address] [place], [place], [address] Phone: [phone]   Fax:  [phone]  Physical Therapy Treatment  Patient Details  Name: Chase Wright. MRN: [id number] Date of Birth: 21-Mar-1946 Referring Provider (PT): Dr. Sharlot Gowda MD   Encounter Date: 11/07/2020   PT End of Session - 11/07/20 1341     Visit Number 2    Number of Visits 20    Date for PT Re-Evaluation 01/01/21    Authorization Type UHC Medicare    Progress Note Due on Visit 10    PT Start Time 1341    PT Stop Time 1420    PT Time Calculation (min) 39 min    Activity Tolerance Patient tolerated treatment well    Behavior During Therapy [facility] for tasks assessed/performed             Past Medical History:  Diagnosis Date   ASHD (arteriosclerotic heart disease)    Chronic kidney disease    RENAL STONES   Diverticulitis    GERD (gastroesophageal reflux disease)    Hx of colonic polyps    Hypertension    Obesity    PUD (peptic ulcer disease)    Renal insufficiency     Past Surgical History:  Procedure Laterality Date   BYPASS GRAFT  08/16/1999   CARDIAC CATHETERIZATION  08/2005   NASAL ENDOSCOPY  2008   VIDEO BRONCHOSCOPY Bilateral 05/01/2018   Procedure: VIDEO BRONCHOSCOPY WITHOUT FLUORO;  Surgeon: Luciano Cutter, MD;  [facility];  Service: Cardiopulmonary;  Laterality: Bilateral;    There were no vitals filed for this visit.   Subjective Assessment - 11/07/20 1343     Subjective Pt. indicated feeling pain noted in start of activity in HEP.  He felt improvement c doing some exercises but symptoms return after sitting prolonged. Pt. indicated he can't do it on the bed because its soft and he does get on the floor to do it.  Pt. indicated balance troubles c standing exercises/chair transfers.    Limitations House hold activities;Walking;Standing;Lifting    Diagnostic tests xray on back    Patient Stated Goals Reduce pain     Currently in Pain? Yes    Pain Score 6     Pain Location Back    Pain Orientation Lower    Pain Descriptors / Indicators Sharp;Tightness    Pain Type Chronic pain    Pain Onset More than a month ago    Pain Frequency Intermittent    Aggravating Factors  prolonged inactivity posiitoning    Pain Relieving Factors exerise movements eventually helps                [facility] PT Assessment - 11/07/20 0001       Assessment   Medical Diagnosis Low back pain    Referring Provider (PT) Dr. Sharlot Gowda MD    Onset Date/Surgical Date 05/09/20    Hand Dominance Right                           [facility] Adult PT Treatment/Exercise - 11/07/20 0001       Lumbar Exercises: Stretches   Lower Trunk Rotation 5 reps;Other (comment)   15 sec x 5 bilateral     Lumbar Exercises: Standing   Other Standing Lumbar Exercises lumbar extension standing 2 x 5      Lumbar Exercises: Seated   Other Seated Lumbar Exercises 18 inch chair s UE  assist 2 x 5      Lumbar Exercises: Supine   Bridge Other (comment)   2 x 10     Manual Therapy   Manual therapy comments cPA L2-L5 g3-g4 in prone                      PT Short Term Goals - 11/07/20 1406       PT SHORT TERM GOAL #1   Title Patient will demonstrate independent use of home exercise program to maintain progress from in clinic treatments.    Time 3    Period Weeks    Status On-going    Target Date 11/13/20               PT Long Term Goals - 10/23/20 1340       PT LONG TERM GOAL #1   Title Patient will demonstrate/report pain at worst less than or equal to 2/10 to facilitate minimal limitation in daily activity secondary to pain symptoms.    Time 10    Period Weeks    Status New    Target Date 01/01/21      PT LONG TERM GOAL #2   Title Patient will demonstrate independent use of home exercise program to facilitate ability to maintain/progress functional gains from skilled physical therapy services.     Time 10    Period Weeks    Status New    Target Date 01/01/21      PT LONG TERM GOAL #3   Title Pt. will demonstrate FOTO outcome > or = 64% to indicated reduced disability due to condition.    Time 10    Period Weeks    Status New    Target Date 01/01/21      PT LONG TERM GOAL #4   Title Patient will demonstrate lumbar extension 100 % WFL s symptoms to facilitate upright standing, walking posture at PLOF s limitation.    Time 10    Period Weeks    Status New    Target Date 01/01/21      PT LONG TERM GOAL #5   Title Pt. will demonstrate sit to stand transfers from 18 inch chair s UE assist and good control.    Time 10    Period Weeks    Status New    Target Date 01/01/21                   Plan - 11/07/20 1358     Clinical Impression Statement Concordant symptoms c hypomobility at L3-L5 specifically but improved c manual.  Improved lumbar mobility noted after performance of manual intervention and HEP review today.  Overall presentation of hypomobility in lumbar spine contributing to functional limitations still present and benefit of continued skilled PT services indicated.    Personal Factors and Comorbidities Comorbidity 3+    Comorbidities HTN, history of dizziness, GERD, chronic kidney disease(stones),    Examination-Activity Limitations Sit;Bend;Stand;Locomotion Level;Transfers;Hygiene/Grooming    Examination-Participation Restrictions Community Activity;Driving;Interpersonal Relationship;Other   walking dogs   Stability/Clinical Decision Making Stable/Uncomplicated    Rehab Potential Good    PT Frequency 2x / week    PT Duration Other (comment)   10 weeks   PT Treatment/Interventions ADLs/Self Care Home Management;Cryotherapy;Electrical Stimulation;Iontophoresis 4mg /ml Dexamethasone;Moist Heat;Traction;Balance training;Therapeutic exercise;Therapeutic activities;Functional mobility training;Stair training;Gait training;DME Instruction;Ultrasound;Neuromuscular  re-education;Patient/family education;Passive range of motion;Spinal Manipulations;Joint Manipulations;Dry needling;Taping;Manual techniques    PT Next Visit Plan Progress lumbar mobility, balance intervention static primarily  PT Home Exercise Plan [email or web address]    Consulted and Agree with Plan of Care Patient             Patient will benefit from skilled therapeutic intervention in order to improve the following deficits and impairments:  Abnormal gait, Decreased endurance, Hypomobility, Pain, Decreased strength, Decreased activity tolerance, Decreased balance, Decreased mobility, Difficulty walking, Improper body mechanics, Impaired perceived functional ability, Postural dysfunction, Impaired flexibility, Decreased coordination, Decreased range of motion  Visit Diagnosis: Chronic low back pain without sciatica, unspecified back pain laterality  Difficulty in walking, not elsewhere classified     Problem List 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 01/18/2019   Sebaceous cyst 01/18/2019   Abdominal aortic atherosclerosis (HCC) 06/10/2017   Arthritis 06/10/2017   History of renal stone 07/25/2016   Benign prostatic hyperplasia with lower urinary tract symptoms 07/25/2016   Hypertension 02/25/2012   Renal insufficiency 02/25/2012   Gastroesophageal reflux disease without esophagitis 02/25/2012   ASHD (arteriosclerotic heart disease) 02/25/2012   History of gout 02/25/2012   History of colonic polyps 02/25/2012   Hyperlipidemia 02/19/2011   Statin intolerance 02/19/2011   Chyrel Masson, PT, DPT, OCS, ATC 11/07/20  2:18 PM    [facility] Physical Therapy [address] [place],  [place], [address] Phone: [phone]   Fax:  [phone]  Name: Chase Wright. MRN: [id number] Date of Birth: 10/04/1945

## 2020-11-14 ENCOUNTER — Other Ambulatory Visit: Payer: Self-pay

## 2020-11-14 ENCOUNTER — Encounter: Payer: Self-pay | Admitting: Rehabilitative and Restorative Service Providers"

## 2020-11-14 ENCOUNTER — Ambulatory Visit: Payer: Medicare Other | Admitting: Rehabilitative and Restorative Service Providers"

## 2020-11-14 DIAGNOSIS — R262 Difficulty in walking, not elsewhere classified: Secondary | ICD-10-CM | POA: Diagnosis not present

## 2020-11-14 DIAGNOSIS — G8929 Other chronic pain: Secondary | ICD-10-CM | POA: Diagnosis not present

## 2020-11-14 DIAGNOSIS — M545 Low back pain, unspecified: Secondary | ICD-10-CM

## 2020-11-14 NOTE — Therapy (Signed)
[facility] [address] [place], [place], [address] Phone: [phone]   Fax:  [phone]  Physical Therapy Treatment  Patient Details  Name: Chase Wright. MRN: [id number] Date of Birth: 11-25-1945 Referring Provider (PT): Dr. Sharlot Gowda MD   Encounter Date: 11/14/2020   PT End of Session - 11/14/20 1338     Visit Number 3    Number of Visits 20    Date for PT Re-Evaluation 01/01/21    Authorization Type U[employer] Medicare    Progress Note Due on Visit 10    PT Start Time 1340    PT Stop Time 1420    PT Time Calculation (min) 40 min    Activity Tolerance Patient tolerated treatment well    Behavior During Therapy [facility] for tasks assessed/performed             Past Medical History:  Diagnosis Date   ASHD (arteriosclerotic heart disease)    Chronic kidney disease    RENAL STONES   Diverticulitis    GERD (gastroesophageal reflux disease)    Hx of colonic polyps    Hypertension    Obesity    PUD (peptic ulcer disease)    Renal insufficiency     Past Surgical History:  Procedure Laterality Date   BYPASS GRAFT  08/16/1999   CARDIAC CATHETERIZATION  08/2005   NASAL ENDOSCOPY  2008   VIDEO BRONCHOSCOPY Bilateral 05/01/2018   Procedure: VIDEO BRONCHOSCOPY WITHOUT FLUORO;  Surgeon: Luciano Cutter, MD;  Location: [facility];  Service: Cardiopulmonary;  Laterality: Bilateral;    There were no vitals filed for this visit.   Subjective Assessment - 11/14/20 1342     Subjective Pt. stated feeling "like i need to learn how to walk again."  Pt. stated walking doesn't hurt at [facility] but does feel pain increase when prolonged sitting/standing.    Limitations House hold activities;Walking;Standing;Lifting    Diagnostic tests xray on back    Patient Stated Goals Reduce pain    Currently in Pain? No/denies    Pain Score 0-No pain   no pain at rest   Pain Onset More than a month ago                                [facility] Adult PT Treatment/Exercise - 11/14/20 0001       Neuro Re-ed    Neuro Re-ed Details  modified tandem stance 30 sec x 4 bilateral      Lumbar Exercises: Stretches   Lower Trunk Rotation 5 reps;Other (comment)   15 seconds x 5 bilateral     Lumbar Exercises: Aerobic   Nustep Lvl 5 10 mins      Lumbar Exercises: Standing   Other Standing Lumbar Exercises lumbar extension standing x 10      Lumbar Exercises: Seated   Other Seated Lumbar Exercises 18 inch chair sit to stand x 5      Lumbar Exercises: Supine   Bridge Other (comment)   2 x 10     Manual Therapy   Manual therapy comments cPA L2-L5 g3-g4 in prone                      PT Short Term Goals - 11/14/20 1352       PT SHORT TERM GOAL #1   Title Patient will demonstrate independent use of home exercise program to maintain progress from in  clinic treatments.    Time 3    Period Weeks    Status Achieved    Target Date 11/13/20               PT Long Term Goals - 10/23/20 1340       PT LONG TERM GOAL #1   Title Patient will demonstrate/report pain at worst less than or equal to 2/10 to facilitate minimal limitation in daily activity secondary to pain symptoms.    Time 10    Period Weeks    Status New    Target Date 01/01/21      PT LONG TERM GOAL #2   Title Patient will demonstrate independent use of home exercise program to facilitate ability to maintain/progress functional gains from skilled physical therapy services.    Time 10    Period Weeks    Status New    Target Date 01/01/21      PT LONG TERM GOAL #3   Title Pt. will demonstrate FOTO outcome > or = 64% to indicated reduced disability due to condition.    Time 10    Period Weeks    Status New    Target Date 01/01/21      PT LONG TERM GOAL #4   Title Patient will demonstrate lumbar extension 100 % WFL s symptoms to facilitate upright standing, walking posture at PLOF s limitation.     Time 10    Period Weeks    Status New    Target Date 01/01/21      PT LONG TERM GOAL #5   Title Pt. will demonstrate sit to stand transfers from 18 inch chair s UE assist and good control.    Time 10    Period Weeks    Status New    Target Date 01/01/21                   Plan - 11/14/20 1350     Clinical Impression Statement An improvement in PAIVM mobility assessment noted today with continued treatment focus on improved lumbar mobility to promote upright standing and walking posture.  Inclusion of static balance interventions to promote improved stability as previously requested by patient. Recommend continued skilled PT at this time.    Personal Factors and Comorbidities Comorbidity 3+    Comorbidities HTN, history of dizziness, GERD, chronic kidney disease(stones),    Examination-Activity Limitations Sit;Bend;Stand;Locomotion Level;Transfers;Hygiene/Grooming    Examination-Participation Restrictions Community Activity;Driving;Interpersonal Relationship;Other   walking dogs   Stability/Clinical Decision Making Stable/Uncomplicated    Rehab Potential Good    PT Frequency 2x / week    PT Duration Other (comment)   10 weeks   PT Treatment/Interventions ADLs/Self Care Home Management;Cryotherapy;Electrical Stimulation;Iontophoresis 4mg /ml Dexamethasone;Moist Heat;Traction;Balance training;Therapeutic exercise;Therapeutic activities;Functional mobility training;Stair training;Gait training;DME Instruction;Ultrasound;Neuromuscular re-education;Patient/family education;Passive range of motion;Spinal Manipulations;Joint Manipulations;Dry needling;Taping;Manual techniques    PT Next Visit Plan Progress lumbar mobility, balance intervention static continued    PT Home Exercise Plan [id number]    Consulted and Agree with Plan of Care Patient             Patient will benefit from skilled therapeutic intervention in order to improve the following deficits and impairments:  Abnormal  gait, Decreased endurance, Hypomobility, Pain, Decreased strength, Decreased activity tolerance, Decreased balance, Decreased mobility, Difficulty walking, Improper body mechanics, Impaired perceived functional ability, Postural dysfunction, Impaired flexibility, Decreased coordination, Decreased range of motion  Visit Diagnosis: Chronic low back pain without sciatica, unspecified back pain laterality  Difficulty in  walking, not elsewhere classified     Problem List 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 01/18/2019   Sebaceous cyst 01/18/2019   Abdominal aortic atherosclerosis (HCC) 06/10/2017   Arthritis 06/10/2017   History of renal stone 07/25/2016   Benign prostatic hyperplasia with lower urinary tract symptoms 07/25/2016   Hypertension 02/25/2012   Renal insufficiency 02/25/2012   Gastroesophageal reflux disease without esophagitis 02/25/2012   ASHD (arteriosclerotic heart disease) 02/25/2012   History of gout 02/25/2012   History of colonic polyps 02/25/2012   Hyperlipidemia 02/19/2011   Statin intolerance 02/19/2011   Chyrel Masson, PT, DPT, OCS, ATC 11/14/20  2:11 PM    [facility] [address] [place], [place], [address] Phone: [phone]   Fax:  [phone]  Name: Chase Wright. MRN: [id number] Date of Birth: 20-Apr-1945

## 2020-11-21 ENCOUNTER — Other Ambulatory Visit: Payer: Self-pay

## 2020-11-21 ENCOUNTER — Encounter: Payer: Self-pay | Admitting: Rehabilitative and Restorative Service Providers"

## 2020-11-21 ENCOUNTER — Ambulatory Visit: Payer: Medicare Other | Admitting: Rehabilitative and Restorative Service Providers"

## 2020-11-21 DIAGNOSIS — G8929 Other chronic pain: Secondary | ICD-10-CM | POA: Diagnosis not present

## 2020-11-21 DIAGNOSIS — M545 Low back pain, unspecified: Secondary | ICD-10-CM | POA: Diagnosis not present

## 2020-11-21 DIAGNOSIS — R262 Difficulty in walking, not elsewhere classified: Secondary | ICD-10-CM | POA: Diagnosis not present

## 2020-11-21 NOTE — Therapy (Signed)
[facility] [address] [place], [place], [address] Phone: [phone]   Fax:  [phone]  Physical Therapy Treatment  Patient Details  Name: Chase Wright. MRN: [id number] Date of Birth: 07-Feb-1946 Referring Provider (PT): Dr. Sharlot Gowda MD   Encounter Date: 11/21/2020   PT End of Session - 11/21/20 1340     Visit Number 4    Number of Visits 20    Date for PT Re-Evaluation 01/01/21    Authorization Type UHC Medicare    Progress Note Due on Visit 10    PT Start Time 1342    PT Stop Time 1421    PT Time Calculation (min) 39 min    Activity Tolerance Patient tolerated treatment well    Behavior During Therapy WFL for tasks assessed/performed             Past Medical History:  Diagnosis Date   ASHD (arteriosclerotic heart disease)    Chronic kidney disease    RENAL STONES   Diverticulitis    GERD (gastroesophageal reflux disease)    Hx of colonic polyps    Hypertension    Obesity    PUD (peptic ulcer disease)    Renal insufficiency     Past Surgical History:  Procedure Laterality Date   BYPASS GRAFT  08/16/1999   CARDIAC CATHETERIZATION  08/2005   NASAL ENDOSCOPY  2008   VIDEO BRONCHOSCOPY Bilateral 05/01/2018   Procedure: VIDEO BRONCHOSCOPY WITHOUT FLUORO;  Surgeon: Luciano Cutter, MD;  Location: [facility];  Service: Cardiopulmonary;  Laterality: Bilateral;    There were no vitals filed for this visit.   Subjective Assessment - 11/21/20 1347     Subjective Pt. indicated "he still feels like he's walking like at 75 year old at times."  Pt. stated no pain upon arrival today.  Plans to do more walking outside when not hot.    Limitations House hold activities;Walking;Standing;Lifting    Diagnostic tests xray on back    Patient Stated Goals Reduce pain    Currently in Pain? No/denies    Pain Onset More than a month ago                               [facility] Adult PT Treatment/Exercise  - 11/21/20 0001       Neuro Re-ed    Neuro Re-ed Details  lateral stepping over 6 inch cone c light hand assist x 10 each way, static balance feet together on foam 90 seconds      Lumbar Exercises: Stretches   Lower Trunk Rotation 5 reps;Other (comment)   15 sec x 5 bilateral     Lumbar Exercises: Aerobic   Nustep Lvl 5 15 mins      Lumbar Exercises: Supine   Bridge 20 reps   blue band around knees   Other Supine Lumbar Exercises blue band clam shell 2 x 10 bilateral      Manual Therapy   Manual therapy comments cPA L2-L5 g3-g4 in prone                      PT Short Term Goals - 11/14/20 1352       PT SHORT TERM GOAL #1   Title Patient will demonstrate independent use of home exercise program to maintain progress from in clinic treatments.    Time 3    Period Weeks    Status Achieved  Target Date 11/13/20               PT Long Term Goals - 11/21/20 1416       PT LONG TERM GOAL #1   Title Patient will demonstrate/report pain at worst less than or equal to 2/10 to facilitate minimal limitation in daily activity secondary to pain symptoms.    Time 10    Period Weeks    Status On-going    Target Date 01/01/21      PT LONG TERM GOAL #2   Title Patient will demonstrate independent use of home exercise program to facilitate ability to maintain/progress functional gains from skilled physical therapy services.    Time 10    Period Weeks    Status On-going    Target Date 01/01/21      PT LONG TERM GOAL #3   Title Pt. will demonstrate FOTO outcome > or = 64% to indicated reduced disability due to condition.    Time 10    Period Weeks    Status On-going    Target Date 01/01/21      PT LONG TERM GOAL #4   Title Patient will demonstrate lumbar extension 100 % WFL s symptoms to facilitate upright standing, walking posture at PLOF s limitation.    Time 10    Period Weeks    Status On-going    Target Date 01/01/21      PT LONG TERM GOAL #5   Title  Pt. will demonstrate sit to stand transfers from 18 inch chair s UE assist and good control.    Time 10    Period Weeks    Status Achieved    Target Date 01/01/21                   Plan - 11/21/20 1414     Clinical Impression Statement PAIVM assessment continued to show improvement towards normal mobility, though more sensitive to touch today in L3, L4 movement.  Pt. has demonstrated improvement in quality of progressive mobility including sit to stand and bed mobility as observed in clinic.    Personal Factors and Comorbidities Comorbidity 3+    Comorbidities HTN, history of dizziness, GERD, chronic kidney disease(stones),    Examination-Activity Limitations Sit;Bend;Stand;Locomotion Level;Transfers;Hygiene/Grooming    Examination-Participation Restrictions Community Activity;Driving;Interpersonal Relationship;Other   walking dogs   Stability/Clinical Decision Making Stable/Uncomplicated    Rehab Potential Good    PT Frequency 2x / week    PT Duration Other (comment)   10 weeks   PT Treatment/Interventions ADLs/Self Care Home Management;Cryotherapy;Electrical Stimulation;Iontophoresis 4mg /ml Dexamethasone;Moist Heat;Traction;Balance training;Therapeutic exercise;Therapeutic activities;Functional mobility training;Stair training;Gait training;DME Instruction;Ultrasound;Neuromuscular re-education;Patient/family education;Passive range of motion;Spinal Manipulations;Joint Manipulations;Dry needling;Taping;Manual techniques    PT Next Visit Plan Lumbar mobility intervention as needed, focus on balance.    PT Home Exercise Plan [id number]    Consulted and Agree with Plan of Care Patient             Patient will benefit from skilled therapeutic intervention in order to improve the following deficits and impairments:  Abnormal gait, Decreased endurance, Hypomobility, Pain, Decreased strength, Decreased activity tolerance, Decreased balance, Decreased mobility, Difficulty walking,  Improper body mechanics, Impaired perceived functional ability, Postural dysfunction, Impaired flexibility, Decreased coordination, Decreased range of motion  Visit Diagnosis: Chronic low back pain without sciatica, unspecified back pain laterality  Difficulty in walking, not elsewhere classified     Problem List 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  Diverticulosis 07/18/2020   Chest pain, rule out acute myocardial infarction 07/11/2020   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   Morbid obesity (HCC) 01/27/2020   OAB (overactive bladder) 01/27/2020   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 01/18/2019   Sebaceous cyst 01/18/2019   Abdominal aortic atherosclerosis (HCC) 06/10/2017   Arthritis 06/10/2017   History of renal stone 07/25/2016   Benign prostatic hyperplasia with lower urinary tract symptoms 07/25/2016   Hypertension 02/25/2012   Renal insufficiency 02/25/2012   Gastroesophageal reflux disease without esophagitis 02/25/2012   ASHD (arteriosclerotic heart disease) 02/25/2012   History of gout 02/25/2012   History of colonic polyps 02/25/2012   Hyperlipidemia 02/19/2011   Statin intolerance 02/19/2011   Chyrel Masson, PT, DPT, OCS, ATC 11/21/20  2:23 PM    [facility] [address] [place], [place], [address] Phone: [phone]   Fax:  [phone]  Name: Edrick Whitehorn. MRN: [id number] Date of Birth: 10/20/1945

## 2020-11-28 ENCOUNTER — Encounter: Payer: Self-pay | Admitting: Rehabilitative and Restorative Service Providers"

## 2020-11-28 ENCOUNTER — Other Ambulatory Visit: Payer: Self-pay

## 2020-11-28 ENCOUNTER — Ambulatory Visit: Payer: Medicare Other | Admitting: Rehabilitative and Restorative Service Providers"

## 2020-11-28 DIAGNOSIS — R262 Difficulty in walking, not elsewhere classified: Secondary | ICD-10-CM | POA: Diagnosis not present

## 2020-11-28 DIAGNOSIS — M545 Low back pain, unspecified: Secondary | ICD-10-CM | POA: Diagnosis not present

## 2020-11-28 DIAGNOSIS — G8929 Other chronic pain: Secondary | ICD-10-CM | POA: Diagnosis not present

## 2020-11-28 NOTE — Therapy (Signed)
[facility] [address] [place], [place], [address] Phone: [phone]   Fax:  [phone]  Physical Therapy Treatment  Patient Details  Name: Chase Wright. MRN: [id number] Date of Birth: Jan 20, 1946 Referring Provider (PT): Dr. Sharlot Gowda MD   Encounter Date: 11/28/2020   PT End of Session - 11/28/20 1347     Visit Number 5    Number of Visits 20    Date for PT Re-Evaluation 01/01/21    Authorization Type UHC Medicare    Progress Note Due on Visit 10    PT Start Time 1341    PT Stop Time 1421    PT Time Calculation (min) 40 min    Activity Tolerance Patient tolerated treatment well    Behavior During Therapy [facility] for tasks assessed/performed             Past Medical History:  Diagnosis Date   ASHD (arteriosclerotic heart disease)    Chronic kidney disease    RENAL STONES   Diverticulitis    GERD (gastroesophageal reflux disease)    Hx of colonic polyps    Hypertension    Obesity    PUD (peptic ulcer disease)    Renal insufficiency     Past Surgical History:  Procedure Laterality Date   BYPASS GRAFT  08/16/1999   CARDIAC CATHETERIZATION  08/2005   NASAL ENDOSCOPY  2008   VIDEO BRONCHOSCOPY Bilateral 05/01/2018   Procedure: VIDEO BRONCHOSCOPY WITHOUT FLUORO;  Surgeon: Luciano Cutter, MD;  Location: [facility];  Service: Cardiopulmonary;  Laterality: Bilateral;    There were no vitals filed for this visit.   Subjective Assessment - 11/28/20 1344     Subjective Pt. indicated "he still feels like he's walking like at 75 year old at times."  Pt. stated no pain upon arrival today.  Plans to do more walking outside when not hot.    Limitations House hold activities;Walking;Standing;Lifting    Diagnostic tests xray on back    Patient Stated Goals Reduce pain    Pain Score 8     Pain Location Back    Pain Orientation Lower    Pain Descriptors / Indicators Sharp;Tightness    Pain Type Chronic pain    Pain  Onset More than a month ago    Pain Frequency Intermittent    Aggravating Factors  getting up after chair sitting, early walking    Pain Relieving Factors walking and movement after initial pains.                [facility] PT Assessment - 11/28/20 0001       Assessment   Medical Diagnosis Low back pain    Referring Provider (PT) Dr. Sharlot Gowda MD    Onset Date/Surgical Date 05/09/20    Hand Dominance Right      AROM   Lumbar Extension 50% WFL c back tightness noted      Transfers   Comments difficulty in obtaining full upright posture after sit to stand transfer                           [facility] Adult PT Treatment/Exercise - 11/28/20 0001       Lumbar Exercises: Stretches   Lower Trunk Rotation 5 reps   15 sec x5 bilateral   Press Ups 10 reps      Lumbar Exercises: Aerobic   Nustep Lvl 5 15 mins      Lumbar Exercises:  Supine   Bridge 20 reps      Manual Therapy   Manual therapy comments cPA L2-L5 g3-g4 in prone                      PT Short Term Goals - 11/14/20 1352       PT SHORT TERM GOAL #1   Title Patient will demonstrate independent use of home exercise program to maintain progress from in clinic treatments.    Time 3    Period Weeks    Status Achieved    Target Date 11/13/20               PT Long Term Goals - 11/21/20 1416       PT LONG TERM GOAL #1   Title Patient will demonstrate/report pain at worst less than or equal to 2/10 to facilitate minimal limitation in daily activity secondary to pain symptoms.    Time 10    Period Weeks    Status On-going    Target Date 01/01/21      PT LONG TERM GOAL #2   Title Patient will demonstrate independent use of home exercise program to facilitate ability to maintain/progress functional gains from skilled physical therapy services.    Time 10    Period Weeks    Status On-going    Target Date 01/01/21      PT LONG TERM GOAL #3   Title Pt. will demonstrate FOTO outcome  > or = 64% to indicated reduced disability due to condition.    Time 10    Period Weeks    Status On-going    Target Date 01/01/21      PT LONG TERM GOAL #4   Title Patient will demonstrate lumbar extension 100 % WFL s symptoms to facilitate upright standing, walking posture at PLOF s limitation.    Time 10    Period Weeks    Status On-going    Target Date 01/01/21      PT LONG TERM GOAL #5   Title Pt. will demonstrate sit to stand transfers from 18 inch chair s UE assist and good control.    Time 10    Period Weeks    Status Achieved    Target Date 01/01/21                   Plan - 11/28/20 1415     Clinical Impression Statement Quality of movement decreased today compared to last few visits c more noted difficulty.  Upon reassessment, lumbar extension mobility has gained from evaluation but can continue to improve to facilitate improved ease with transfers and initial standing.  Continued skilled PT services indicated at this time.    Personal Factors and Comorbidities Comorbidity 3+    Comorbidities HTN, history of dizziness, GERD, chronic kidney disease(stones),    Examination-Activity Limitations Sit;Bend;Stand;Locomotion Level;Transfers;Hygiene/Grooming    Examination-Participation Restrictions Community Activity;Driving;Interpersonal Relationship;Other   walking dogs   Stability/Clinical Decision Making Stable/Uncomplicated    Rehab Potential Good    PT Frequency 2x / week    PT Duration Other (comment)   10 weeks   PT Treatment/Interventions ADLs/Self Care Home Management;Cryotherapy;Electrical Stimulation;Iontophoresis 4mg /ml Dexamethasone;Moist Heat;Traction;Balance training;Therapeutic exercise;Therapeutic activities;Functional mobility training;Stair training;Gait training;DME Instruction;Ultrasound;Neuromuscular re-education;Patient/family education;Passive range of motion;Spinal Manipulations;Joint Manipulations;Dry needling;Taping;Manual techniques    PT Next  Visit Plan PAIVM, lumbar mobility gains.    PT Home Exercise Plan [id number]    Consulted and Agree with Plan of Care Patient  Patient will benefit from skilled therapeutic intervention in order to improve the following deficits and impairments:  Abnormal gait, Decreased endurance, Hypomobility, Pain, Decreased strength, Decreased activity tolerance, Decreased balance, Decreased mobility, Difficulty walking, Improper body mechanics, Impaired perceived functional ability, Postural dysfunction, Impaired flexibility, Decreased coordination, Decreased range of motion  Visit Diagnosis: Chronic low back pain without sciatica, unspecified back pain laterality  Difficulty in walking, not elsewhere classified     Problem List 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 01/18/2019   Sebaceous cyst 01/18/2019   Abdominal aortic atherosclerosis (HCC) 06/10/2017   Arthritis 06/10/2017   History of renal stone 07/25/2016   Benign prostatic hyperplasia with lower urinary tract symptoms 07/25/2016   Hypertension 02/25/2012   Renal insufficiency 02/25/2012   Gastroesophageal reflux disease without esophagitis 02/25/2012   ASHD (arteriosclerotic heart disease) 02/25/2012   History of gout 02/25/2012   History of colonic polyps 02/25/2012   Hyperlipidemia 02/19/2011   Statin intolerance 02/19/2011   Chyrel Masson, PT, DPT, OCS, ATC 11/28/20  2:17 PM    [facility] Therapy [address] [place], [place], [address] Phone: [phone]   Fax:  [phone]  Name: Carolyn Maniscalco. MRN: [id number] Date of Birth: May 01, 1945

## 2020-12-05 ENCOUNTER — Ambulatory Visit: Payer: Medicare Other | Admitting: Rehabilitative and Restorative Service Providers"

## 2020-12-05 ENCOUNTER — Other Ambulatory Visit: Payer: Self-pay

## 2020-12-05 ENCOUNTER — Encounter: Payer: Self-pay | Admitting: Rehabilitative and Restorative Service Providers"

## 2020-12-05 DIAGNOSIS — G8929 Other chronic pain: Secondary | ICD-10-CM

## 2020-12-05 DIAGNOSIS — R262 Difficulty in walking, not elsewhere classified: Secondary | ICD-10-CM | POA: Diagnosis not present

## 2020-12-05 DIAGNOSIS — M545 Low back pain, unspecified: Secondary | ICD-10-CM | POA: Diagnosis not present

## 2020-12-05 NOTE — Therapy (Signed)
[facility] [address] [place], [place], [address] Phone: [phone]   Fax:  [phone]  Physical Therapy Treatment  Patient Details  Name: Chase Wright. MRN: [id number] Date of Birth: Mar 23, 1946 Referring Provider (PT): Dr. Sharlot Gowda MD   Encounter Date: 12/05/2020   PT End of Session - 12/05/20 1418     Visit Number 6    Number of Visits 20    Date for PT Re-Evaluation 01/01/21    Authorization Type UHC Medicare    Progress Note Due on Visit 10    PT Start Time 1425    PT Stop Time 1504    PT Time Calculation (min) 39 min    Activity Tolerance Patient tolerated treatment well    Behavior During Therapy [facility] for tasks assessed/performed             Past Medical History:  Diagnosis Date   ASHD (arteriosclerotic heart disease)    Chronic kidney disease    RENAL STONES   Diverticulitis    GERD (gastroesophageal reflux disease)    Hx of colonic polyps    Hypertension    Obesity    PUD (peptic ulcer disease)    Renal insufficiency     Past Surgical History:  Procedure Laterality Date   BYPASS GRAFT  08/16/1999   CARDIAC CATHETERIZATION  08/2005   NASAL ENDOSCOPY  2008   VIDEO BRONCHOSCOPY Bilateral 05/01/2018   Procedure: VIDEO BRONCHOSCOPY WITHOUT FLUORO;  Surgeon: Luciano Cutter, MD;  Location: [facility];  Service: Cardiopulmonary;  Laterality: Bilateral;    There were no vitals filed for this visit.   Subjective Assessment - 12/05/20 1431     Subjective Pt. indicated feeling short term reliefs from HEP still but has had pain in back in the last week that has been more than the previous weeks where he noticed some improvement.    Limitations House hold activities;Walking;Standing;Lifting    Diagnostic tests xray on back    Patient Stated Goals Reduce pain    Pain Score 7     Pain Location Back    Pain Orientation Lower    Pain Descriptors / Indicators Sharp;Tightness    Pain Type Chronic pain     Pain Onset More than a month ago    Pain Frequency Intermittent    Aggravating Factors  standing/walking    Pain Relieving Factors exercises do help in short term gains                               [facility] Adult PT Treatment/Exercise - 12/05/20 0001       Self-Care   Self-Care Other Self-Care Comments    Other Self-Care Comments  Self care instruction for HEP routine use, cues for use prn for symptom relief and overall reducing static positioning time      Lumbar Exercises: Stretches   Lower Trunk Rotation 5 reps   15 sec x 5 bilateral     Lumbar Exercises: Aerobic   Nustep Lvl 6 15 mins      Lumbar Exercises: Standing   Other Standing Lumbar Exercises standing lumbar extension x 10      Lumbar Exercises: Supine   Bridge 10 reps                      PT Short Term Goals - 11/14/20 1352       PT SHORT TERM GOAL #  1   Title Patient will demonstrate independent use of home exercise program to maintain progress from in clinic treatments.    Time 3    Period Weeks    Status Achieved    Target Date 11/13/20               PT Long Term Goals - 11/21/20 1416       PT LONG TERM GOAL #1   Title Patient will demonstrate/report pain at worst less than or equal to 2/10 to facilitate minimal limitation in daily activity secondary to pain symptoms.    Time 10    Period Weeks    Status On-going    Target Date 01/01/21      PT LONG TERM GOAL #2   Title Patient will demonstrate independent use of home exercise program to facilitate ability to maintain/progress functional gains from skilled physical therapy services.    Time 10    Period Weeks    Status On-going    Target Date 01/01/21      PT LONG TERM GOAL #3   Title Pt. will demonstrate FOTO outcome > or = 64% to indicated reduced disability due to condition.    Time 10    Period Weeks    Status On-going    Target Date 01/01/21      PT LONG TERM GOAL #4   Title Patient will demonstrate  lumbar extension 100 % WFL s symptoms to facilitate upright standing, walking posture at PLOF s limitation.    Time 10    Period Weeks    Status On-going    Target Date 01/01/21      PT LONG TERM GOAL #5   Title Pt. will demonstrate sit to stand transfers from 18 inch chair s UE assist and good control.    Time 10    Period Weeks    Status Achieved    Target Date 01/01/21                   Plan - 12/05/20 1447     Clinical Impression Statement Pt. reported a return of symptom presentation in last week.  HEP and mobility still does contribute to short term relief as well as improved mobility to lumbar region which aides functional progressive mobility.  Reviewed HEP usage and consistency today for symptom relief tactics.  Held manual intervention today to avoid any post manual soreness to lumbar region for reassessment next visit. Continued skilled PT services indicated at this time.    Personal Factors and Comorbidities Comorbidity 3+    Comorbidities HTN, history of dizziness, GERD, chronic kidney disease(stones),    Examination-Activity Limitations Sit;Bend;Stand;Locomotion Level;Transfers;Hygiene/Grooming    Examination-Participation Restrictions Community Activity;Driving;Interpersonal Relationship;Other   walking dogs   Stability/Clinical Decision Making Stable/Uncomplicated    Rehab Potential Good    PT Frequency 2x / week    PT Duration Other (comment)   10 weeks   PT Treatment/Interventions ADLs/Self Care Home Management;Cryotherapy;Electrical Stimulation;Iontophoresis 4mg /ml Dexamethasone;Moist Heat;Traction;Balance training;Therapeutic exercise;Therapeutic activities;Functional mobility training;Stair training;Gait training;DME Instruction;Ultrasound;Neuromuscular re-education;Patient/family education;Passive range of motion;Spinal Manipulations;Joint Manipulations;Dry needling;Taping;Manual techniques    PT Next Visit Plan Reassess mobility objective data next visit,  possible FOTO analysis.    PT Home Exercise Plan [id number]    Consulted and Agree with Plan of Care Patient             Patient will benefit from skilled therapeutic intervention in order to improve the following deficits and impairments:  Abnormal gait, Decreased endurance,  Hypomobility, Pain, Decreased strength, Decreased activity tolerance, Decreased balance, Decreased mobility, Difficulty walking, Improper body mechanics, Impaired perceived functional ability, Postural dysfunction, Impaired flexibility, Decreased coordination, Decreased range of motion  Visit Diagnosis: Chronic low back pain without sciatica, unspecified back pain laterality  Difficulty in walking, not elsewhere classified     Problem List 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 01/18/2019   Sebaceous cyst 01/18/2019   Abdominal aortic atherosclerosis (HCC) 06/10/2017   Arthritis 06/10/2017   History of renal stone 07/25/2016   Benign prostatic hyperplasia with lower urinary tract symptoms 07/25/2016   Hypertension 02/25/2012   Renal insufficiency 02/25/2012   Gastroesophageal reflux disease without esophagitis 02/25/2012   ASHD (arteriosclerotic heart disease) 02/25/2012   History of gout 02/25/2012   History of colonic polyps 02/25/2012   Hyperlipidemia 02/19/2011   Statin intolerance 02/19/2011    Chyrel Masson, PT, DPT, OCS, ATC 12/05/20  2:57 PM    [facility] [address] [place], [place], [address] Phone: [phone]   Fax:  [phone]  Name: Chase Wright. MRN: [id number] Date of Birth: 04-Oct-1945

## 2020-12-14 ENCOUNTER — Other Ambulatory Visit: Payer: Self-pay

## 2020-12-14 ENCOUNTER — Encounter: Payer: Self-pay | Admitting: Rehabilitative and Restorative Service Providers"

## 2020-12-14 ENCOUNTER — Ambulatory Visit: Payer: Medicare Other | Admitting: Rehabilitative and Restorative Service Providers"

## 2020-12-14 DIAGNOSIS — R262 Difficulty in walking, not elsewhere classified: Secondary | ICD-10-CM

## 2020-12-14 DIAGNOSIS — M545 Low back pain, unspecified: Secondary | ICD-10-CM | POA: Diagnosis not present

## 2020-12-14 DIAGNOSIS — G8929 Other chronic pain: Secondary | ICD-10-CM | POA: Diagnosis not present

## 2020-12-14 NOTE — Therapy (Signed)
[facility] [address] [place], [place], [address] Phone: [phone]   Fax:  [phone]  Physical Therapy Treatment  Patient Details  Name: Chase Wright. MRN: [id number] Date of Birth: January 04, 1946 Referring Provider (PT): Dr. Sharlot Gowda MD   Encounter Date: 12/14/2020   PT End of Session - 12/14/20 1332     Visit Number 7    Number of Visits 20    Date for PT Re-Evaluation 01/01/21    Authorization Type UHC Medicare    Progress Note Due on Visit 10    PT Start Time 1334    PT Stop Time 1413    PT Time Calculation (min) 39 min    Activity Tolerance Patient tolerated treatment well    Behavior During Therapy [facility] for tasks assessed/performed             Past Medical History:  Diagnosis Date   ASHD (arteriosclerotic heart disease)    Chronic kidney disease    RENAL STONES   Diverticulitis    GERD (gastroesophageal reflux disease)    Hx of colonic polyps    Hypertension    Obesity    PUD (peptic ulcer disease)    Renal insufficiency     Past Surgical History:  Procedure Laterality Date   BYPASS GRAFT  08/16/1999   CARDIAC CATHETERIZATION  08/2005   NASAL ENDOSCOPY  2008   VIDEO BRONCHOSCOPY Bilateral 05/01/2018   Procedure: VIDEO BRONCHOSCOPY WITHOUT FLUORO;  Surgeon: Luciano Cutter, MD;  Location: [facility];  Service: Cardiopulmonary;  Laterality: Bilateral;    There were no vitals filed for this visit.   Subjective Assessment - 12/14/20 1338     Subjective Pt. indicated he had better weekend vs. the previous week.  Pt. stated he thinks the bed is part of it.  Pt. indicated sleeping in the recliner can be helpful but can't get to sleep was quick.    Limitations House hold activities;Walking;Standing;Lifting    Diagnostic tests xray on back    Patient Stated Goals Reduce pain    Pain Score 5     Pain Location Back    Pain Orientation Lower    Pain Descriptors / Indicators Sharp;Tightness    Pain Type  Chronic pain    Pain Onset More than a month ago    Aggravating Factors  stnading walking after sitting, nighttime    Pain Relieving Factors movement seems to help                [facility] PT Assessment - 12/14/20 0001       Assessment   Medical Diagnosis Low back pain    Referring Provider (PT) Dr. Sharlot Gowda MD    Onset Date/Surgical Date 05/09/20    Hand Dominance Right      Observation/Other Assessments   Focus on Therapeutic Outcomes (FOTO)  update 51 (predicted was 54 not 64)      AROM   Lumbar Extension 50 %      Transfers   Comments able to perform 18 inch s UE assist on first try                           [facility] Adult PT Treatment/Exercise - 12/14/20 0001       Lumbar Exercises: Aerobic   Nustep Lvl 6 15 mins      Lumbar Exercises: Standing   Row Both   2 x 10 green band, postural cues  for upright positioning   Theraband Level (Row) Level 3 (Green)    Shoulder Extension Both   2 x 10   Theraband Level (Shoulder Extension) Level 3 (Green)    Other Standing Lumbar Exercises lumbar extension at wall 2 x 10      Lumbar Exercises: Seated   Other Seated Lumbar Exercises 18 inch table sit to stand x 15 (cues for techniques for bowing forward in sitting)                       PT Short Term Goals - 11/14/20 1352       PT SHORT TERM GOAL #1   Title Patient will demonstrate independent use of home exercise program to maintain progress from in clinic treatments.    Time 3    Period Weeks    Status Achieved    Target Date 11/13/20               PT Long Term Goals - 11/21/20 1416       PT LONG TERM GOAL #1   Title Patient will demonstrate/report pain at worst less than or equal to 2/10 to facilitate minimal limitation in daily activity secondary to pain symptoms.    Time 10    Period Weeks    Status On-going    Target Date 01/01/21      PT LONG TERM GOAL #2   Title Patient will demonstrate independent use of home  exercise program to facilitate ability to maintain/progress functional gains from skilled physical therapy services.    Time 10    Period Weeks    Status On-going    Target Date 01/01/21      PT LONG TERM GOAL #3   Title Pt. will demonstrate FOTO outcome > or = 64% to indicated reduced disability due to condition.    Time 10    Period Weeks    Status On-going    Target Date 01/01/21      PT LONG TERM GOAL #4   Title Patient will demonstrate lumbar extension 100 % WFL s symptoms to facilitate upright standing, walking posture at PLOF s limitation.    Time 10    Period Weeks    Status On-going    Target Date 01/01/21      PT LONG TERM GOAL #5   Title Pt. will demonstrate sit to stand transfers from 18 inch chair s UE assist and good control.    Time 10    Period Weeks    Status Achieved    Target Date 01/01/21                   Plan - 12/14/20 1359     Clinical Impression Statement FOTO reassessment today showed marked improvement compared to evaluation scoring.  Continued emphasis for in clinic education and ther ex/HEP use for improved mobility and reducing static positioning to encourage management of symptoms.  Gains have been noted but continued improvement desired at this time.    Personal Factors and Comorbidities Comorbidity 3+    Comorbidities HTN, history of dizziness, GERD, chronic kidney disease(stones),    Examination-Activity Limitations Sit;Bend;Stand;Locomotion Level;Transfers;Hygiene/Grooming    Examination-Participation Restrictions Community Activity;Driving;Interpersonal Relationship;Other   walking dogs   Stability/Clinical Decision Making Stable/Uncomplicated    Rehab Potential Good    PT Frequency 2x / week    PT Duration Other (comment)   10 weeks   PT Treatment/Interventions ADLs/Self Care Home Management;Cryotherapy;Electrical Stimulation;Iontophoresis 4mg /ml  Dexamethasone;Moist Heat;Traction;Balance training;Therapeutic exercise;Therapeutic  activities;Functional mobility training;Stair training;Gait training;DME Instruction;Ultrasound;Neuromuscular re-education;Patient/family education;Passive range of motion;Spinal Manipulations;Joint Manipulations;Dry needling;Taping;Manual techniques    PT Next Visit Plan Follow up on routineness of HEP, continued lumbar extension mobility gains, LE strengthening    PT Home Exercise Plan [id number]    Consulted and Agree with Plan of Care Patient             Patient will benefit from skilled therapeutic intervention in order to improve the following deficits and impairments:  Abnormal gait, Decreased endurance, Hypomobility, Pain, Decreased strength, Decreased activity tolerance, Decreased balance, Decreased mobility, Difficulty walking, Improper body mechanics, Impaired perceived functional ability, Postural dysfunction, Impaired flexibility, Decreased coordination, Decreased range of motion  Visit Diagnosis: Chronic low back pain without sciatica, unspecified back pain laterality  Difficulty in walking, not elsewhere classified     Problem List 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 01/18/2019   Sebaceous cyst 01/18/2019   Abdominal aortic atherosclerosis (HCC) 06/10/2017   Arthritis 06/10/2017   History of renal stone 07/25/2016   Benign prostatic hyperplasia with lower urinary tract symptoms 07/25/2016   Hypertension 02/25/2012   Renal insufficiency 02/25/2012   Gastroesophageal reflux disease without esophagitis 02/25/2012   ASHD (arteriosclerotic heart disease) 02/25/2012   History of gout 02/25/2012   History of colonic polyps 02/25/2012   Hyperlipidemia 02/19/2011   Statin intolerance 02/19/2011    Chyrel Masson, PT, DPT, OCS, ATC 12/14/20  2:09 PM    [facility] [address] [place], [place], [address] Phone: [phone]   Fax:  [phone]  Name: Chase Wright. MRN: [id number] Date of Birth: 08/19/1945

## 2020-12-19 ENCOUNTER — Other Ambulatory Visit: Payer: Self-pay

## 2020-12-19 ENCOUNTER — Ambulatory Visit: Payer: Medicare Other | Admitting: Rehabilitative and Restorative Service Providers"

## 2020-12-19 ENCOUNTER — Encounter: Payer: Self-pay | Admitting: Rehabilitative and Restorative Service Providers"

## 2020-12-19 DIAGNOSIS — G8929 Other chronic pain: Secondary | ICD-10-CM

## 2020-12-19 DIAGNOSIS — M545 Low back pain, unspecified: Secondary | ICD-10-CM

## 2020-12-19 DIAGNOSIS — R262 Difficulty in walking, not elsewhere classified: Secondary | ICD-10-CM

## 2020-12-19 NOTE — Therapy (Signed)
[facility] [address] [place], [place], [address] Phone: [phone]   Fax:  [phone]  Physical Therapy Treatment  Patient Details  Name: Chase Wright. MRN: [id number] Date of Birth: 12-17-1945 Referring Provider (PT): Dr. Sharlot Gowda MD   Encounter Date: 12/19/2020   PT End of Session - 12/19/20 1344     Visit Number 8    Number of Visits 20    Date for PT Re-Evaluation 01/01/21    Authorization Type UHC Medicare    Progress Note Due on Visit 10    PT Start Time 1344    PT Stop Time 1424    PT Time Calculation (min) 40 min    Activity Tolerance Patient tolerated treatment well    Behavior During Therapy [facility] for tasks assessed/performed             Past Medical History:  Diagnosis Date   ASHD (arteriosclerotic heart disease)    Chronic kidney disease    RENAL STONES   Diverticulitis    GERD (gastroesophageal reflux disease)    Hx of colonic polyps    Hypertension    Obesity    PUD (peptic ulcer disease)    Renal insufficiency     Past Surgical History:  Procedure Laterality Date   BYPASS GRAFT  08/16/1999   CARDIAC CATHETERIZATION  08/2005   NASAL ENDOSCOPY  2008   VIDEO BRONCHOSCOPY Bilateral 05/01/2018   Procedure: VIDEO BRONCHOSCOPY WITHOUT FLUORO;  Surgeon: [facility] Cutter, MD;  Location: [facility];  Service: Cardiopulmonary;  Laterality: Bilateral;    There were no vitals filed for this visit.   Subjective Assessment - 12/19/20 1350     Subjective Pt. indicated 7/10 pain in back when standing from chair in waiting room.  Pt. stated less pain overweekend and after walking around some prior to sitting in waiting room.  Pt. indicated getting out of ride to clinic was troublesome today and he almost fell.  Pt. indicated he went walking outside some and it didn't hurt today.    Limitations House hold activities;Walking;Standing;Lifting    Diagnostic tests xray on back    Patient Stated Goals Reduce  pain    Currently in Pain? Yes    Pain Score 7    quick pain upon standing today   Pain Descriptors / Indicators Sharp    Pain Type Chronic pain    Pain Onset More than a month ago    Aggravating Factors  transfer after sitting prolonged, prolonged standing    Pain Relieving Factors movement, some walking                               [facility] Adult PT Treatment/Exercise - 12/19/20 0001       Neuro Re-ed    Neuro Re-ed Details  tandem modified 1 min x 1 bilateral      Lumbar Exercises: Stretches   Lower Trunk Rotation 5 reps   15 sec x 5 bilateral     Lumbar Exercises: Aerobic   Nustep Lvl 6 10 mins UE/LE      Lumbar Exercises: Standing   Other Standing Lumbar Exercises lumbar extension 3 x 5      Lumbar Exercises: Seated   Other Seated Lumbar Exercises 18 inch table sit to stand x 10      Lumbar Exercises: Supine   Bridge Other (comment)   2 x 10 2-3 second hold  Other Supine Lumbar Exercises blue band clam shell 2 x 10 bilateral                       PT Short Term Goals - 11/14/20 1352       PT SHORT TERM GOAL #1   Title Patient will demonstrate independent use of home exercise program to maintain progress from in clinic treatments.    Time 3    Period Weeks    Status Achieved    Target Date 11/13/20               PT Long Term Goals - 12/19/20 1417       PT LONG TERM GOAL #1   Title Patient will demonstrate/report pain at worst less than or equal to 2/10 to facilitate minimal limitation in daily activity secondary to pain symptoms.    Time 10    Period Weeks    Status On-going    Target Date 01/01/21      PT LONG TERM GOAL #2   Title Patient will demonstrate independent use of home exercise program to facilitate ability to maintain/progress functional gains from skilled physical therapy services.    Time 10    Period Weeks    Status On-going    Target Date 01/01/21      PT LONG TERM GOAL #3   Title Pt. will  demonstrate FOTO outcome > or = 64% to indicated reduced disability due to condition.    Time 10    Period Weeks    Status On-going    Target Date 01/01/21      PT LONG TERM GOAL #4   Title Patient will demonstrate lumbar extension 100 % WFL s symptoms to facilitate upright standing, walking posture at PLOF s limitation.    Time 10    Period Weeks    Status On-going    Target Date 01/01/21      PT LONG TERM GOAL #5   Title Pt. will demonstrate sit to stand transfers from 18 inch chair s UE assist and good control.    Time 10    Period Weeks    Status Achieved    Target Date 01/01/21                   Plan - 12/19/20 1416     Clinical Impression Statement Pt. again arrived c symptoms noted, particularly after ride to clinic and reported almost falling out of vechicle.  After intervention and mobility in clinic, marked improvement in symptom presentation noted.  Continued emphasis given on routine HEP use as well as limiting static positoining time to promote improved management in home environment.    Personal Factors and Comorbidities Comorbidity 3+    Comorbidities HTN, history of dizziness, GERD, chronic kidney disease(stones),    Examination-Activity Limitations Sit;Bend;Stand;Locomotion Level;Transfers;Hygiene/Grooming    Examination-Participation Restrictions Community Activity;Driving;Interpersonal Relationship;Other   walking dogs   Stability/Clinical Decision Making Stable/Uncomplicated    Rehab Potential Good    PT Frequency 2x / week    PT Duration Other (comment)   10 weeks   PT Treatment/Interventions ADLs/Self Care Home Management;Cryotherapy;Electrical Stimulation;Iontophoresis 4mg /ml Dexamethasone;Moist Heat;Traction;Balance training;Therapeutic exercise;Therapeutic activities;Functional mobility training;Stair training;Gait training;DME Instruction;Ultrasound;Neuromuscular re-education;Patient/family education;Passive range of motion;Spinal  Manipulations;Joint Manipulations;Dry needling;Taping;Manual techniques    PT Next Visit Plan Continue lumbar and hip mobility, strengthening, balance improvements.    PT Home Exercise Plan [id number]    Consulted and Agree with Plan of Care Patient  Patient will benefit from skilled therapeutic intervention in order to improve the following deficits and impairments:  Abnormal gait, Decreased endurance, Hypomobility, Pain, Decreased strength, Decreased activity tolerance, Decreased balance, Decreased mobility, Difficulty walking, Improper body mechanics, Impaired perceived functional ability, Postural dysfunction, Impaired flexibility, Decreased coordination, Decreased range of motion  Visit Diagnosis: Chronic low back pain without sciatica, unspecified back pain laterality  Difficulty in walking, not elsewhere classified     Problem List 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 01/18/2019   Sebaceous cyst 01/18/2019   Abdominal aortic atherosclerosis (HCC) 06/10/2017   Arthritis 06/10/2017   History of renal stone 07/25/2016   Benign prostatic hyperplasia with lower urinary tract symptoms 07/25/2016   Hypertension 02/25/2012   Renal insufficiency 02/25/2012   Gastroesophageal reflux disease without esophagitis 02/25/2012   ASHD (arteriosclerotic heart disease) 02/25/2012   History of gout 02/25/2012   History of colonic polyps 02/25/2012   Hyperlipidemia 02/19/2011   Statin intolerance 02/19/2011    Chyrel Masson, PT, DPT, OCS, ATC 12/19/20  2:18 PM   [facility] [address] [place], [place], [address] Phone: [phone]   Fax:   [phone]  Name: Chase Wright. MRN: [id number] Date of Birth: 03/22/1946

## 2020-12-26 ENCOUNTER — Ambulatory Visit: Payer: Medicare Other | Admitting: Rehabilitative and Restorative Service Providers"

## 2020-12-26 ENCOUNTER — Encounter: Payer: Self-pay | Admitting: Rehabilitative and Restorative Service Providers"

## 2020-12-26 ENCOUNTER — Other Ambulatory Visit: Payer: Self-pay

## 2020-12-26 DIAGNOSIS — G8929 Other chronic pain: Secondary | ICD-10-CM | POA: Diagnosis not present

## 2020-12-26 DIAGNOSIS — M545 Low back pain, unspecified: Secondary | ICD-10-CM

## 2020-12-26 DIAGNOSIS — R262 Difficulty in walking, not elsewhere classified: Secondary | ICD-10-CM | POA: Diagnosis not present

## 2020-12-26 NOTE — Therapy (Signed)
[facility] Physical Therapy [address] [place], [place], [address] Phone: [phone]   Fax:  [phone]  Physical Therapy Treatment  Patient Details  Name: Chase Wright. MRN: [id number] Date of Birth: 06/04/1945 Referring Provider (PT): Dr. Sharlot Gowda MD   Encounter Date: 12/26/2020   PT End of Session - 12/26/20 1352     Visit Number 9    Number of Visits 20    Date for PT Re-Evaluation 01/01/21    Authorization Type U[employer] Medicare    Progress Note Due on Visit 10    PT Start Time 1341    PT Stop Time 1420    PT Time Calculation (min) 39 min    Activity Tolerance Patient tolerated treatment well    Behavior During Therapy [facility] for tasks assessed/performed             Past Medical History:  Diagnosis Date   ASHD (arteriosclerotic heart disease)    Chronic kidney disease    RENAL STONES   Diverticulitis    GERD (gastroesophageal reflux disease)    Hx of colonic polyps    Hypertension    Obesity    PUD (peptic ulcer disease)    Renal insufficiency     Past Surgical History:  Procedure Laterality Date   BYPASS GRAFT  08/16/1999   CARDIAC CATHETERIZATION  08/2005   NASAL ENDOSCOPY  2008   VIDEO BRONCHOSCOPY Bilateral 05/01/2018   Procedure: VIDEO BRONCHOSCOPY WITHOUT FLUORO;  Surgeon: Luciano Cutter, MD;  Location: [facility];  Service: Cardiopulmonary;  Laterality: Bilateral;    There were no vitals filed for this visit.   Subjective Assessment - 12/26/20 1346     Subjective Pt. stated off and on trouble c insidious grabs of pains at times.  Briefly mentioned about injections today.    Limitations House hold activities;Walking;Standing;Lifting    Diagnostic tests xray on back    Patient Stated Goals Reduce pain    Currently in Pain? No/denies   no pain at rest   Pain Onset More than a month ago                [facility] PT Assessment - 12/26/20 0001       Assessment   Medical Diagnosis Low back pain    Referring  Provider (PT) Dr. Sharlot Gowda MD    Onset Date/Surgical Date 05/09/20    Hand Dominance Right      AROM   Lumbar Extension 50% upon arrival, 75% after manual      Palpation   Palpation comment passive accessory intervertebral movement limited throughout lumbar c mild discomfort noted                           [facility] Adult PT Treatment/Exercise - 12/26/20 0001       Lumbar Exercises: Stretches   Lower Trunk Rotation 5 reps   15 sec x 5 bilateral     Lumbar Exercises: Standing   Other Standing Lumbar Exercises standing lumbar extension 2 x 10      Lumbar Exercises: Supine   Bridge Other (comment)   2 x 10     Manual Therapy   Manual therapy comments cPA L1-L5 g3-g4 in prone                       PT Short Term Goals - 11/14/20 1352       PT SHORT TERM GOAL #1  Title Patient will demonstrate independent use of home exercise program to maintain progress from in clinic treatments.    Time 3    Period Weeks    Status Achieved    Target Date 11/13/20               PT Long Term Goals - 12/19/20 1417       PT LONG TERM GOAL #1   Title Patient will demonstrate/report pain at worst less than or equal to 2/10 to facilitate minimal limitation in daily activity secondary to pain symptoms.    Time 10    Period Weeks    Status On-going    Target Date 01/01/21      PT LONG TERM GOAL #2   Title Patient will demonstrate independent use of home exercise program to facilitate ability to maintain/progress functional gains from skilled physical therapy services.    Time 10    Period Weeks    Status On-going    Target Date 01/01/21      PT LONG TERM GOAL #3   Title Pt. will demonstrate FOTO outcome > or = 64% to indicated reduced disability due to condition.    Time 10    Period Weeks    Status On-going    Target Date 01/01/21      PT LONG TERM GOAL #4   Title Patient will demonstrate lumbar extension 100 % WFL s symptoms to facilitate  upright standing, walking posture at PLOF s limitation.    Time 10    Period Weeks    Status On-going    Target Date 01/01/21      PT LONG TERM GOAL #5   Title Pt. will demonstrate sit to stand transfers from 18 inch chair s UE assist and good control.    Time 10    Period Weeks    Status Achieved    Target Date 01/01/21                   Plan - 12/26/20 1408     Clinical Impression Statement Pt. continued to present c low back pain c mobility restriction noted that impair his ability to perform progressive mobility, particularily after static positioning.  Pain and mobility restriction negatively impacts his control and balance in those initial movements.   Mobility improved c PAIVM interventions to promote mobility gains.  Discussion c Pt. about possibility of discussing c MD about further inclusion of additional treatments (possible injection for example).  Pt. seemed to be hesitant about medicine use.    Personal Factors and Comorbidities Comorbidity 3+    Comorbidities HTN, history of dizziness, GERD, chronic kidney disease(stones),    Examination-Activity Limitations Sit;Bend;Stand;Locomotion Level;Transfers;Hygiene/Grooming    Examination-Participation Restrictions Community Activity;Driving;Interpersonal Relationship;Other   walking dogs   Stability/Clinical Decision Making Stable/Uncomplicated    Rehab Potential Good    PT Frequency 2x / week    PT Duration Other (comment)   10 weeks   PT Treatment/Interventions ADLs/Self Care Home Management;Cryotherapy;Electrical Stimulation;Iontophoresis 4mg /ml Dexamethasone;Moist Heat;Traction;Balance training;Therapeutic exercise;Therapeutic activities;Functional mobility training;Stair training;Gait training;DME Instruction;Ultrasound;Neuromuscular re-education;Patient/family education;Passive range of motion;Spinal Manipulations;Joint Manipulations;Dry needling;Taping;Manual techniques    PT Next Visit Plan Continue lumbar and hip  mobility, strengthening, balance improvements.  10th visit PN    PT Home Exercise Plan [id number]    Consulted and Agree with Plan of Care Patient             Patient will benefit from skilled therapeutic intervention in order to improve the following  deficits and impairments:  Abnormal gait, Decreased endurance, Hypomobility, Pain, Decreased strength, Decreased activity tolerance, Decreased balance, Decreased mobility, Difficulty walking, Improper body mechanics, Impaired perceived functional ability, Postural dysfunction, Impaired flexibility, Decreased coordination, Decreased range of motion  Visit Diagnosis: Chronic low back pain without sciatica, unspecified back pain laterality  Difficulty in walking, not elsewhere classified     Problem List 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 01/18/2019   Sebaceous cyst 01/18/2019   Abdominal aortic atherosclerosis (HCC) 06/10/2017   Arthritis 06/10/2017   History of renal stone 07/25/2016   Benign prostatic hyperplasia with lower urinary tract symptoms 07/25/2016   Hypertension 02/25/2012   Renal insufficiency 02/25/2012   Gastroesophageal reflux disease without esophagitis 02/25/2012   ASHD (arteriosclerotic heart disease) 02/25/2012   History of gout 02/25/2012   History of colonic polyps 02/25/2012   Hyperlipidemia 02/19/2011   Statin intolerance 02/19/2011   Chyrel Masson, PT, DPT, OCS, ATC 12/26/20  2:12 PM   [facility] Physical Therapy [address] [place], [place], [address] Phone: [phone]   Fax:  [phone]  Name: Chase Wright. MRN: [id number] Date of Birth: 01-23-1946

## 2021-01-02 ENCOUNTER — Telehealth: Payer: Self-pay

## 2021-01-02 NOTE — Telephone Encounter (Signed)
Pt called to request a referral to Dr. Ezzard Standing. He goes to ortho for P/T and a muscle relaxer for pain. Pain is back pain and neck area. Please advise [facility]

## 2021-01-03 ENCOUNTER — Other Ambulatory Visit: Payer: Self-pay

## 2021-01-03 DIAGNOSIS — M545 Low back pain, unspecified: Secondary | ICD-10-CM

## 2021-01-04 ENCOUNTER — Encounter: Payer: Self-pay | Admitting: Rehabilitative and Restorative Service Providers"

## 2021-01-04 ENCOUNTER — Other Ambulatory Visit: Payer: Self-pay

## 2021-01-04 ENCOUNTER — Ambulatory Visit: Payer: Medicare Other | Admitting: Rehabilitative and Restorative Service Providers"

## 2021-01-04 DIAGNOSIS — M545 Low back pain, unspecified: Secondary | ICD-10-CM

## 2021-01-04 DIAGNOSIS — G8929 Other chronic pain: Secondary | ICD-10-CM | POA: Diagnosis not present

## 2021-01-04 DIAGNOSIS — R262 Difficulty in walking, not elsewhere classified: Secondary | ICD-10-CM | POA: Diagnosis not present

## 2021-01-04 NOTE — Therapy (Signed)
[facility] Physical Therapy [address] [place], [place], [address] Phone: [phone]   Fax:  [phone]  Physical Therapy Treatment/Recertification  Patient Details  Name: Chase Wright. MRN: [id number] Date of Birth: [birth year] Referring Provider (PT): Dr. Sharlot Gowda MD   Encounter Date: 01/04/2021  Progress Note Reporting Period 10/23/2020 to 01/04/2021  See note below for Objective Data and Assessment of Progress/Goals.       PT End of Session - 01/04/21 1349     Visit Number 10    Number of Visits 30    Date for PT Re-Evaluation 03/15/21    Authorization Type UHC Medicare    Progress Note Due on Visit 20    PT Start Time 1343    PT Stop Time 1422    PT Time Calculation (min) 39 min    Activity Tolerance Patient tolerated treatment well    Behavior During Therapy WFL for tasks assessed/performed             Past Medical History:  Diagnosis Date   ASHD (arteriosclerotic heart disease)    Chronic kidney disease    RENAL STONES   Diverticulitis    GERD (gastroesophageal reflux disease)    Hx of colonic polyps    Hypertension    Obesity    PUD (peptic ulcer disease)    Renal insufficiency     Past Surgical History:  Procedure Laterality Date   BYPASS GRAFT  08/16/1999   CARDIAC CATHETERIZATION  08/2005   NASAL ENDOSCOPY  2008   VIDEO BRONCHOSCOPY Bilateral 05/01/2018   Procedure: VIDEO BRONCHOSCOPY WITHOUT FLUORO;  Surgeon: Luciano Cutter, MD;  Location: [facility];  Service: Cardiopulmonary;  Laterality: Bilateral;    There were no vitals filed for this visit.   Subjective Assessment - 01/04/21 1343     Subjective Pt. stated back has been doing better since last visit, 3/10 or so at worst.  Pt. stated (emailed clinician earlier this week) he had sharp increase of onset of neck pain one day, improved some to this point c use of TENS unit.  Pt. stated he had a referral being put through to see Dr. Alvester Morin perhaps  about back.  Pt. indicated walking still is difficult even though pain was better.    Limitations House hold activities;Walking;Standing;Lifting    Diagnostic tests xray on back    Patient Stated Goals Reduce pain    Currently in Pain? Yes    Pain Score 3    at worst in last week.   Pain Location Back    Pain Orientation Lower    Pain Descriptors / Indicators Sharp;Tightness    Pain Type Chronic pain    Pain Onset More than a month ago    Pain Frequency Intermittent    Aggravating Factors  movmeent after prolonged sitting, morning pain.    Pain Relieving Factors HEP, passive mobility intervention last visit.                [facility] PT Assessment - 01/04/21 0001       Assessment   Medical Diagnosis Low back pain    Referring Provider (PT) Dr. Sharlot Gowda MD    Onset Date/Surgical Date 05/09/20    Hand Dominance Right      Posture/Postural Control   Posture Comments Reduce lumbar lordosis      AROM   Lumbar Flexion to toes, no complaints of pain, some increased postural sway in balance control    Lumbar Extension 75% tightness  in low back pain      Palpation   Palpation comment passive accessory mobility limitation L3, L4, L5 (improved compared to previous) c mild symptoms noted      Transfers   Comments 18 inch table transfer s UE                           [facility] Adult PT Treatment/Exercise - 01/04/21 0001       Neuro Re-ed    Neuro Re-ed Details  tandem ambulation in bars 10 ft x 6 forward, alternate toe taps 4 inch step 2 x 10 bilateral in bars, heel to toe lifts x 20      Lumbar Exercises: Aerobic   Nustep Lvl 6 10 mins UE/LE      Lumbar Exercises: Machines for Strengthening   Leg Press Double leg 75 lbs 2 x 15      Lumbar Exercises: Standing   Other Standing Lumbar Exercises standing lumbar extension x 10      Lumbar Exercises: Seated   Other Seated Lumbar Exercises 18 inch chair x 10                       PT Short Term  Goals - 11/14/20 1352       PT SHORT TERM GOAL #1   Title Patient will demonstrate independent use of home exercise program to maintain progress from in clinic treatments.    Time 3    Period Weeks    Status Achieved    Target Date 11/13/20               PT Long Term Goals - 01/04/21 1403       PT LONG TERM GOAL #1   Title Patient will demonstrate/report pain at worst less than or equal to 2/10 to facilitate minimal limitation in daily activity secondary to pain symptoms.    Time 10    Period Weeks    Status Revised    Target Date 03/15/21      PT LONG TERM GOAL #2   Title Patient will demonstrate independent use of home exercise program to facilitate ability to maintain/progress functional gains from skilled physical therapy services.    Time 10    Period Weeks    Status Achieved      PT LONG TERM GOAL #3   Title Pt. will demonstrate FOTO outcome > or = 64% to indicated reduced disability due to condition.    Time 10    Period Weeks    Status Revised    Target Date 03/15/21      PT LONG TERM GOAL #4   Title Patient will demonstrate lumbar extension 100 % WFL s symptoms to facilitate upright standing, walking posture at PLOF s limitation.    Time 10    Period Weeks    Status Revised    Target Date 03/15/21      PT LONG TERM GOAL #5   Title Pt. will demonstrate sit to stand transfers from 18 inch chair s UE assist and good control.    Time 10    Period Weeks    Status Achieved                   Plan - 01/04/21 1405     Clinical Impression Statement Pt. has attended 10 visits overall during course of treatment.  See objective data for updated information regarding current  presentation.  Gains have been noted as documented but Pt. did still present c complaints of low back pain c mobility deficits as well as movement coordination deficits in balance that limit his ability from full PLOF. Continued skilled PT services warranted at this time.    Personal  Factors and Comorbidities Comorbidity 3+    Comorbidities HTN, history of dizziness, GERD, chronic kidney disease(stones),    Examination-Activity Limitations Sit;Bend;Stand;Locomotion Level;Transfers;Hygiene/Grooming    Examination-Participation Restrictions Community Activity;Driving;Interpersonal Relationship;Other   walking dogs   Stability/Clinical Decision Making Stable/Uncomplicated    Clinical Decision Making Low    Rehab Potential Good    PT Frequency 2x / week    PT Duration Other (comment)   10 weeks   PT Treatment/Interventions ADLs/Self Care Home Management;Cryotherapy;Electrical Stimulation;Iontophoresis 4mg /ml Dexamethasone;Moist Heat;Traction;Balance training;Therapeutic exercise;Therapeutic activities;Functional mobility training;Stair training;Gait training;DME Instruction;Ultrasound;Neuromuscular re-education;Patient/family education;Passive range of motion;Spinal Manipulations;Joint Manipulations;Dry needling;Taping;Manual techniques    PT Next Visit Plan Continued passive accessory intervention, balance interventions, leg strength    PT Home Exercise Plan [id number]    Consulted and Agree with Plan of Care Patient             Patient will benefit from skilled therapeutic intervention in order to improve the following deficits and impairments:  Abnormal gait, Decreased endurance, Hypomobility, Pain, Decreased strength, Decreased activity tolerance, Decreased balance, Decreased mobility, Difficulty walking, Improper body mechanics, Impaired perceived functional ability, Postural dysfunction, Impaired flexibility, Decreased coordination, Decreased range of motion  Visit Diagnosis: Chronic low back pain without sciatica, unspecified back pain laterality  Difficulty in walking, not elsewhere classified     Problem List 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   Diverticulosis 07/18/2020   Chest pain, rule out acute myocardial  infarction 07/11/2020   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   Morbid obesity (HCC) 01/27/2020   OAB (overactive bladder) 01/27/2020   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 01/18/2019   Sebaceous cyst 01/18/2019   Abdominal aortic atherosclerosis (HCC) 06/10/2017   Arthritis 06/10/2017   History of renal stone 07/25/2016   Benign prostatic hyperplasia with lower urinary tract symptoms 07/25/2016   Hypertension 02/25/2012   Renal insufficiency 02/25/2012   Gastroesophageal reflux disease without esophagitis 02/25/2012   ASHD (arteriosclerotic heart disease) 02/25/2012   History of gout 02/25/2012   History of colonic polyps 02/25/2012   Hyperlipidemia 02/19/2011   Statin intolerance 02/19/2011   02/21/2011, PT, DPT, OCS, ATC 01/04/21  2:15 PM    [facility] Physical Therapy [address] [place], [place], [place] Phone: [phone]   Fax:  [phone]  Name: Jerre Vandrunen. MRN: [name] Date of Birth: [birth year]

## 2021-01-04 NOTE — Telephone Encounter (Signed)
Pt called back about the medicine. Pt wants the muscle relaxer instead of a pain med. He is still doing P/T and [facility] medicine is not working. Please advise [facility]

## 2021-01-09 ENCOUNTER — Ambulatory Visit: Payer: Medicare Other | Admitting: Rehabilitative and Restorative Service Providers"

## 2021-01-09 ENCOUNTER — Encounter: Payer: Self-pay | Admitting: Rehabilitative and Restorative Service Providers"

## 2021-01-09 ENCOUNTER — Other Ambulatory Visit: Payer: Self-pay

## 2021-01-09 DIAGNOSIS — M545 Low back pain, unspecified: Secondary | ICD-10-CM

## 2021-01-09 DIAGNOSIS — R262 Difficulty in walking, not elsewhere classified: Secondary | ICD-10-CM

## 2021-01-09 DIAGNOSIS — G8929 Other chronic pain: Secondary | ICD-10-CM | POA: Diagnosis not present

## 2021-01-09 NOTE — Therapy (Signed)
[facility] Physical Therapy [address] [place], [place], [address] Phone: [phone]   Fax:  [phone]  Physical Therapy Treatment  Patient Details  Name: Chase Wright. MRN: [id number] Date of Birth: [birth year] Referring Provider (PT): Dr. Sharlot Gowda MD   Encounter Date: 01/09/2021   PT End of Session - 01/09/21 1334     Visit Number 11    Number of Visits 30    Date for PT Re-Evaluation 03/15/21    Authorization Type UHC Medicare    Progress Note Due on Visit 20    PT Start Time 1329    PT Stop Time 1408    PT Time Calculation (min) 39 min    Activity Tolerance Patient tolerated treatment well    Behavior During Therapy [facility] for tasks assessed/performed             Past Medical History:  Diagnosis Date   ASHD (arteriosclerotic heart disease)    Chronic kidney disease    RENAL STONES   Diverticulitis    GERD (gastroesophageal reflux disease)    Hx of colonic polyps    Hypertension    Obesity    PUD (peptic ulcer disease)    Renal insufficiency     Past Surgical History:  Procedure Laterality Date   BYPASS GRAFT  08/16/1999   CARDIAC CATHETERIZATION  08/2005   NASAL ENDOSCOPY  2008   VIDEO BRONCHOSCOPY Bilateral 05/01/2018   Procedure: VIDEO BRONCHOSCOPY WITHOUT FLUORO;  Surgeon: Luciano Cutter, MD;  Location: [facility];  Service: Cardiopulmonary;  Laterality: Bilateral;    There were no vitals filed for this visit.   Subjective Assessment - 01/09/21 1333     Subjective Pt. stated 2-3/10 pain at most in last few days.  Stiffnes more than pain indicated.  Still feels like walking is weird and doesn't feel normal.    Limitations House hold activities;Walking;Standing;Lifting    Diagnostic tests xray on back    Patient Stated Goals Reduce pain    Currently in Pain? Yes    Pain Score 2     Pain Location Back    Pain Orientation Lower    Pain Descriptors / Indicators Tightness    Pain Type Chronic pain    Pain  Onset More than a month ago    Pain Frequency Intermittent    Aggravating Factors  inactivity    Pain Relieving Factors stretching                               [facility] Adult PT Treatment/Exercise - 01/09/21 0001       Transfers   Comments 18 inch chair sit to stand quick, slow sit down s UE x 10      Neuro Re-ed    Neuro Re-ed Details  retro step x 10 bilateral, posterior slip balance catching step strategy x 10 bilateral, tandem stance 1 min x 2 bilateral      Lumbar Exercises: Aerobic   Nustep Lvl 6 12 mins UE/LE around 80 steps per minute pace      Lumbar Exercises: Standing   Other Standing Lumbar Exercises standing lumbar extension 2 x 10                       PT Short Term Goals - 11/14/20 1352       PT SHORT TERM GOAL #1   Title Patient will demonstrate independent use of  home exercise program to maintain progress from in clinic treatments.    Time 3    Period Weeks    Status Achieved    Target Date 11/13/20               PT Long Term Goals - 01/04/21 1403       PT LONG TERM GOAL #1   Title Patient will demonstrate/report pain at worst less than or equal to 2/10 to facilitate minimal limitation in daily activity secondary to pain symptoms.    Time 10    Period Weeks    Status Revised    Target Date 03/15/21      PT LONG TERM GOAL #2   Title Patient will demonstrate independent use of home exercise program to facilitate ability to maintain/progress functional gains from skilled physical therapy services.    Time 10    Period Weeks    Status Achieved      PT LONG TERM GOAL #3   Title Pt. will demonstrate FOTO outcome > or = 64% to indicated reduced disability due to condition.    Time 10    Period Weeks    Status Revised    Target Date 03/15/21      PT LONG TERM GOAL #4   Title Patient will demonstrate lumbar extension 100 % WFL s symptoms to facilitate upright standing, walking posture at PLOF s limitation.     Time 10    Period Weeks    Status Revised    Target Date 03/15/21      PT LONG TERM GOAL #5   Title Pt. will demonstrate sit to stand transfers from 18 inch chair s UE assist and good control.    Time 10    Period Weeks    Status Achieved                   Plan - 01/09/21 1358     Clinical Impression Statement Continued encouragement and use of lumbar extension mobility stretching in clinc and HEP for symptom relief.  Continued to incorporate balance interventions and LE strengthening to improve ambulation.    Personal Factors and Comorbidities Comorbidity 3+    Comorbidities HTN, history of dizziness, GERD, chronic kidney disease(stones),    Examination-Activity Limitations Sit;Bend;Stand;Locomotion Level;Transfers;Hygiene/Grooming    Examination-Participation Restrictions Community Activity;Driving;Interpersonal Relationship;Other   walking dogs   Stability/Clinical Decision Making Stable/Uncomplicated    Rehab Potential Good    PT Frequency 2x / week    PT Duration Other (comment)   10 weeks   PT Treatment/Interventions ADLs/Self Care Home Management;Cryotherapy;Electrical Stimulation;Iontophoresis 4mg /ml Dexamethasone;Moist Heat;Traction;Balance training;Therapeutic exercise;Therapeutic activities;Functional mobility training;Stair training;Gait training;DME Instruction;Ultrasound;Neuromuscular re-education;Patient/family education;Passive range of motion;Spinal Manipulations;Joint Manipulations;Dry needling;Taping;Manual techniques    PT Next Visit Plan Continued passive accessory intervention as indicated, balance interventions, leg strength    PT Home Exercise Plan [id number]    Consulted and Agree with Plan of Care Patient             Patient will benefit from skilled therapeutic intervention in order to improve the following deficits and impairments:  Abnormal gait, Decreased endurance, Hypomobility, Pain, Decreased strength, Decreased activity tolerance, Decreased  balance, Decreased mobility, Difficulty walking, Improper body mechanics, Impaired perceived functional ability, Postural dysfunction, Impaired flexibility, Decreased coordination, Decreased range of motion  Visit Diagnosis: Chronic low back pain without sciatica, unspecified back pain laterality  Difficulty in walking, not elsewhere classified     Problem List Patient Active Problem List   Diagnosis Date  Noted   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   Diverticulosis 07/18/2020   Chest pain, rule out acute myocardial infarction 07/11/2020   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   Morbid obesity (HCC) 01/27/2020   OAB (overactive bladder) 01/27/2020   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 01/18/2019   Sebaceous cyst 01/18/2019   Abdominal aortic atherosclerosis (HCC) 06/10/2017   Arthritis 06/10/2017   History of renal stone 07/25/2016   Benign prostatic hyperplasia with lower urinary tract symptoms 07/25/2016   Hypertension 02/25/2012   Renal insufficiency 02/25/2012   Gastroesophageal reflux disease without esophagitis 02/25/2012   ASHD (arteriosclerotic heart disease) 02/25/2012   History of gout 02/25/2012   History of colonic polyps 02/25/2012   Hyperlipidemia 02/19/2011   Statin intolerance 02/19/2011    Chyrel Masson, PT, DPT, OCS, ATC 01/09/21  2:05 PM    [facility] Physical Therapy [address] [place], [place], [address] Phone: [phone]   Fax:  [phone]  Name: Chase Wright. MRN: [id number] Date of Birth: 10/21/1945

## 2021-01-16 ENCOUNTER — Encounter: Payer: Self-pay | Admitting: Rehabilitative and Restorative Service Providers"

## 2021-01-16 ENCOUNTER — Other Ambulatory Visit: Payer: Self-pay

## 2021-01-16 ENCOUNTER — Ambulatory Visit: Payer: Medicare Other | Admitting: Rehabilitative and Restorative Service Providers"

## 2021-01-16 DIAGNOSIS — R262 Difficulty in walking, not elsewhere classified: Secondary | ICD-10-CM

## 2021-01-16 DIAGNOSIS — G8929 Other chronic pain: Secondary | ICD-10-CM

## 2021-01-16 DIAGNOSIS — M545 Low back pain, unspecified: Secondary | ICD-10-CM

## 2021-01-16 NOTE — Therapy (Signed)
[facility] [address] [place], [place], [address] Phone: [phone]   Fax:  [phone]  Physical Therapy Treatment  Patient Details  Name: Chase Wright. MRN: [id number] Date of Birth: 28-Jan-1946 Referring Provider (PT): Dr. Sharlot Gowda MD   Encounter Date: 01/16/2021   PT End of Session - 01/16/21 1336     Visit Number 12    Number of Visits 30    Date for PT Re-Evaluation 03/15/21    Authorization Type U[employer] Medicare    Progress Note Due on Visit 20    PT Start Time 1337    PT Stop Time 1416    PT Time Calculation (min) 39 min    Activity Tolerance Patient tolerated treatment well    Behavior During Therapy [facility] for tasks assessed/performed             Past Medical History:  Diagnosis Date   ASHD (arteriosclerotic heart disease)    Chronic kidney disease    RENAL STONES   Diverticulitis    GERD (gastroesophageal reflux disease)    Hx of colonic polyps    Hypertension    Obesity    PUD (peptic ulcer disease)    Renal insufficiency     Past Surgical History:  Procedure Laterality Date   BYPASS GRAFT  08/16/1999   CARDIAC CATHETERIZATION  08/2005   NASAL ENDOSCOPY  2008   VIDEO BRONCHOSCOPY Bilateral 05/01/2018   Procedure: VIDEO BRONCHOSCOPY WITHOUT FLUORO;  Surgeon: Luciano Cutter, MD;  Location: [facility];  Service: Cardiopulmonary;  Laterality: Bilateral;    There were no vitals filed for this visit.   Subjective Assessment - 01/16/21 1340     Subjective Pt. indicated no particular pain in back today.  Pt. indicated he still feels like getting walking and coordination isn't quite the same.  Mentioned he has a visit with Dr.  Blas office.    Limitations House hold activities;Walking;Standing;Lifting    Diagnostic tests xray on back    Patient Stated Goals Reduce pain    Currently in Pain? No/denies    Pain Score 0-No pain    Pain Onset More than a month ago                [facility] PT  Assessment - 01/16/21 0001       Assessment   Medical Diagnosis Low back pain    Referring Provider (PT) Dr. Sharlot Gowda MD    Onset Date/Surgical Date 05/09/20    Hand Dominance Right      Ambulation/Gait   Gait Pattern Decreased stride length;Shuffle;Wide base of support   More noted initially in ambulation but improved c several feet                          [facility] Adult PT Treatment/Exercise - 01/16/21 0001       Neuro Re-ed    Neuro Re-ed Details  alt toe tapping 6 inch step 2 x 10 bilateral, feet together on foam balance EO 1 min, EC 1 min, lateral stepping in bars 10 ft x 5 each way, posterior slip balance stepping x 10 bilateral leg      Lumbar Exercises: Aerobic   Nustep Lvl 6 12 mins UE/LE around 80 steps per minute pace      Lumbar Exercises: Machines for Strengthening   Leg Press Double leg 87 lbs 3 x 10      Lumbar Exercises: Standing   Other Standing Lumbar  Exercises standing lumbar extension x 10      Lumbar Exercises: Seated   Other Seated Lumbar Exercises 18 inch chair x 10                       PT Short Term Goals - 11/14/20 1352       PT SHORT TERM GOAL #1   Title Patient will demonstrate independent use of home exercise program to maintain progress from in clinic treatments.    Time 3    Period Weeks    Status Achieved    Target Date 11/13/20               PT Long Term Goals - 01/04/21 1403       PT LONG TERM GOAL #1   Title Patient will demonstrate/report pain at worst less than or equal to 2/10 to facilitate minimal limitation in daily activity secondary to pain symptoms.    Time 10    Period Weeks    Status Revised    Target Date 03/15/21      PT LONG TERM GOAL #2   Title Patient will demonstrate independent use of home exercise program to facilitate ability to maintain/progress functional gains from skilled physical therapy services.    Time 10    Period Weeks    Status Achieved      PT LONG TERM  GOAL #3   Title Pt. will demonstrate FOTO outcome > or = 64% to indicated reduced disability due to condition.    Time 10    Period Weeks    Status Revised    Target Date 03/15/21      PT LONG TERM GOAL #4   Title Patient will demonstrate lumbar extension 100 % WFL s symptoms to facilitate upright standing, walking posture at PLOF s limitation.    Time 10    Period Weeks    Status Revised    Target Date 03/15/21      PT LONG TERM GOAL #5   Title Pt. will demonstrate sit to stand transfers from 18 inch chair s UE assist and good control.    Time 10    Period Weeks    Status Achieved                   Plan - 01/16/21 1414     Clinical Impression Statement Fair control on compliant surface, EC activity today.  Continued efforts on improving coordination and balance control and LE strength to facilitate improved progressive mobility performed and indicated to conitnue at this time.  Medical necessity for continued treatment to promote continued improvement in lumbar mobility/symptoms and improve functional mobility/reduced fall risk.    Personal Factors and Comorbidities Comorbidity 3+    Comorbidities HTN, history of dizziness, GERD, chronic kidney disease(stones),    Examination-Activity Limitations Sit;Bend;Stand;Locomotion Level;Transfers;Hygiene/Grooming    Examination-Participation Restrictions Community Activity;Driving;Interpersonal Relationship;Other   walking dogs   Stability/Clinical Decision Making Stable/Uncomplicated    Rehab Potential Good    PT Frequency 2x / week    PT Duration Other (comment)   10 weeks   PT Treatment/Interventions ADLs/Self Care Home Management;Cryotherapy;Electrical Stimulation;Iontophoresis 4mg /ml Dexamethasone;Moist Heat;Traction;Balance training;Therapeutic exercise;Therapeutic activities;Functional mobility training;Stair training;Gait training;DME Instruction;Ultrasound;Neuromuscular re-education;Patient/family education;Passive range of  motion;Spinal Manipulations;Joint Manipulations;Dry needling;Taping;Manual techniques    PT Next Visit Plan Monitor and treat back pain prn.  Dynamic stability, compliant surface balance.    PT Home Exercise Plan [id number]    Consulted and Agree with Plan  of Care Patient             Patient will benefit from skilled therapeutic intervention in order to improve the following deficits and impairments:  Abnormal gait, Decreased endurance, Hypomobility, Pain, Decreased strength, Decreased activity tolerance, Decreased balance, Decreased mobility, Difficulty walking, Improper body mechanics, Impaired perceived functional ability, Postural dysfunction, Impaired flexibility, Decreased coordination, Decreased range of motion  Visit Diagnosis: Chronic low back pain without sciatica, unspecified back pain laterality  Difficulty in walking, not elsewhere classified     Problem List 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 01/18/2019   Sebaceous cyst 01/18/2019   Abdominal aortic atherosclerosis (HCC) 06/10/2017   Arthritis 06/10/2017   History of renal stone 07/25/2016   Benign prostatic hyperplasia with lower urinary tract symptoms 07/25/2016   Hypertension 02/25/2012   Renal insufficiency 02/25/2012   Gastroesophageal reflux disease without esophagitis 02/25/2012   ASHD (arteriosclerotic heart disease) 02/25/2012   History of gout 02/25/2012   History of colonic polyps 02/25/2012   Hyperlipidemia 02/19/2011   Statin intolerance 02/19/2011    Chyrel Masson, PT, DPT, OCS, ATC 01/16/21  2:16 PM    [facility] [address] [place], [place], [address] Phone: [phone]    Fax:  [phone]  Name: Chase Wright. MRN: [id number] Date of Birth: 1945-06-17

## 2021-01-22 ENCOUNTER — Ambulatory Visit: Payer: Medicare Other | Admitting: Physical Medicine and Rehabilitation

## 2021-01-23 ENCOUNTER — Encounter: Payer: Self-pay | Admitting: Rehabilitative and Restorative Service Providers"

## 2021-01-23 ENCOUNTER — Ambulatory Visit: Payer: Medicare Other | Admitting: Rehabilitative and Restorative Service Providers"

## 2021-01-23 ENCOUNTER — Other Ambulatory Visit: Payer: Self-pay

## 2021-01-23 DIAGNOSIS — G8929 Other chronic pain: Secondary | ICD-10-CM

## 2021-01-23 DIAGNOSIS — M545 Low back pain, unspecified: Secondary | ICD-10-CM | POA: Diagnosis not present

## 2021-01-23 DIAGNOSIS — R262 Difficulty in walking, not elsewhere classified: Secondary | ICD-10-CM

## 2021-01-23 NOTE — Therapy (Signed)
[facility] [address] [place], [place], [address] Phone: [phone]   Fax:  [phone]  Physical Therapy Treatment  Patient Details  Name: Chase Wright. MRN: [id number] Date of Birth: Aug 18, 1945 Referring Provider (PT): Dr. Sharlot Gowda MD   Encounter Date: 01/23/2021   PT End of Session - 01/23/21 1346     Visit Number 13    Number of Visits 30    Date for PT Re-Evaluation 03/15/21    Authorization Type UHC Medicare    Progress Note Due on Visit 20    PT Start Time 1343    PT Stop Time 1422    PT Time Calculation (min) 39 min    Activity Tolerance Patient tolerated treatment well    Behavior During Therapy [facility] for tasks assessed/performed             Past Medical History:  Diagnosis Date   ASHD (arteriosclerotic heart disease)    Chronic kidney disease    RENAL STONES   Diverticulitis    GERD (gastroesophageal reflux disease)    Hx of colonic polyps    Hypertension    Obesity    PUD (peptic ulcer disease)    Renal insufficiency     Past Surgical History:  Procedure Laterality Date   BYPASS GRAFT  08/16/1999   CARDIAC CATHETERIZATION  08/2005   NASAL ENDOSCOPY  2008   VIDEO BRONCHOSCOPY Bilateral 05/01/2018   Procedure: VIDEO BRONCHOSCOPY WITHOUT FLUORO;  Surgeon: Luciano Cutter, MD;  Location: [facility];  Service: Cardiopulmonary;  Laterality: Bilateral;    There were no vitals filed for this visit.   Subjective Assessment - 01/23/21 1349     Subjective Pt. indicated some mild increase in back pain compared to previous week.  Pt. indicated using TENS unit and massager that seemed to help.  Pt. stated still unsteady on feet as major concern at this time.    Limitations House hold activities;Walking;Standing;Lifting    Diagnostic tests xray on back    Patient Stated Goals Reduce pain    Currently in Pain? Yes    Pain Location Back    Pain Orientation Lower    Pain Descriptors / Indicators  Tightness    Pain Type Chronic pain    Pain Onset More than a month ago    Pain Frequency Intermittent    Aggravating Factors  uncertain why    Pain Relieving Factors TENS unit, HEP                [facility] PT Assessment - 01/23/21 0001       Assessment   Medical Diagnosis Low back pain    Referring Provider (PT) Dr. Sharlot Gowda MD    Onset Date/Surgical Date 05/09/20    Hand Dominance Right      AROM   Lumbar Extension 100% after 2 x 10 of repeated extension                           [facility] Adult PT Treatment/Exercise - 01/23/21 0001       Neuro Re-ed    Neuro Re-ed Details  alt toe tapping 6 inch step 2 x 10 bilateral, modified tandem stance c front foot on 6 inch step 30 sec x 2 bilateral, BIG inspired fwd stepping x 15 bilateral      Lumbar Exercises: Stretches   Other Lumbar Stretch Exercise standing lumbar extension 2 x 10  Lumbar Exercises: Aerobic   Nustep Lvl 6 15 mins UE/LE SPM around 80      Lumbar Exercises: Machines for Strengthening   Leg Press Double leg 87 lbs 3 x 15                     PT Education - 01/23/21 1411     Education Details Verbal review of POC, focus on HEP for mobility gains and inclusion of balance in clinic.    Person(s) Educated Patient    Methods Explanation    Comprehension Verbalized understanding              PT Short Term Goals - 11/14/20 1352       PT SHORT TERM GOAL #1   Title Patient will demonstrate independent use of home exercise program to maintain progress from in clinic treatments.    Time 3    Period Weeks    Status Achieved    Target Date 11/13/20               PT Long Term Goals - 01/04/21 1403       PT LONG TERM GOAL #1   Title Patient will demonstrate/report pain at worst less than or equal to 2/10 to facilitate minimal limitation in daily activity secondary to pain symptoms.    Time 10    Period Weeks    Status Revised    Target Date 03/15/21      PT  LONG TERM GOAL #2   Title Patient will demonstrate independent use of home exercise program to facilitate ability to maintain/progress functional gains from skilled physical therapy services.    Time 10    Period Weeks    Status Achieved      PT LONG TERM GOAL #3   Title Pt. will demonstrate FOTO outcome > or = 64% to indicated reduced disability due to condition.    Time 10    Period Weeks    Status Revised    Target Date 03/15/21      PT LONG TERM GOAL #4   Title Patient will demonstrate lumbar extension 100 % WFL s symptoms to facilitate upright standing, walking posture at PLOF s limitation.    Time 10    Period Weeks    Status Revised    Target Date 03/15/21      PT LONG TERM GOAL #5   Title Pt. will demonstrate sit to stand transfers from 18 inch chair s UE assist and good control.    Time 10    Period Weeks    Status Achieved                   Plan - 01/23/21 1411     Clinical Impression Statement Continued response noted from repeated extension for mobility gains in lumbar spine.  Pt. has demonstrate quality gains in static balance as observed in reduction in loss of balance/clinician assist.  Continued skilled PT services to improve strength, mobility and balance control to aide functional ambulation and progressive mobility indicated at ths time.    Personal Factors and Comorbidities Comorbidity 3+    Comorbidities HTN, history of dizziness, GERD, chronic kidney disease(stones),    Examination-Activity Limitations Sit;Bend;Stand;Locomotion Level;Transfers;Hygiene/Grooming    Examination-Participation Restrictions Community Activity;Driving;Interpersonal Relationship;Other   walking dogs   Stability/Clinical Decision Making Stable/Uncomplicated    Rehab Potential Good    PT Frequency 2x / week    PT Duration Other (comment)   10  weeks   PT Treatment/Interventions ADLs/Self Care Home Management;Cryotherapy;Electrical Stimulation;Iontophoresis 4mg /ml  Dexamethasone;Moist Heat;Traction;Balance training;Therapeutic exercise;Therapeutic activities;Functional mobility training;Stair training;Gait training;DME Instruction;Ultrasound;Neuromuscular re-education;Patient/family education;Passive range of motion;Spinal Manipulations;Joint Manipulations;Dry needling;Taping;Manual techniques    PT Next Visit Plan Monitor and treat back pain prn.  Continue to improve balance control.    PT Home Exercise Plan [id number]    Consulted and Agree with Plan of Care Patient             Patient will benefit from skilled therapeutic intervention in order to improve the following deficits and impairments:  Abnormal gait, Decreased endurance, Hypomobility, Pain, Decreased strength, Decreased activity tolerance, Decreased balance, Decreased mobility, Difficulty walking, Improper body mechanics, Impaired perceived functional ability, Postural dysfunction, Impaired flexibility, Decreased coordination, Decreased range of motion  Visit Diagnosis: Chronic low back pain without sciatica, unspecified back pain laterality  Difficulty in walking, not elsewhere classified     Problem List 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 01/18/2019   Sebaceous cyst 01/18/2019   Abdominal aortic atherosclerosis (HCC) 06/10/2017   Arthritis 06/10/2017   History of renal stone 07/25/2016   Benign prostatic hyperplasia with lower urinary tract symptoms 07/25/2016   Hypertension 02/25/2012   Renal insufficiency 02/25/2012   Gastroesophageal reflux disease without esophagitis 02/25/2012   ASHD (arteriosclerotic heart disease) 02/25/2012   History of gout 02/25/2012   History of colonic polyps 02/25/2012    Hyperlipidemia 02/19/2011   Statin intolerance 02/19/2011   02/21/2011, PT, DPT, OCS, ATC 01/23/21  2:17 PM    [facility] [address] [place], [place], [place] Phone: [phone]   Fax:  [phone]  Name: Jaykwon Morones. MRN: [name] Date of Birth: 02-03-1946

## 2021-01-29 ENCOUNTER — Other Ambulatory Visit: Payer: Self-pay

## 2021-01-29 ENCOUNTER — Ambulatory Visit: Payer: Medicare Other | Admitting: Physical Medicine and Rehabilitation

## 2021-01-29 VITALS — BP 156/97 | HR 94

## 2021-01-29 DIAGNOSIS — G8929 Other chronic pain: Secondary | ICD-10-CM

## 2021-01-29 DIAGNOSIS — M545 Low back pain, unspecified: Secondary | ICD-10-CM

## 2021-01-29 DIAGNOSIS — M5136 Other intervertebral disc degeneration, lumbar region: Secondary | ICD-10-CM

## 2021-01-29 DIAGNOSIS — Z9181 History of falling: Secondary | ICD-10-CM

## 2021-01-29 NOTE — Progress Notes (Signed)
Chase Wright. - 75 y.o. male MRN [id number]  Date of birth: 1945/06/01  Office Visit Note: Visit Date: 01/29/2021 PCP: Ronnald Nian, MD Referred by: Ronnald Nian, MD  Subjective: Chief Complaint  Patient presents with   Lower Back - Pain   Left Leg - Pain   Right Leg - Pain   Right Foot - Pain, Numbness   Left Foot - Pain, Numbness   HPI: Chase Wright. is a 75 y.o. male who comes in today per the request of Dr. Sharlot Gowda for evaluation of bilateral lower back pain. Patient states pain started after a fall in February of this year. Patient reports pain has been chronic and has increased over the last several months. Patient states pain is exacerbated by activity and movement, currently describes as soreness and aching sensation, rates as 7 out of 10. Patient is currently attending formal physical therapy at our in house facility, which he reports is helping significantly. Patient also reports some pain relief with home exercise regimen, use of medications, handheld massager and TENS unit. Patient also reports his pain does ease after walking for several minutes. Patient's lumbar x-ray images from June exhibits multilevel facet arthropathy. Patient reports chronic ongoing issues with burning/numbness to bottom of feet, arms and hands. Patient also voices concerns about balance and unsteady gait. Patient reports he stopped driving several months ago because of the worsening numbness to bottom of his feet. Patient states he has not been formally evaluated for possible neuropathy. Patient denies focal weakness, numbness and tingling.   Review of Systems  Musculoskeletal:  Positive for back pain.  Neurological:  Positive for tingling and sensory change. Negative for focal weakness and weakness.  All other systems reviewed and are negative. Otherwise per HPI.  Assessment & Plan: Visit Diagnoses:    ICD-10-CM   1. Chronic bilateral low back pain without sciatica  M54.50  MR LUMBAR SPINE WO CONTRAST   G89.29        Plan: Findings:  Chronic, worsening and severe bilateral lower back pain.  Pain ongoing since fall in February of this year.  The patient continues to have excruciating pain despite good conservative therapy such as formal physical therapy, home exercise program, use of TENS unit and medications.  Patient's clinical presentation and exam are consistent with facet mediated pain.  Recent lumbar x-ray images do show multilevel facet arthropathy.  We feel the next step is to obtain lumbar MRI imaging, which we did place an order for today.  Patient states he has trouble laying down for long periods of time, however he does not wish to do lumbar MRI with sedation as he reports previous issues waking up after surgery.  Patient states he does not have any anxiety issues or problems with claustrophobia.  Patient encouraged to continue attending formal physical therapy.  He is also encouraged to continue performing home exercises and to stay active as tolerated.  Patient instructed to follow-up with Dr. Sharlot Gowda for evaluation of continued issues with numbness/tingling to hands/feet and gait issues.  No red flag symptoms noted upon exam today.   Meds & Orders: No orders of the defined types were placed in this encounter.   Orders Placed This Encounter  Procedures   MR LUMBAR SPINE WO CONTRAST    Follow-up: Return in about 1 month (around 03/01/2021) for follow-up after lumbar MRI for review and to discuss further treatment options.   Procedures: No procedures performed  Clinical History: No specialty comments available.   He reports that he has never smoked. He has never used smokeless tobacco. No results for input(s): HGBA1C, LABURIC in the last 8760 hours.  Objective:  VS:  HT:    WT:   BMI:     BP:(!) 156/97  HR:94bpm  TEMP: ( )  RESP:  Physical Exam Vitals and nursing note reviewed.  HENT:     Head: Normocephalic and atraumatic.      Right Ear: External ear normal.     Left Ear: External ear normal.     Nose: Nose normal.     Mouth/Throat:     Mouth: Mucous membranes are moist.  Eyes:     Pupils: Pupils are equal, round, and reactive to light.  Cardiovascular:     Rate and Rhythm: Normal rate.     Pulses: Normal pulses.  Pulmonary:     Effort: Pulmonary effort is normal.  Abdominal:     General: Abdomen is flat. There is no distension.  Musculoskeletal:        General: Tenderness present.     Cervical back: Normal range of motion.     Comments: Pt rises from seated position to standing without difficulty. Pain noted upon facet loading. Strong distal strength without clonus, no pain upon palpation of greater trochanters. Sensation intact bilaterally. Walks independently, gait slow and unsteady.    Skin:    General: Skin is warm and dry.     Capillary Refill: Capillary refill takes less than 2 seconds.  Neurological:     Mental Status: He is alert.     Gait: Gait abnormal.  Psychiatric:        Mood and Affect: Mood normal.    Ortho Exam  Imaging: No results found.  Past Medical/Family/Surgical/Social History: Medications & Allergies reviewed per EMR, new medications updated. 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 01/18/2019   Sebaceous cyst 01/18/2019   Abdominal aortic atherosclerosis (HCC) 06/10/2017   Arthritis 06/10/2017   History of renal stone 07/25/2016   Benign prostatic hyperplasia with lower urinary tract symptoms 07/25/2016   Hypertension 02/25/2012   Renal insufficiency 02/25/2012   Gastroesophageal reflux disease without esophagitis 02/25/2012   ASHD (arteriosclerotic heart disease) 02/25/2012   History of gout  02/25/2012   History of colonic polyps 02/25/2012   Hyperlipidemia 02/19/2011   Statin intolerance 02/19/2011   Past Medical History:  Diagnosis Date   ASHD (arteriosclerotic heart disease)    Chronic kidney disease    RENAL STONES   Diverticulitis    GERD (gastroesophageal reflux disease)    Hx of colonic polyps    Hypertension    Obesity    PUD (peptic ulcer disease)    Renal insufficiency    Family History  Problem Relation Age of Onset   Diabetes Mother    Prostate cancer Father 59   Colon cancer Neg Hx    Past Surgical History:  Procedure Laterality Date   BYPASS GRAFT  08/16/1999   CARDIAC CATHETERIZATION  08/2005   NASAL ENDOSCOPY  2008   VIDEO BRONCHOSCOPY Bilateral 05/01/2018   Procedure: VIDEO BRONCHOSCOPY WITHOUT FLUORO;  Surgeon: Luciano Cutter, MD;  Location: [facility];  Service: Cardiopulmonary;  Laterality: Bilateral;   Social History   Occupational History   Not on file  Tobacco Use   Smoking status: Never   Smokeless tobacco: Never  Vaping Use   Vaping Use: Never used  Substance and Sexual Activity   Alcohol use: Yes    Comment: rare beer   Drug use: No   Sexual activity: Not Currently

## 2021-01-29 NOTE — Progress Notes (Signed)
Pt state lower back pain that travels down both legs. Pt state numbness in both feet. Pt state he fall in February and twisted his back. Pt state he goes to PT for the past eight weeks. Pt state he still feel a lot of pain in his back. Pt state standing and take the first step pt feels a pull in his back. Pt state he takes over the counter pain meds and heating to help ease his pain. Pt state he uses a tens unit and sit in a chair to help ease his pain.  Numeric Pain Rating Scale and Functional Assessment Average Pain 8 Pain Right Now 6 My pain is intermittent, sharp, and stabbing Pain is worse with: standing, some activites, and taking to first step Pain improves with: therapy/exercise and medication   In the last MONTH (on 0-10 scale) has pain interfered with the following?  1. General activity like being  able to carry out your everyday physical activities such as walking, climbing stairs, carrying groceries, or moving a chair?  Rating(7)  2. Relation with others like being able to carry out your usual social activities and roles such as  activities at home, at work and in your community. Rating(8)  3. Enjoyment of life such that you have  been bothered by emotional problems such as feeling anxious, depressed or irritable?  Rating(9)

## 2021-01-30 ENCOUNTER — Ambulatory Visit: Payer: Medicare Other | Admitting: Rehabilitative and Restorative Service Providers"

## 2021-01-30 ENCOUNTER — Encounter: Payer: Self-pay | Admitting: Rehabilitative and Restorative Service Providers"

## 2021-01-30 ENCOUNTER — Encounter: Payer: Self-pay | Admitting: Physical Medicine and Rehabilitation

## 2021-01-30 DIAGNOSIS — M545 Low back pain, unspecified: Secondary | ICD-10-CM | POA: Diagnosis not present

## 2021-01-30 DIAGNOSIS — G8929 Other chronic pain: Secondary | ICD-10-CM | POA: Diagnosis not present

## 2021-01-30 DIAGNOSIS — R262 Difficulty in walking, not elsewhere classified: Secondary | ICD-10-CM | POA: Diagnosis not present

## 2021-01-30 NOTE — Therapy (Signed)
[facility] [address] [place], [place], [address] Phone: [phone]   Fax:  [phone]  Physical Therapy Treatment  Patient Details  Name: Chase Wright. MRN: [id number] Date of Birth: 07/22/1945 Referring Provider (PT): Dr. Sharlot Gowda MD   Encounter Date: 01/30/2021   PT End of Session - 01/30/21 1355     Visit Number 14    Number of Visits 30    Date for PT Re-Evaluation 03/15/21    Authorization Type [employer] Medicare    Progress Note Due on Visit 20    PT Start Time 1344    PT Stop Time 1416    PT Time Calculation (min) 32 min    Activity Tolerance Patient tolerated treatment well    Behavior During Therapy [facility] for tasks assessed/performed             Past Medical History:  Diagnosis Date   ASHD (arteriosclerotic heart disease)    Chronic kidney disease    RENAL STONES   Diverticulitis    GERD (gastroesophageal reflux disease)    Hx of colonic polyps    Hypertension    Obesity    PUD (peptic ulcer disease)    Renal insufficiency     Past Surgical History:  Procedure Laterality Date   BYPASS GRAFT  08/16/1999   CARDIAC CATHETERIZATION  08/2005   NASAL ENDOSCOPY  2008   VIDEO BRONCHOSCOPY Bilateral 05/01/2018   Procedure: VIDEO BRONCHOSCOPY WITHOUT FLUORO;  Surgeon: Luciano Cutter, MD;  Location: [facility];  Service: Cardiopulmonary;  Laterality: Bilateral;    There were no vitals filed for this visit.   Subjective Assessment - 01/30/21 1351     Subjective Pt. arrived stating his Rt knee has been giving him trouble recently, some pain but some giving way.  Pt. indicated his back has been hurting some more than the better weeks but not severe.  Pt. indicated seeing PA recently and indication of possible MRI to be performed.    Limitations House hold activities;Walking;Standing;Lifting    Diagnostic tests xray on back    Patient Stated Goals Reduce pain    Currently in Pain? Yes    Pain Score 4      Pain Location Back    Pain Orientation Lower    Pain Descriptors / Indicators Tightness    Pain Type Chronic pain    Pain Onset More than a month ago    Pain Frequency Intermittent    Aggravating Factors  has been more inactive with knee complaints limiting walking and HEP.    Pain Relieving Factors HEP                [facility] PT Assessment - 01/30/21 0001       Assessment   Medical Diagnosis Low back pain    Referring Provider (PT) Dr. Sharlot Gowda MD    Onset Date/Surgical Date 05/09/20    Hand Dominance Right      Ambulation/Gait   Gait Comments Noted decreased stance on Rt leg in ambulation c antalgic gait noted, decreased Lt leg step length secondarily                           [facility] Adult PT Treatment/Exercise - 01/30/21 0001       Lumbar Exercises: Stretches   Lower Trunk Rotation 5 reps   15 seconds x 5 bilateral     Lumbar Exercises: Aerobic   Nustep Lvl 6 15  mins UE/LE SPM around 80      Lumbar Exercises: Standing   Other Standing Lumbar Exercises standing lumbar extension x 5      Lumbar Exercises: Supine   Bridge 20 reps;Non-compliant                       PT Short Term Goals - 11/14/20 1352       PT SHORT TERM GOAL #1   Title Patient will demonstrate independent use of home exercise program to maintain progress from in clinic treatments.    Time 3    Period Weeks    Status Achieved    Target Date 11/13/20               PT Long Term Goals - 01/30/21 1354       PT LONG TERM GOAL #1   Title Patient will demonstrate/report pain at worst less than or equal to 2/10 to facilitate minimal limitation in daily activity secondary to pain symptoms.    Time 10    Period Weeks    Status On-going    Target Date 03/15/21      PT LONG TERM GOAL #2   Title Patient will demonstrate independent use of home exercise program to facilitate ability to maintain/progress functional gains from skilled physical therapy services.     Time 10    Period Weeks    Status On-going    Target Date 03/15/21      PT LONG TERM GOAL #3   Title Pt. will demonstrate FOTO outcome > or = 64% to indicated reduced disability due to condition.    Time 10    Period Weeks    Status On-going    Target Date 03/15/21      PT LONG TERM GOAL #4   Title Patient will demonstrate lumbar extension 100 % WFL s symptoms to facilitate upright standing, walking posture at PLOF s limitation.    Time 10    Period Weeks    Status On-going    Target Date 03/15/21      PT LONG TERM GOAL #5   Title Pt. will demonstrate sit to stand transfers from 18 inch chair s UE assist and good control.    Time 10    Period Weeks    Status Achieved    Target Date --                   Plan - 01/30/21 1420     Clinical Impression Statement Treatment session impacted by complaints from Rt knee, limiting standing tolerance activity.  Lumbar complaints more evident today but improved c repeated movements and HEP to promote improved lumbar mobility.  Continued focus on back mobility gains and incorporation of balance progression as knee allows upon return.    Personal Factors and Comorbidities Comorbidity 3+    Comorbidities HTN, history of dizziness, GERD, chronic kidney disease(stones),    Examination-Activity Limitations Sit;Bend;Stand;Locomotion Level;Transfers;Hygiene/Grooming    Examination-Participation Restrictions Community Activity;Driving;Interpersonal Relationship;Other   walking dogs   Stability/Clinical Decision Making Stable/Uncomplicated    Rehab Potential Good    PT Frequency 2x / week    PT Duration Other (comment)   10 weeks   PT Treatment/Interventions ADLs/Self Care Home Management;Cryotherapy;Electrical Stimulation;Iontophoresis 4mg /ml Dexamethasone;Moist Heat;Traction;Balance training;Therapeutic exercise;Therapeutic activities;Functional mobility training;Stair training;Gait training;DME Instruction;Ultrasound;Neuromuscular  re-education;Patient/family education;Passive range of motion;Spinal Manipulations;Joint Manipulations;Dry needling;Taping;Manual techniques    PT Next Visit Plan Monitor and treat back pain prn.  Continue to  improve balance control.    PT Home Exercise Plan [id number]    Consulted and Agree with Plan of Care Patient             Patient will benefit from skilled therapeutic intervention in order to improve the following deficits and impairments:  Abnormal gait, Decreased endurance, Hypomobility, Pain, Decreased strength, Decreased activity tolerance, Decreased balance, Decreased mobility, Difficulty walking, Improper body mechanics, Impaired perceived functional ability, Postural dysfunction, Impaired flexibility, Decreased coordination, Decreased range of motion  Visit Diagnosis: Chronic low back pain without sciatica, unspecified back pain laterality  Difficulty in walking, not elsewhere classified     Problem List 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 01/18/2019   Sebaceous cyst 01/18/2019   Abdominal aortic atherosclerosis (HCC) 06/10/2017   Arthritis 06/10/2017   History of renal stone 07/25/2016   Benign prostatic hyperplasia with lower urinary tract symptoms 07/25/2016   Hypertension 02/25/2012   Renal insufficiency 02/25/2012   Gastroesophageal reflux disease without esophagitis 02/25/2012   ASHD (arteriosclerotic heart disease) 02/25/2012   History of gout 02/25/2012   History of colonic polyps 02/25/2012   Hyperlipidemia 02/19/2011   Statin intolerance 02/19/2011    Chyrel Masson, PT, DPT, OCS, ATC 01/30/21  2:27 PM    [facility] [address] [place], [place], [address] Phone: [phone]   Fax:  [phone]  Name: Chase Wright. MRN: [id number] Date of Birth: 1945-07-24

## 2021-02-05 ENCOUNTER — Ambulatory Visit (INDEPENDENT_AMBULATORY_CARE_PROVIDER_SITE_OTHER): Payer: Medicare Other | Admitting: Rehabilitative and Restorative Service Providers"

## 2021-02-05 ENCOUNTER — Encounter: Payer: Self-pay | Admitting: Rehabilitative and Restorative Service Providers"

## 2021-02-05 ENCOUNTER — Other Ambulatory Visit: Payer: Self-pay

## 2021-02-05 DIAGNOSIS — R262 Difficulty in walking, not elsewhere classified: Secondary | ICD-10-CM | POA: Diagnosis not present

## 2021-02-05 DIAGNOSIS — G8929 Other chronic pain: Secondary | ICD-10-CM

## 2021-02-05 DIAGNOSIS — M545 Low back pain, unspecified: Secondary | ICD-10-CM

## 2021-02-05 NOTE — Therapy (Signed)
[facility] [address] [place], [place], [address] Phone: [phone]   Fax:  [phone]  Physical Therapy Treatment  Patient Details  Name: Chase Wright. MRN: [id number] Date of Birth: 03/09/1946 Referring Provider (PT): Dr. Sharlot Gowda MD   Encounter Date: 02/05/2021   PT End of Session - 02/05/21 1328     Visit Number 15    Number of Visits 30    Date for PT Re-Evaluation 03/15/21    Authorization Type [employer] Medicare    Progress Note Due on Visit 20    PT Start Time 1337    PT Stop Time 1420    PT Time Calculation (min) 43 min    Activity Tolerance Patient tolerated treatment well    Behavior During Therapy [facility] for tasks assessed/performed             Past Medical History:  Diagnosis Date   ASHD (arteriosclerotic heart disease)    Chronic kidney disease    RENAL STONES   Diverticulitis    GERD (gastroesophageal reflux disease)    Hx of colonic polyps    Hypertension    Obesity    PUD (peptic ulcer disease)    Renal insufficiency     Past Surgical History:  Procedure Laterality Date   BYPASS GRAFT  08/16/1999   CARDIAC CATHETERIZATION  08/2005   NASAL ENDOSCOPY  2008   VIDEO BRONCHOSCOPY Bilateral 05/01/2018   Procedure: VIDEO BRONCHOSCOPY WITHOUT FLUORO;  Surgeon: Luciano Cutter, MD;  Location: [facility];  Service: Cardiopulmonary;  Laterality: Bilateral;    There were no vitals filed for this visit.   Subjective Assessment - 02/05/21 1333     Subjective Pt. indicated not having back specific pain increase. Pt. did report feeling numbness in Rt leg (like leg was asleep) when he got out of bed one today.  Stood 10 mins and took BC powder and it went away but Pt. stated his leg didn't feel like it would hold him when the numbness was there.    Limitations House hold activities;Walking;Standing;Lifting    Diagnostic tests xray on back    Patient Stated Goals Reduce pain    Currently in Pain? No/denies     Pain Score 0-No pain    Pain Onset More than a month ago                               [facility] Adult PT Treatment/Exercise - 02/05/21 0001       Neuro Re-ed    Neuro Re-ed Details  EC feet together 1 min, tandem stance 1 min x 2 bilateral, alt toe tapping x 15 bilateral 8 inch step, feet together on foam 1 min EO, 30 sec c head turns feet together on foam      Lumbar Exercises: Aerobic   Nustep Lvl 6 15 mins UE/LE SPM around 85-90      Lumbar Exercises: Machines for Strengthening   Leg Press Double leg 100 lbs 20x, SL 50 lbs 2 x 10 bilateral      Lumbar Exercises: Standing   Other Standing Lumbar Exercises standing lumbar extension x 5      Lumbar Exercises: Seated   Other Seated Lumbar Exercises 18 inch chair x 10 s UE                       PT Short Term Goals - 11/14/20 1352  PT SHORT TERM GOAL #1   Title Patient will demonstrate independent use of home exercise program to maintain progress from in clinic treatments.    Time 3    Period Weeks    Status Achieved    Target Date 11/13/20               PT Long Term Goals - 01/30/21 1354       PT LONG TERM GOAL #1   Title Patient will demonstrate/report pain at worst less than or equal to 2/10 to facilitate minimal limitation in daily activity secondary to pain symptoms.    Time 10    Period Weeks    Status On-going    Target Date 03/15/21      PT LONG TERM GOAL #2   Title Patient will demonstrate independent use of home exercise program to facilitate ability to maintain/progress functional gains from skilled physical therapy services.    Time 10    Period Weeks    Status On-going    Target Date 03/15/21      PT LONG TERM GOAL #3   Title Pt. will demonstrate FOTO outcome > or = 64% to indicated reduced disability due to condition.    Time 10    Period Weeks    Status On-going    Target Date 03/15/21      PT LONG TERM GOAL #4   Title Patient will demonstrate lumbar  extension 100 % WFL s symptoms to facilitate upright standing, walking posture at PLOF s limitation.    Time 10    Period Weeks    Status On-going    Target Date 03/15/21      PT LONG TERM GOAL #5   Title Pt. will demonstrate sit to stand transfers from 18 inch chair s UE assist and good control.    Time 10    Period Weeks    Status Achieved    Target Date --                   Plan - 02/05/21 1411     Clinical Impression Statement Lumbar mobility and symptom improvement continued to be tied to use of mobility in repeated lumbar mobility movements.  Pt. to benefit from continued strengthening of lower extermity (more so Rt vs. Lt weakness) as well as improved balance control to facilitate improved confidence in ambulation.    Personal Factors and Comorbidities Comorbidity 3+    Comorbidities HTN, history of dizziness, GERD, chronic kidney disease(stones),    Examination-Activity Limitations Sit;Bend;Stand;Locomotion Level;Transfers;Hygiene/Grooming    Examination-Participation Restrictions Community Activity;Driving;Interpersonal Relationship;Other   walking dogs   Stability/Clinical Decision Making Stable/Uncomplicated    Rehab Potential Good    PT Frequency 2x / week    PT Duration Other (comment)   10 weeks   PT Treatment/Interventions ADLs/Self Care Home Management;Cryotherapy;Electrical Stimulation;Iontophoresis 4mg /ml Dexamethasone;Moist Heat;Traction;Balance training;Therapeutic exercise;Therapeutic activities;Functional mobility training;Stair training;Gait training;DME Instruction;Ultrasound;Neuromuscular re-education;Patient/family education;Passive range of motion;Spinal Manipulations;Joint Manipulations;Dry needling;Taping;Manual techniques    PT Next Visit Plan Functional balance on compliant and non compliant surfaces, LE strengthening c lumbar mobility intervention as needed.    PT Home Exercise Plan [id number]    Consulted and Agree with Plan of Care Patient              Patient will benefit from skilled therapeutic intervention in order to improve the following deficits and impairments:  Abnormal gait, Decreased endurance, Hypomobility, Pain, Decreased strength, Decreased activity tolerance, Decreased balance, Decreased mobility, Difficulty walking, Improper  body mechanics, Impaired perceived functional ability, Postural dysfunction, Impaired flexibility, Decreased coordination, Decreased range of motion  Visit Diagnosis: Chronic low back pain without sciatica, unspecified back pain laterality  Difficulty in walking, not elsewhere classified     Problem List 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 01/18/2019   Sebaceous cyst 01/18/2019   Abdominal aortic atherosclerosis (HCC) 06/10/2017   Arthritis 06/10/2017   History of renal stone 07/25/2016   Benign prostatic hyperplasia with lower urinary tract symptoms 07/25/2016   Hypertension 02/25/2012   Renal insufficiency 02/25/2012   Gastroesophageal reflux disease without esophagitis 02/25/2012   ASHD (arteriosclerotic heart disease) 02/25/2012   History of gout 02/25/2012   History of colonic polyps 02/25/2012   Hyperlipidemia 02/19/2011   Statin intolerance 02/19/2011    Chyrel Masson, PT, DPT, OCS, ATC 02/05/21  2:17 PM    [facility] [address] [place], [place], [address] Phone: [phone]   Fax:  [phone]  Name: Chase Wright. MRN: [id number] Date of Birth: [birth year]

## 2021-02-13 ENCOUNTER — Encounter: Payer: Medicare Other | Admitting: Rehabilitative and Restorative Service Providers"

## 2021-02-16 ENCOUNTER — Other Ambulatory Visit: Payer: Self-pay | Admitting: Family Medicine

## 2021-02-16 DIAGNOSIS — Z8739 Personal history of other diseases of the musculoskeletal system and connective tissue: Secondary | ICD-10-CM

## 2021-02-19 ENCOUNTER — Encounter: Payer: Self-pay | Admitting: Pharmacist

## 2021-02-19 DIAGNOSIS — G720 Drug-induced myopathy: Secondary | ICD-10-CM

## 2021-02-19 NOTE — Progress Notes (Signed)
Per review of chart and payor information, patient has a diagnosis of clinical ASCVD but is not currently prescribed a statin.  Noted patient has history of statin intolerances due to muscle pain.  He has an upcoming appointment with his PCP on 02/21/2021.  If deemed therapeutically appropriate, patient could pass the measure or be removed from the measure with the following considerations:   -Consider initiating medication at a lower dose or reduced frequency (ex: 1x weekly, 2x weekly, or 3x weekly) if the patient has had prior statin intolerances, as this will still provide moderate-intensity statin coverage.     -Consider coding for past intolerance (required annually).  Patients with intolerance to statin therapy for reasons below can be coded for exclusion from Statin Use in Persons with Cardiovascular Disease (SPC) measure.    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

## 2021-02-20 ENCOUNTER — Encounter: Payer: Medicare Other | Admitting: Rehabilitative and Restorative Service Providers"

## 2021-02-21 ENCOUNTER — Other Ambulatory Visit: Payer: Self-pay

## 2021-02-21 ENCOUNTER — Encounter: Payer: Self-pay | Admitting: Family Medicine

## 2021-02-21 ENCOUNTER — Ambulatory Visit (INDEPENDENT_AMBULATORY_CARE_PROVIDER_SITE_OTHER): Payer: Medicare Other | Admitting: Family Medicine

## 2021-02-21 VITALS — BP 120/78 | HR 74 | Temp 99.2°F | Ht 63.0 in | Wt 232.0 lb

## 2021-02-21 DIAGNOSIS — I10 Essential (primary) hypertension: Secondary | ICD-10-CM

## 2021-02-21 DIAGNOSIS — I700 Atherosclerosis of aorta: Secondary | ICD-10-CM | POA: Diagnosis not present

## 2021-02-21 DIAGNOSIS — F329 Major depressive disorder, single episode, unspecified: Secondary | ICD-10-CM

## 2021-02-21 DIAGNOSIS — N289 Disorder of kidney and ureter, unspecified: Secondary | ICD-10-CM | POA: Diagnosis not present

## 2021-02-21 DIAGNOSIS — E785 Hyperlipidemia, unspecified: Secondary | ICD-10-CM | POA: Diagnosis not present

## 2021-02-21 DIAGNOSIS — Z951 Presence of aortocoronary bypass graft: Secondary | ICD-10-CM

## 2021-02-21 DIAGNOSIS — Z86010 Personal history of colonic polyps: Secondary | ICD-10-CM

## 2021-02-21 DIAGNOSIS — N3281 Overactive bladder: Secondary | ICD-10-CM | POA: Diagnosis not present

## 2021-02-21 DIAGNOSIS — Z789 Other specified health status: Secondary | ICD-10-CM

## 2021-02-21 DIAGNOSIS — Z8739 Personal history of other diseases of the musculoskeletal system and connective tissue: Secondary | ICD-10-CM | POA: Diagnosis not present

## 2021-02-21 DIAGNOSIS — I2510 Atherosclerotic heart disease of native coronary artery without angina pectoris: Secondary | ICD-10-CM

## 2021-02-21 DIAGNOSIS — K219 Gastro-esophageal reflux disease without esophagitis: Secondary | ICD-10-CM | POA: Diagnosis not present

## 2021-02-21 DIAGNOSIS — Z1211 Encounter for screening for malignant neoplasm of colon: Secondary | ICD-10-CM

## 2021-02-21 DIAGNOSIS — Z0000 Encounter for general adult medical examination without abnormal findings: Secondary | ICD-10-CM

## 2021-02-21 NOTE — Progress Notes (Signed)
Chase Wright. is a 75 y.o. male who presents for annual wellness visit,CPE and follow-up on chronic medical conditions.  He has underlying heart disease and is seen regularly by cardiology.  He does have evidence of aortic atherosclerosis.  He has had no chest pain, shortness of breath PND or DOE.  He is statin intolerant and has also tried Repatha and was not able to tolerate that either.  At one point he tried Vytorin and had difficulty with that as well.  His main complaint today is continued difficulty with back pain as well as foot tingling and questionable numbness as well as balance issues.  He states that it is interfering with his driving as he cannot feel the pedals.  He is in the process of having an MRI done by Dr. Alvester Morin.  He does have reflux disease and uses Pepcid with good result.  Psychologically he is having difficulty dealing with his inability to be as physically active as he would like and interact with people.  He also complains of intermittent difficulty with constipation and diarrhea.  He has been using Pepto-Bismol to help with this and is also intermittently taken a bulk laxative.  He does have a history of gout was not had difficulty with that in several years.  He has a history of colonic polyps but apparently they were benign in nature.  Presently he is not having any urinary symptoms.   Immunizations and Health Maintenance Immunization History  Administered Date(s) Administered   DTaP 07/24/1992   Influenza Split 01/06/2012, 01/17/2013, 02/22/2014   Influenza Whole 01/19/1991, 02/11/2006, 12/31/2007   Influenza, High Dose Seasonal PF 12/09/2013, 11/20/2016, 12/24/2017, 12/01/2018, 12/01/2018   Influenza-Unspecified 01/06/2015, 12/31/2015, 11/20/2016, 11/19/2019, 01/08/2021   PFIZER(Purple Top)SARS-COV-2 Vaccination 06/08/2019, 07/06/2019, 01/06/2020, 07/10/2020, 01/08/2021   Pneumococcal Conjugate-13 10/24/2016   Pneumococcal Polysaccharide-23 10/06/2002, 02/25/2012    Tdap 01/06/2007, 11/20/2016, 07/11/2020   Zoster Recombinat (Shingrix) 11/20/2016, 05/13/2017   Zoster, Live 10/06/2002   Health Maintenance Due  Topic Date Due   Pneumonia Vaccine 49+ Years old (3 - PPSV23 if available, else PCV20) 10/24/2017   Fecal DNA (Cologuard)  10/18/2019    Last colonoscopy:  negative cologuard 10/17/16 Last PSA: 01/27/20 (3.1) Dentist: Q year Ophtho: Q year  Exercise: P/T once a week  Other doctors caring for patient include: [facility] crafter at [facility] and Dr. Emily Filbert               Dr. Elease [facility]             Dr. Everardo All [facility]             [facility]  Advanced Directives: Does Patient Have a Medical Advance Directive?: Yes Type of Advance Directive: Living will, Healthcare Power of Attorney, Out of facility DNR (pink MOST or yellow form) Does patient want to make changes to medical advance directive?: No - Patient declined Copy of Healthcare Power of Attorney in Chart?: Yes - validated most recent copy scanned in chart (See row information)  Depression screen:  See questionnaire below.     Depression screen [facility] 2/9 02/21/2021 08/01/2020 01/27/2020 01/18/2019 11/04/2017  Decreased Interest 0 0 0 0 0  Down, Depressed, Hopeless 0 3 0 0 0  PHQ - 2 Score 0 3 0 0 0  Altered sleeping - 3 - - -  Tired, decreased energy - 3 - - -  Change in appetite - 0 - - -  Feeling bad or failure about yourself  - 0 - - -  Trouble concentrating - 0 - - -  Moving slowly or fidgety/restless - 0 - - -  Suicidal thoughts - 0 - - -  PHQ-9 Score - 9 - - -  Difficult doing work/chores - Somewhat difficult - - -    Fall Screen: See Questionaire below.   Fall Risk  02/21/2021 08/01/2020 01/27/2020 01/18/2019 10/24/2016  Falls in the past year? 1 1 1  0 No  Number falls in past yr: 1 1 1  - -  Injury with Fall? 1 1 0 - -  Risk for fall due to : Impaired balance/gait Other (Comment) - - -  Risk for fall due to: Comment - tripped - - -  Follow up Falls evaluation  completed;Education provided Falls evaluation completed - - -    ADL screen:  See questionnaire below.  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) Does the patient have difficulty seeing, even when wearing glasses/contacts?: No Does the patient have difficulty concentrating, remembering, or making decisions?: No Does the patient have difficulty walking or climbing stairs?: Yes Does the patient have difficulty dressing or bathing?: No Does the patient have difficulty doing errands alone such as visiting a doctor's office or shopping?: No   Review of Systems  Constitutional: -, -unexpected weight change, -anorexia, -fatigue Allergy: -sneezing, -itching, -congestion Dermatology: denies changing moles, rash, lumps ENT: -runny nose, -ear pain, -sore throat,  Cardiology:  -chest pain, -palpitations, -orthopnea, Respiratory: -cough, -shortness of breath, -dyspnea on exertion, -wheezing,   Hematology: -bleeding or bruising problems Musculoskeletal: -arthralgias, -myalgias, -joint swelling, -back pain, - Ophthalmology: -vision changes,  Urology: -dysuria, -difficulty urinating,  -urinary frequency, -urgency, incontinence Neurology: -, -numbness, , -memory loss, -falls, -dizziness    PHYSICAL EXAM: General Appearance: Alert, cooperative, no distress, appears stated age Head: Normocephalic, without obvious abnormality, atraumatic Eyes: PERRL, conjunctiva/corneas clear, EOM's intact,  Ears: Normal TM's and external ear canals Nose: Nares normal, mucosa normal, no drainage or sinus   tenderness Throat: Lips, mucosa, and tongue normal; teeth and gums normal Neck: Supple, no lymphadenopathy, thyroid:no enlargement/tenderness/nodules; no carotid bruit or JVD Lungs: Clear to auscultation bilaterally without wheezes, rales or ronchi; respirations unlabored Heart: Regular rate and rhythm, S1 and S2 normal, no murmur, rub or gallop Skin: Skin color,  texture, turgor normal, no rashes or lesions Lymph nodes: Cervical, supraclavicular, and axillary nodes normal Neurologic: CNII-XII intact, normal strength, sensation and gait; reflexes 2+ and symmetric throughout   Psych: Normal mood, affect, hygiene and grooming  ASSESSMENT/PLAN: ASHD (arteriosclerotic heart disease)  Hx of CABG  Morbid obesity (HCC)  Primary hypertension  Gastroesophageal reflux disease without esophagitis  Hyperlipidemia, unspecified hyperlipidemia type  History of gout  Renal insufficiency  History of colonic polyps  Abdominal aortic atherosclerosis (HCC)  OAB (overactive bladder)  Reactive depression  Routine general medical examination at a health care facility  Statin intolerance  Screening for colon cancer - Plan: Cologuard Recommend he try MiraLAX to help with his alternating constipation and diarrhea and we will pursue this further if we need to.  Cologuard ordered.  Continue on Pepcid for his reflux symptoms.  If the MRI and referral to Dr. is noncontributory, neurologic evaluation might be needed.     Immunization recommendations discussed.  Colonoscopy recommendations reviewed.   Medicare Attestation I have personally reviewed: The patient's medical and social history Their use of alcohol, tobacco or illicit drugs Their current medications and supplements The patient's functional ability including ADLs,fall risks, home safety risks,  cognitive, and hearing and visual impairment Diet and physical activities Evidence for depression or mood disorders  The patient's weight, height, and BMI have been recorded in the chart.  I have made referrals, counseling, and provided education to the patient based on review of the above and I have provided the patient with a written personalized care plan for preventive services.     Sharlot Gowda, MD   02/21/2021

## 2021-02-21 NOTE — Patient Instructions (Signed)
  Mr. Caras , Thank you for taking time to come for your Medicare Wellness Visit. I appreciate your ongoing commitment to your health goals. Please review the following plan we discussed and let me know if I can assist you in the future.   These are the goals we discussed: Use MiraLAX daily to get on a regular bowel habit.  Use a probiotic   This is a list of the screening recommended for you and due dates:  Health Maintenance  Topic Date Due   Cologuard (Stool DNA test)  10/18/2019   Tetanus Vaccine  07/12/2030   Flu Shot  Completed   COVID-19 Vaccine  Completed   Hepatitis C Screening: USPSTF Recommendation to screen - Ages 18-79 yo.  Completed   Zoster (Shingles) Vaccine  Completed   HPV Vaccine  Aged Out   Pneumonia Vaccine  Discontinued

## 2021-02-22 ENCOUNTER — Ambulatory Visit
Admission: RE | Admit: 2021-02-22 | Discharge: 2021-02-22 | Disposition: A | Discharge status: Home / Self Care | Payer: Medicare Other | Source: Ambulatory Visit | Attending: Physical Medicine and Rehabilitation | Admitting: Physical Medicine and Rehabilitation

## 2021-02-22 DIAGNOSIS — M48061 Spinal stenosis, lumbar region without neurogenic claudication: Secondary | ICD-10-CM | POA: Diagnosis not present

## 2021-02-22 DIAGNOSIS — M47816 Spondylosis without myelopathy or radiculopathy, lumbar region: Secondary | ICD-10-CM | POA: Diagnosis not present

## 2021-02-22 DIAGNOSIS — G8929 Other chronic pain: Secondary | ICD-10-CM

## 2021-02-22 DIAGNOSIS — M4807 Spinal stenosis, lumbosacral region: Secondary | ICD-10-CM | POA: Diagnosis not present

## 2021-02-22 DIAGNOSIS — M545 Low back pain, unspecified: Secondary | ICD-10-CM | POA: Diagnosis not present

## 2021-02-26 ENCOUNTER — Ambulatory Visit: Payer: Medicare Other | Admitting: Rehabilitative and Restorative Service Providers"

## 2021-02-26 ENCOUNTER — Encounter: Payer: Self-pay | Admitting: Rehabilitative and Restorative Service Providers"

## 2021-02-26 ENCOUNTER — Telehealth: Payer: Self-pay

## 2021-02-26 ENCOUNTER — Other Ambulatory Visit: Payer: Self-pay

## 2021-02-26 DIAGNOSIS — R262 Difficulty in walking, not elsewhere classified: Secondary | ICD-10-CM

## 2021-02-26 DIAGNOSIS — G8929 Other chronic pain: Secondary | ICD-10-CM | POA: Diagnosis not present

## 2021-02-26 DIAGNOSIS — M545 Low back pain, unspecified: Secondary | ICD-10-CM | POA: Diagnosis not present

## 2021-02-26 NOTE — Telephone Encounter (Signed)
Pt was advised [facility] 

## 2021-02-26 NOTE — Therapy (Addendum)
[facility] [address] [place], [place], [address] Phone: [phone]   Fax:  [phone]  Physical Therapy Treatment/ Discharge   Patient Details  Name: Chase Wright. MRN: [id number] Date of Birth: 1945-10-04 Referring Provider (PT): Dr. Jill Alexanders MD   Encounter Date: 02/26/2021    PT End of Session - 02/26/21 1325     Visit Number 16    Number of Visits 30    Date for PT Re-Evaluation 03/15/21    Authorization Type U[employer] Medicare    Progress Note Due on Visit 20    PT Start Time 1325    PT Stop Time 1408    PT Time Calculation (min) 43 min    Activity Tolerance Patient tolerated treatment well    Behavior During Therapy [facility] for tasks assessed/performed             Past Medical History:  Diagnosis Date   ASHD (arteriosclerotic heart disease)    Chronic kidney disease    RENAL STONES   Diverticulitis    GERD (gastroesophageal reflux disease)    Hx of colonic polyps    Hypertension    Obesity    PUD (peptic ulcer disease)    Renal insufficiency     Past Surgical History:  Procedure Laterality Date   BYPASS GRAFT  08/16/1999   CARDIAC CATHETERIZATION  08/2005   NASAL ENDOSCOPY  2008   VIDEO BRONCHOSCOPY Bilateral 05/01/2018   Procedure: VIDEO BRONCHOSCOPY WITHOUT FLUORO;  Surgeon: Margaretha Seeds, MD;  Location: [facility];  Service: Cardiopulmonary;  Laterality: Bilateral;    There were no vitals filed for this visit.   Subjective Assessment - 02/26/21 1331     Subjective Pt. indicated a fall in apartment when walking around.  No injury reported.  Pt. indicated having quick shooting pain from foot up leg (has occurred on each leg but Lt more than Rt).  Pt. indicated he got a cane to start using which is helpful.  Pt. indicated MRI was performed, awaiting MD result visit.    Limitations House hold activities;Walking;Standing;Lifting    Diagnostic tests xray on back    Patient Stated Goals Reduce pain     Currently in Pain? No/denies    Pain Score 0-No pain    Pain Onset More than a month ago                [facility] PT Assessment - 02/26/21 0001       Assessment   Medical Diagnosis Low back pain    Referring Provider (PT) Dr. Jill Alexanders MD    Onset Date/Surgical Date 05/09/20    Hand Dominance Right      Ambulation/Gait   Gait Comments Use of cane in Rt UE c improved step length and control vs. independent                           [facility] Adult PT Treatment/Exercise - 02/26/21 0001       Neuro Re-ed    Neuro Re-ed Details  Eyes open feet together 1 min, eyes open feet together head turns 1 min, eyes closed feet together 30 sec.  Modified tandem stance eyes open 1 min x 1 bilateral, eyes closed 30 sec x 1 bilateral.  Variable SBA to min A for loss of balance as desired.      Lumbar Exercises: Aerobic   Nustep Lvl 6 20 mins UE/LE  Lumbar Exercises: Machines for Strengthening   Leg Press Double leg 100 lbs 20x, SL 50 lbs 20x bilateral      Lumbar Exercises: Standing   Other Standing Lumbar Exercises standing lumbar extension x 5                       PT Short Term Goals - 11/14/20 1352       PT SHORT TERM GOAL #1   Title Patient will demonstrate independent use of home exercise program to maintain progress from in clinic treatments.    Time 3    Period Weeks    Status Achieved    Target Date 11/13/20               PT Long Term Goals - 01/30/21 1354       PT LONG TERM GOAL #1   Title Patient will demonstrate/report pain at worst less than or equal to 2/10 to facilitate minimal limitation in daily activity secondary to pain symptoms.    Time 10    Period Weeks    Status On-going    Target Date 03/15/21      PT LONG TERM GOAL #2   Title Patient will demonstrate independent use of home exercise program to facilitate ability to maintain/progress functional gains from skilled physical therapy services.    Time 10    Period  Weeks    Status On-going    Target Date 03/15/21      PT LONG TERM GOAL #3   Title Pt. will demonstrate FOTO outcome > or = 64% to indicated reduced disability due to condition.    Time 10    Period Weeks    Status On-going    Target Date 03/15/21      PT LONG TERM GOAL #4   Title Patient will demonstrate lumbar extension 100 % WFL s symptoms to facilitate upright standing, walking posture at PLOF s limitation.    Time 10    Period Weeks    Status On-going    Target Date 03/15/21      PT LONG TERM GOAL #5   Title Pt. will demonstrate sit to stand transfers from 18 inch chair s UE assist and good control.    Time 10    Period Weeks    Status Achieved    Target Date --                   Plan - 02/26/21 1418     Clinical Impression Statement Pt. has demonstrated good overall knowledge of HEP and shown positive symptom reduction in lumbar region from use to this point. Overall mobility has improved but times of increased mobility deficits create negative effects on function.  Pt. has also demonstrated variable levels of difficulty c overall balance control with instances of difficulty in controlling movmeent including 1 fall reported recently.  Pt. to have MD visit for MRI results.  Pt. requested hold on in clinic activity until MD visit.    Personal Factors and Comorbidities Comorbidity 3+    Comorbidities HTN, history of dizziness, GERD, chronic kidney disease(stones),    Examination-Activity Limitations Sit;Bend;Stand;Locomotion Level;Transfers;Hygiene/Grooming    Examination-Participation Restrictions Community Activity;Driving;Interpersonal Relationship;Other   walking dogs   Stability/Clinical Decision Making Stable/Uncomplicated    Rehab Potential Good    PT Frequency 2x / week    PT Duration Other (comment)   10 weeks   PT Treatment/Interventions ADLs/Self Care Home Management;Cryotherapy;Electrical Stimulation;Iontophoresis 30m/ml Dexamethasone;Moist  Heat;Traction;Balance training;Therapeutic exercise;Therapeutic activities;Functional mobility training;Stair training;Gait training;DME Instruction;Ultrasound;Neuromuscular re-education;Patient/family education;Passive range of motion;Spinal Manipulations;Joint Manipulations;Dry needling;Taping;Manual techniques    PT Next Visit Plan Return c recertification as desired.  Lumbar mobility gains, LE strength and balance.    PT Home Exercise Plan [id number]    Consulted and Agree with Plan of Care Patient             Patient will benefit from skilled therapeutic intervention in order to improve the following deficits and impairments:  Abnormal gait, Decreased endurance, Hypomobility, Pain, Decreased strength, Decreased activity tolerance, Decreased balance, Decreased mobility, Difficulty walking, Improper body mechanics, Impaired perceived functional ability, Postural dysfunction, Impaired flexibility, Decreased coordination, Decreased range of motion  Visit Diagnosis: Chronic low back pain without sciatica, unspecified back pain laterality  Difficulty in walking, not elsewhere classified     Problem List 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   Morbid obesity ([place]) 01/27/2020   OAB (overactive bladder) 01/27/2020   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 01/18/2019   Sebaceous cyst 01/18/2019   Abdominal aortic atherosclerosis ([place]) 06/10/2017   Arthritis 06/10/2017   History of renal stone 07/25/2016   Benign prostatic hyperplasia with lower urinary tract symptoms 07/25/2016   Hypertension 02/25/2012   Renal insufficiency 02/25/2012   Gastroesophageal reflux disease without esophagitis 02/25/2012   ASHD (arteriosclerotic heart disease) 02/25/2012   History of gout 02/25/2012   History of colonic polyps 02/25/2012    Hyperlipidemia 02/19/2011   Statin intolerance 02/19/2011    Scot Jun, PT, DPT, OCS, ATC 02/26/21  2:22 PM  PHYSICAL THERAPY DISCHARGE SUMMARY  Visits from Start of Care: 16  Current functional level related to goals / functional outcomes: See note   Remaining deficits: See note   Education / Equipment: HEP   Patient agrees to discharge. Patient goals were partially met. Patient is being discharged due to not returning since the last visit.  Scot Jun, PT, DPT, OCS, ATC 04/10/21  1:14 PM     [facility] [address] [place], [place], [address] Phone: [phone]   Fax:  [phone]  Name: Chase Wright. MRN: [id number] Date of Birth: [birth year]

## 2021-02-26 NOTE — Telephone Encounter (Signed)
Pt called to advise his MRI was done on Thursday of last week and he was advised that you would take a look at it for him at his last OV. Please advise PT or me of results. He  is really worried.   KH

## 2021-03-06 ENCOUNTER — Encounter: Payer: Self-pay | Admitting: Physical Medicine and Rehabilitation

## 2021-03-06 ENCOUNTER — Ambulatory Visit: Payer: Medicare Other | Admitting: Physical Medicine and Rehabilitation

## 2021-03-06 ENCOUNTER — Other Ambulatory Visit: Payer: Self-pay

## 2021-03-06 VITALS — BP 163/83 | HR 80

## 2021-03-06 DIAGNOSIS — M4726 Other spondylosis with radiculopathy, lumbar region: Secondary | ICD-10-CM | POA: Diagnosis not present

## 2021-03-06 DIAGNOSIS — M5416 Radiculopathy, lumbar region: Secondary | ICD-10-CM

## 2021-03-06 DIAGNOSIS — M47816 Spondylosis without myelopathy or radiculopathy, lumbar region: Secondary | ICD-10-CM | POA: Diagnosis not present

## 2021-03-06 DIAGNOSIS — M48062 Spinal stenosis, lumbar region with neurogenic claudication: Secondary | ICD-10-CM | POA: Diagnosis not present

## 2021-03-06 NOTE — Progress Notes (Signed)
Pt state lower back pain that travel to both foot. Pt state his pain shoots up his legs and then his legs goes numbness then he will fall. Pt here for an MRI results  Numeric Pain Rating Scale and Functional Assessment Average Pain 8   In the last MONTH (on 0-10 scale) has pain interfered with the following?  1. General activity like being  able to carry out your everyday physical activities such as walking, climbing stairs, carrying groceries, or moving a chair?  Rating(10)

## 2021-03-06 NOTE — Progress Notes (Signed)
Chase Wright. - 75 y.o. male MRN [id number]  Date of birth: Feb 08, 1946  Office Visit Note: Visit Date: 03/06/2021 PCP: Denita Lung, MD Referred by: Denita Lung, MD  Subjective: Chief Complaint  Patient presents with   Lower Back - Pain   Left Foot - Numbness, Pain   Right Foot - Pain, Numbness   HPI: Chase Wright. is a 75 y.o. male who comes in today for evaluation of chronic, worsening and severe bilateral lower back pain. Patient also has a new complaint of numbness and tingling to bilateral legs that started several weeks ago. Patient reports pain is exacerbated by prolonged standing and walking, describes as a soreness and numbness sensation, currently rates as 8 out of 10. Patient reports some relief of pain with rest and medications. Patient is also currently attending formal physical therapy with our in-house team and report some pain relief with these treatments, however pain relief is short lived. We recently obtained a new lumbar MRI that does exhibit multi-level facet hypertrophy, severe spinal canal stenosis at L4-L5 and moderate to severe spinal canal stenosis at L5-S1.  Patient states is he growing concerned about his worsening symptoms and has recently started using a cane to assist with ambulation. Patient reports he has been dealing with chronic bilateral lower back since February, however the recent numbness and tingling in his legs has caused him to fall at home multiple times. Patient states the numbness and tingling in his legs is causing him to lose his balance and has fallen in his bathroom several times, he currently denies any injuries. Patient states he lives by himself and is responsible for taking care of several dogs. Patient denies bowel/bladder incontinence.  Review of Systems  Neurological:  Positive for tingling and sensory change. Negative for focal weakness and weakness.  All other systems reviewed and are negative. Otherwise per  HPI.  Assessment & Plan: Visit Diagnoses:    ICD-10-CM   1. Lumbar radiculopathy  M54.16 Ambulatory referral to Physical Medicine Rehab    2. Other spondylosis with radiculopathy, lumbar region  M47.26     3. Spinal stenosis of lumbar region with neurogenic claudication  M48.062     4. Facet hypertrophy of lumbar region  M47.816        Plan: Findings:  Chronic, worsening and severe bilateral lower back pain. He is also now experiencing numbness/tingling to bilateral lower extremities. Patient continues to have excruciating and debilitating pain despite good conservative therapies such as formal physical therapy, rest and medications. We did review patient's recent MRI with him today in detail using images and spine model. Patient's recent lumbar MRI does exhibit severe spinal canal stenosis at L4-L5 and moderate to severe spinal canal stenosis at L5-S1. Patient's clinical presentation and exam is consistent with neurogenic claudication associated with spinal canal stenosis. He does have good strength noted to bilateral lower extremities upon exam. No foot drop noted. We believe the next step is to perform a diagnostic and hopefully therapeutic bilateral L4 transforaminal epidural steroid injection under fluoroscopic guidance. We also spoke with patient about surgical consultation to discuss other treatment options. Patient does voice concerns about having spine surgery as he had a bad experience with previous surgical procedures and sedation. He would like to try the epidural injection before proceeding with surgical consult. Patient encouraged to be active as tolerated and to continue formal physical therapy. No red flag symptoms noted upon exam today.    Meds & Orders:  No orders of the defined types were placed in this encounter.   Orders Placed This Encounter  Procedures   Ambulatory referral to Physical Medicine Rehab    Follow-up: Return for Bilateral L4 transforaminal epidural steroid  injection.   Procedures: No procedures performed      Clinical History: EXAM: MRI LUMBAR SPINE WITHOUT CONTRAST   TECHNIQUE: Multiplanar, multisequence MR imaging of the lumbar spine was performed. No intravenous contrast was administered.   COMPARISON:  X-ray lumbar 09/29/2020.   FINDINGS: Segmentation:  Standard.   Alignment: There is 3 mm grade 1 anterolisthesis L5 on S1. There is 2 mm grade 1 anterolisthesis L4 on L5. Trace retrolisthesis at L1-2 and L2-3.   Vertebrae: Prominent discogenic endplate marrow changes at L5-S1, and to a lesser degree at L2-3 and L4-5. No evidence of fracture. No evidence of discitis. No suspicious marrow replacing bone lesion.   Conus medullaris and cauda equina: Conus extends to the L1 level. Conus and cauda equina appear normal.   Paraspinal and other soft tissues: Cortically based T2 hyperintense lesionswithin the bilateral kidneys, incompletely characterized, but most likely represent cysts.   Disc levels:   T12-L1: Shallow central disc protrusion. No foraminal or canal stenosis.   L1-L2: Mild disc bulge.  No foraminal or canal stenosis.   L2-L3: Diffuse disc bulge with central protrusion components with slight caudal extension. Mild bilateral facet hypertrophy. Mild-moderate canal stenosis and mild left foraminal stenosis.   L3-L4: Diffuse disc bulge with small central protrusion with cranial extension. Mild-moderate facet arthropathy. Prominence of the posterior epidural fat. Findings result in mild-moderate canal stenosis and mild bilateral foraminal stenosis.   L4-L5: Anterolisthesis with disc uncovering and diffuse disc bulge. Severe bilateral facet arthropathy and ligamentum flavum buckling. Trace bilateral facet joint effusions. Severe canal stenosis with moderate bilateral foraminal stenosis.   L5-S1: Anterolisthesis with disc uncovering and diffuse disc bulge. Severe bilateral facet arthropathy with trace bilateral  facet joint effusions. Moderate-to-severe canal stenosis with moderate-to-severe bilateral foraminal stenosis.   IMPRESSION: Multilevel lumbar spondylosis, most pronounced at the L4-5 and L5-S1 levels where there is severe canal stenosis and moderate bilateral foraminal stenosis at L4-5 and moderate-to-severe canal and biforaminal stenosis at L5-S1.     Electronically Signed   By: Davina Poke D.O.   On: 02/23/2021 10:41   He reports that he has never smoked. He has never used smokeless tobacco. No results for input(s): HGBA1C, LABURIC in the last 8760 hours.  Objective:  VS:  HT:    WT:   BMI:     BP:(!) 163/83  HR:80bpm  TEMP: ( )  RESP:  Physical Exam Vitals and nursing note reviewed.  HENT:     Head: Normocephalic and atraumatic.     Right Ear: External ear normal.     Left Ear: External ear normal.     Nose: Nose normal.     Mouth/Throat:     Mouth: Mucous membranes are moist.  Eyes:     Extraocular Movements: Extraocular movements intact.  Cardiovascular:     Rate and Rhythm: Normal rate.     Pulses: Normal pulses.  Pulmonary:     Effort: Pulmonary effort is normal.  Abdominal:     General: Abdomen is flat. There is no distension.  Musculoskeletal:        General: Tenderness present.     Cervical back: Normal range of motion.     Comments: Pt is slow to rise from seated position to standing. Good lumbar range of  motion. Strong distal strength without clonus, no pain upon palpation of greater trochanters. Sensation intact bilaterally. Dysesthesias noted to bilateral L4 and L5 dermatomes. Ambulates with walker, gait slow and unsteady.  Skin:    General: Skin is warm and dry.     Capillary Refill: Capillary refill takes less than 2 seconds.  Neurological:     Mental Status: He is alert.     Gait: Gait abnormal.  Psychiatric:        Mood and Affect: Mood normal.    Ortho Exam  Imaging: No results found.  Past Medical/Family/Surgical/Social  History: Medications & Allergies reviewed per EMR, new medications updated. 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 01/18/2019   Sebaceous cyst 01/18/2019   Abdominal aortic atherosclerosis (HCC) 06/10/2017   Arthritis 06/10/2017   History of renal stone 07/25/2016   Benign prostatic hyperplasia with lower urinary tract symptoms 07/25/2016   Hypertension 02/25/2012   Renal insufficiency 02/25/2012   Gastroesophageal reflux disease without esophagitis 02/25/2012   ASHD (arteriosclerotic heart disease) 02/25/2012   History of gout 02/25/2012   History of colonic polyps 02/25/2012   Hyperlipidemia 02/19/2011   Statin intolerance 02/19/2011   Past Medical History:  Diagnosis Date   ASHD (arteriosclerotic heart disease)    Chronic kidney disease    RENAL STONES   Diverticulitis    GERD (gastroesophageal reflux disease)    Hx of colonic polyps    Hypertension    Obesity    PUD (peptic ulcer disease)    Renal insufficiency    Family History  Problem Relation Age of Onset   Diabetes Mother    Prostate cancer Father 41   Colon cancer Neg Hx    Past Surgical History:  Procedure Laterality Date   BYPASS GRAFT  08/16/1999   CARDIAC CATHETERIZATION  08/2005   NASAL ENDOSCOPY  2008   VIDEO BRONCHOSCOPY Bilateral 05/01/2018   Procedure: VIDEO BRONCHOSCOPY WITHOUT FLUORO;  Surgeon: Luciano Cutter, MD;  Location: [facility];  Service: Cardiopulmonary;  Laterality: Bilateral;   Social History   Occupational History   Not on file  Tobacco Use   Smoking status: Never   Smokeless tobacco: Never  Vaping Use   Vaping Use: Never used  Substance and Sexual Activity   Alcohol use: Yes    Comment: rare beer    Drug use: No   Sexual activity: Not Currently

## 2021-03-09 DIAGNOSIS — Z1211 Encounter for screening for malignant neoplasm of colon: Secondary | ICD-10-CM | POA: Diagnosis not present

## 2021-03-13 ENCOUNTER — Encounter: Payer: Medicare Other | Admitting: Rehabilitative and Restorative Service Providers"

## 2021-03-13 ENCOUNTER — Other Ambulatory Visit: Payer: Self-pay | Admitting: Family Medicine

## 2021-03-13 DIAGNOSIS — N401 Enlarged prostate with lower urinary tract symptoms: Secondary | ICD-10-CM

## 2021-03-14 LAB — COLOGUARD: COLOGUARD: NEGATIVE

## 2021-03-15 ENCOUNTER — Telehealth: Payer: Self-pay | Admitting: Physical Medicine and Rehabilitation

## 2021-03-15 ENCOUNTER — Ambulatory Visit: Payer: Medicare Other | Admitting: Physical Medicine and Rehabilitation

## 2021-03-15 NOTE — Telephone Encounter (Signed)
Pt called stating he needs to cancel his appt that was currently set for today but will CB to R/S.

## 2021-03-15 NOTE — Telephone Encounter (Signed)
Appt for today cancelled.

## 2021-03-28 ENCOUNTER — Encounter: Payer: Self-pay | Admitting: Physical Medicine and Rehabilitation

## 2021-03-28 ENCOUNTER — Ambulatory Visit: Payer: Self-pay

## 2021-03-28 ENCOUNTER — Ambulatory Visit: Payer: Medicare Other | Admitting: Physical Medicine and Rehabilitation

## 2021-03-28 ENCOUNTER — Other Ambulatory Visit: Payer: Self-pay

## 2021-03-28 DIAGNOSIS — M5416 Radiculopathy, lumbar region: Secondary | ICD-10-CM

## 2021-03-28 MED ORDER — METHYLPREDNISOLONE ACETATE 80 MG/ML IJ SUSP
80.0000 mg | Freq: Once | INTRAMUSCULAR | Status: AC
  Administered 2021-03-28: 15:00:00 80 mg

## 2021-03-28 NOTE — Progress Notes (Unsigned)
The patient is coming in today and is listed on the schedule as a hip injection but this clearly is a reschedule of a bilateral L4 transforaminal injection.  He had difficulty obtaining a ride the last time and at this point had decided I think to use [place] or taxi.  I am not sure how it got changed to hip injection but it clearly should be a bilateral L4 transforaminal injection and he should not be having a driver when he comes.

## 2021-03-28 NOTE — Progress Notes (Signed)
Pt state lower back pain that travels to both legs. Pt state walking and standing makes the pain worse. Pt state he takes pain meds to help ease her pain.  Numeric Pain Rating Scale and Functional Assessment Average Pain 8   In the last MONTH (on 0-10 scale) has pain interfered with the following?  1. General activity like being  able to carry out your everyday physical activities such as walking, climbing stairs, carrying groceries, or moving a chair?  Rating(9)   +Driver, -BT, -Dye Allergies.

## 2021-03-28 NOTE — Patient Instructions (Signed)

## 2021-05-02 NOTE — Procedures (Signed)
Lumbosacral Transforaminal Epidural Steroid Injection - Sub-Pedicular Approach with Fluoroscopic Guidance  Patient: Chase Wright.      Date of Birth: 02/20/1946 MRN: [id number] PCP: Ronnald Nian, MD      Visit Date: 03/28/2021   Universal Protocol:    Date/Time: 03/28/2021  Consent Given By: the patient  Position: PRONE  Additional Comments: Vital signs were monitored before and after the procedure. Patient was prepped and draped in the usual sterile fashion. The correct patient, procedure, and site was verified.   Injection Procedure Details:   Procedure diagnoses: Lumbar radiculopathy [M54.16]    Meds Administered:  Meds ordered this encounter  Medications   methylPREDNISolone acetate (DEPO-MEDROL) injection 80 mg    Laterality: Bilateral  Location/Site: L4  Needle:5.0 in., 22 ga.  Short bevel or Quincke spinal needle  Needle Placement: Transforaminal  Findings:    -Comments: Excellent flow of contrast along the nerve, nerve root and into the epidural space.  Procedure Details: After squaring off the end-plates to get a true AP view, the C-arm was positioned so that an oblique view of the foramen as noted above was visualized. The target area is just inferior to the "nose of the scotty dog" or sub pedicular. The soft tissues overlying this structure were infiltrated with 2-3 ml. of 1% Lidocaine without Epinephrine.  The spinal needle was inserted toward the target using a "trajectory" view along the fluoroscope beam.  Under AP and lateral visualization, the needle was advanced so it did not puncture dura and was located close the 6 O'Clock position of the pedical in AP tracterory. Biplanar projections were used to confirm position. Aspiration was confirmed to be negative for CSF and/or blood. A 1-2 ml. volume of Isovue-250 was injected and flow of contrast was noted at each level. Radiographs were obtained for documentation purposes.   After attaining the  desired flow of contrast documented above, a 0.5 to 1.0 ml test dose of 0.25% Marcaine was injected into each respective transforaminal space.  The patient was observed for 90 seconds post injection.  After no sensory deficits were reported, and normal lower extremity motor function was noted,   the above injectate was administered so that equal amounts of the injectate were placed at each foramen (level) into the transforaminal epidural space.   Additional Comments:  The patient tolerated the procedure well Dressing: 2 x 2 sterile gauze and Band-Aid    Post-procedure details: Patient was observed during the procedure. Post-procedure instructions were reviewed.  Patient left the clinic in stable condition.

## 2021-05-02 NOTE — Progress Notes (Signed)
Chase Wright. - 76 y.o. male MRN [id number]  Date of birth: May 13, 1945  Office Visit Note: Visit Date: 03/28/2021 PCP: Denita Lung, MD Referred by: Denita Lung, MD  Subjective: Chief Complaint  Patient presents with   Lower Back - Pain   Left Leg - Pain   Right Leg - Pain   HPI:  Chase Wright. is a 76 y.o. male who comes in today at the request of Barnet Pall, FNP for planned Bilateral L4-5 Lumbar Transforaminal epidural steroid injection with fluoroscopic guidance.  The patient has failed conservative care including home exercise, medications, time and activity modification.  This injection will be diagnostic and hopefully therapeutic.  Please see requesting physician notes for further details and justification. MRI reviewed with images and spine model.  MRI reviewed in the note below.   ROS Otherwise per HPI.  Assessment & Plan: Visit Diagnoses:    ICD-10-CM   1. Lumbar radiculopathy  M54.16 XR C-ARM NO REPORT    Epidural Steroid injection    methylPREDNISolone acetate (DEPO-MEDROL) injection 80 mg      Plan: No additional findings.   Meds & Orders:  Meds ordered this encounter  Medications   methylPREDNISolone acetate (DEPO-MEDROL) injection 80 mg    Orders Placed This Encounter  Procedures   XR C-ARM NO REPORT   Epidural Steroid injection    Follow-up: Return if symptoms worsen or fail to improve.   Procedures: No procedures performed  Lumbosacral Transforaminal Epidural Steroid Injection - Sub-Pedicular Approach with Fluoroscopic Guidance  Patient: Chase Wright.      Date of Birth: 17-Dec-1945 MRN: [id number] PCP: Denita Lung, MD      Visit Date: 03/28/2021   Universal Protocol:    Date/Time: 03/28/2021  Consent Given By: the patient  Position: PRONE  Additional Comments: Vital signs were monitored before and after the procedure. Patient was prepped and draped in the usual sterile fashion. The correct patient,  procedure, and site was verified.   Injection Procedure Details:   Procedure diagnoses: Lumbar radiculopathy [M54.16]    Meds Administered:  Meds ordered this encounter  Medications   methylPREDNISolone acetate (DEPO-MEDROL) injection 80 mg    Laterality: Bilateral  Location/Site: L4  Needle:5.0 in., 22 ga.  Short bevel or Quincke spinal needle  Needle Placement: Transforaminal  Findings:    -Comments: Excellent flow of contrast along the nerve, nerve root and into the epidural space.  Procedure Details: After squaring off the end-plates to get a true AP view, the C-arm was positioned so that an oblique view of the foramen as noted above was visualized. The target area is just inferior to the "nose of the scotty dog" or sub pedicular. The soft tissues overlying this structure were infiltrated with 2-3 ml. of 1% Lidocaine without Epinephrine.  The spinal needle was inserted toward the target using a "trajectory" view along the fluoroscope beam.  Under AP and lateral visualization, the needle was advanced so it did not puncture dura and was located close the 6 O'Clock position of the pedical in AP tracterory. Biplanar projections were used to confirm position. Aspiration was confirmed to be negative for CSF and/or blood. A 1-2 ml. volume of Isovue-250 was injected and flow of contrast was noted at each level. Radiographs were obtained for documentation purposes.   After attaining the desired flow of contrast documented above, a 0.5 to 1.0 ml test dose of 0.25% Marcaine was injected into each respective transforaminal space.  The patient was observed for 90 seconds post injection.  After no sensory deficits were reported, and normal lower extremity motor function was noted,   the above injectate was administered so that equal amounts of the injectate were placed at each foramen (level) into the transforaminal epidural space.   Additional Comments:  The patient tolerated the procedure  well Dressing: 2 x 2 sterile gauze and Band-Aid    Post-procedure details: Patient was observed during the procedure. Post-procedure instructions were reviewed.  Patient left the clinic in stable condition.     Clinical History: EXAM: MRI LUMBAR SPINE WITHOUT CONTRAST   TECHNIQUE: Multiplanar, multisequence MR imaging of the lumbar spine was performed. No intravenous contrast was administered.   COMPARISON:  X-ray lumbar 09/29/2020.   FINDINGS: Segmentation:  Standard.   Alignment: There is 3 mm grade 1 anterolisthesis L5 on S1. There is 2 mm grade 1 anterolisthesis L4 on L5. Trace retrolisthesis at L1-2 and L2-3.   Vertebrae: Prominent discogenic endplate marrow changes at L5-S1, and to a lesser degree at L2-3 and L4-5. No evidence of fracture. No evidence of discitis. No suspicious marrow replacing bone lesion.   Conus medullaris and cauda equina: Conus extends to the L1 level. Conus and cauda equina appear normal.   Paraspinal and other soft tissues: Cortically based T2 hyperintense lesionswithin the bilateral kidneys, incompletely characterized, but most likely represent cysts.   Disc levels:   T12-L1: Shallow central disc protrusion. No foraminal or canal stenosis.   L1-L2: Mild disc bulge.  No foraminal or canal stenosis.   L2-L3: Diffuse disc bulge with central protrusion components with slight caudal extension. Mild bilateral facet hypertrophy. Mild-moderate canal stenosis and mild left foraminal stenosis.   L3-L4: Diffuse disc bulge with small central protrusion with cranial extension. Mild-moderate facet arthropathy. Prominence of the posterior epidural fat. Findings result in mild-moderate canal stenosis and mild bilateral foraminal stenosis.   L4-L5: Anterolisthesis with disc uncovering and diffuse disc bulge. Severe bilateral facet arthropathy and ligamentum flavum buckling. Trace bilateral facet joint effusions. Severe canal stenosis  with moderate bilateral foraminal stenosis.   L5-S1: Anterolisthesis with disc uncovering and diffuse disc bulge. Severe bilateral facet arthropathy with trace bilateral facet joint effusions. Moderate-to-severe canal stenosis with moderate-to-severe bilateral foraminal stenosis.   IMPRESSION: Multilevel lumbar spondylosis, most pronounced at the L4-5 and L5-S1 levels where there is severe canal stenosis and moderate bilateral foraminal stenosis at L4-5 and moderate-to-severe canal and biforaminal stenosis at L5-S1.     Electronically Signed   By: Duanne Guess D.O.   On: 02/23/2021 10:41     Objective:  VS:  HT:     WT:    BMI:      BP:    HR: bpm   TEMP: ( )   RESP:  Physical Exam Vitals and nursing note reviewed.  Constitutional:      General: He is not in acute distress.    Appearance: Normal appearance. He is not ill-appearing.  HENT:     Head: Normocephalic and atraumatic.     Right Ear: External ear normal.     Left Ear: External ear normal.     Nose: No congestion.  Eyes:     Extraocular Movements: Extraocular movements intact.  Cardiovascular:     Rate and Rhythm: Normal rate.     Pulses: Normal pulses.  Pulmonary:     Effort: Pulmonary effort is normal. No respiratory distress.  Abdominal:     General: There is no distension.  Palpations: Abdomen is soft.  Musculoskeletal:        General: No tenderness or signs of injury.     Cervical back: Neck supple.     Right lower leg: No edema.     Left lower leg: No edema.     Comments: Patient has good distal strength without clonus.  Skin:    Findings: No erythema or rash.  Neurological:     General: No focal deficit present.     Mental Status: He is alert and oriented to person, place, and time.     Sensory: No sensory deficit.     Motor: No weakness or abnormal muscle tone.     Coordination: Coordination normal.  Psychiatric:        Mood and Affect: Mood normal.        Behavior: Behavior normal.      Imaging: No results found.

## 2021-05-30 ENCOUNTER — Other Ambulatory Visit: Payer: Self-pay | Admitting: Family Medicine

## 2021-05-30 DIAGNOSIS — Z8739 Personal history of other diseases of the musculoskeletal system and connective tissue: Secondary | ICD-10-CM

## 2021-09-22 ENCOUNTER — Other Ambulatory Visit: Payer: Self-pay | Admitting: Family Medicine

## 2021-09-22 DIAGNOSIS — N401 Enlarged prostate with lower urinary tract symptoms: Secondary | ICD-10-CM

## 2021-09-25 NOTE — Telephone Encounter (Signed)
Is this okay to refill? 

## 2021-10-03 ENCOUNTER — Other Ambulatory Visit: Payer: Self-pay | Admitting: Family Medicine

## 2021-10-10 ENCOUNTER — Encounter: Payer: Self-pay | Admitting: Family Medicine

## 2021-10-24 ENCOUNTER — Telehealth: Payer: Self-pay | Admitting: Physical Medicine and Rehabilitation

## 2021-10-24 NOTE — Telephone Encounter (Signed)
Patient called. He would like an injection. His call back number is [phone]

## 2021-10-30 ENCOUNTER — Telehealth: Payer: Self-pay

## 2021-10-30 NOTE — Telephone Encounter (Signed)
Lvm for pt to call back to see if we could squeeze him in to see Dr. Susann Givens. KH

## 2021-10-31 ENCOUNTER — Encounter: Payer: Self-pay | Admitting: Family Medicine

## 2021-10-31 ENCOUNTER — Ambulatory Visit (INDEPENDENT_AMBULATORY_CARE_PROVIDER_SITE_OTHER): Payer: Medicare Other | Admitting: Family Medicine

## 2021-10-31 VITALS — BP 140/80 | HR 88 | Temp 98.8°F | Wt 246.6 lb

## 2021-10-31 DIAGNOSIS — M5416 Radiculopathy, lumbar region: Secondary | ICD-10-CM | POA: Diagnosis not present

## 2021-10-31 DIAGNOSIS — Z951 Presence of aortocoronary bypass graft: Secondary | ICD-10-CM

## 2021-10-31 DIAGNOSIS — E785 Hyperlipidemia, unspecified: Secondary | ICD-10-CM | POA: Diagnosis not present

## 2021-10-31 DIAGNOSIS — R609 Edema, unspecified: Secondary | ICD-10-CM

## 2021-10-31 DIAGNOSIS — I2510 Atherosclerotic heart disease of native coronary artery without angina pectoris: Secondary | ICD-10-CM | POA: Diagnosis not present

## 2021-10-31 MED ORDER — FUROSEMIDE 20 MG PO TABS: 20.0000 mg | ORAL_TABLET | Freq: Every day | ORAL | 3 refills | Status: DC

## 2021-10-31 NOTE — Progress Notes (Signed)
   Subjective:    Patient ID: Chase Wright., male    DOB: 07-Dec-1945, 76 y.o.   MRN: [id number]  HPI He has a history of lumbar radiculopathy and has had epidural injections in the past that have been quite useful.  He is having more pain right now and is using BC powders with some good results until he can get another epidural which will be in mid August.  He also complains of bilateral lower extremity edema.  He has been able to be physically active because of his back and does note that the swelling gets worse by the end of the day.  He has tried support stockings but finds them difficult to put on.  He has had no chest pain, shortness of breath, PND or DOE.  He has not seen cardiology since May of last year. He continues on medications listed in the chart.  Review of Systems     Objective:   Physical Exam Alert and in no distress.  Cardiac exam difficult to do due to his size but he seems to be in the sinus rhythm.  Lower extremities show 3-4+ pitting edema.  Good capillary refill is noted.       Assessment & Plan:  ASHD (arteriosclerotic heart disease) - Plan: CBC with Differential/Platelet, Comprehensive metabolic panel, Lipid panel  Hx of CABG  Morbid obesity (HCC) - Plan: CBC with Differential/Platelet, Comprehensive metabolic panel  Hyperlipidemia, unspecified hyperlipidemia type  Dependent edema - Plan: furosemide (LASIX) 20 MG tablet  Lumbar radiculopathy Recommend he use of BC powder judiciously 2 or 3 times per day until he can get his epidural injections done. I then discussed elevating his feet is much as possible and when he gets up in the morning put some support stockings on that are easily applied rather than have to pull and tug on them.  I will give him Lasix but he needs to come back here in a month to recheck his weight and electrolytes.  He is to also call for an appointment with cardiology.

## 2021-11-01 LAB — COMPREHENSIVE METABOLIC PANEL
ALT: 19 IU/L (ref 0–44)
AST: 19 IU/L (ref 0–40)
Albumin/Globulin Ratio: 2 (ref 1.2–2.2)
Albumin: 4.6 g/dL (ref 3.8–4.8)
Alkaline Phosphatase: 55 IU/L (ref 44–121)
BUN/Creatinine Ratio: 28 — ABNORMAL HIGH (ref 10–24)
BUN: 43 mg/dL — ABNORMAL HIGH (ref 8–27)
Bilirubin Total: 0.3 mg/dL (ref 0.0–1.2)
CO2: 21 mmol/L (ref 20–29)
Calcium: 9.5 mg/dL (ref 8.6–10.2)
Chloride: 103 mmol/L (ref 96–106)
Creatinine, Ser: 1.56 mg/dL — ABNORMAL HIGH (ref 0.76–1.27)
Globulin, Total: 2.3 g/dL (ref 1.5–4.5)
Glucose: 90 mg/dL (ref 70–99)
Potassium: 5.3 mmol/L — ABNORMAL HIGH (ref 3.5–5.2)
Sodium: 139 mmol/L (ref 134–144)
Total Protein: 6.9 g/dL (ref 6.0–8.5)
eGFR: 46 mL/min/{1.73_m2} — ABNORMAL LOW (ref >59)

## 2021-11-01 LAB — CBC WITH DIFFERENTIAL/PLATELET
Basophils Absolute: 0.1 10*3/uL (ref 0.0–0.2)
Basos: 1 %
EOS (ABSOLUTE): 0.5 10*3/uL — ABNORMAL HIGH (ref 0.0–0.4)
Eos: 7 %
Hematocrit: 49.6 % (ref 37.5–51.0)
Hemoglobin: 16.4 g/dL (ref 13.0–17.7)
Immature Grans (Abs): 0 10*3/uL (ref 0.0–0.1)
Immature Granulocytes: 1 %
Lymphocytes Absolute: 2.5 10*3/uL (ref 0.7–3.1)
Lymphs: 32 %
MCH: 31.9 pg (ref 26.6–33.0)
MCHC: 33.1 g/dL (ref 31.5–35.7)
MCV: 97 fL (ref 79–97)
Monocytes Absolute: 0.6 10*3/uL (ref 0.1–0.9)
Monocytes: 8 %
Neutrophils Absolute: 4 10*3/uL (ref 1.4–7.0)
Neutrophils: 51 %
Platelets: 263 10*3/uL (ref 150–450)
RBC: 5.14 x10E6/uL (ref 4.14–5.80)
RDW: 13.3 % (ref 11.6–15.4)
WBC: 7.7 10*3/uL (ref 3.4–10.8)

## 2021-11-01 LAB — LIPID PANEL
Chol/HDL Ratio: 5.9 ratio — ABNORMAL HIGH (ref 0.0–5.0)
Cholesterol, Total: 214 mg/dL — ABNORMAL HIGH (ref 100–199)
HDL: 36 mg/dL — ABNORMAL LOW (ref >39)
LDL Chol Calc (NIH): 157 mg/dL — ABNORMAL HIGH (ref 0–99)
Triglycerides: 116 mg/dL (ref 0–149)
VLDL Cholesterol Cal: 21 mg/dL (ref 5–40)

## 2021-11-19 ENCOUNTER — Encounter: Payer: Self-pay | Admitting: Physical Medicine and Rehabilitation

## 2021-11-19 ENCOUNTER — Ambulatory Visit: Payer: Medicare Other | Admitting: Physical Medicine and Rehabilitation

## 2021-11-19 ENCOUNTER — Ambulatory Visit: Payer: Self-pay

## 2021-11-19 VITALS — BP 136/75 | HR 87

## 2021-11-19 DIAGNOSIS — M5416 Radiculopathy, lumbar region: Secondary | ICD-10-CM

## 2021-11-19 DIAGNOSIS — M48062 Spinal stenosis, lumbar region with neurogenic claudication: Secondary | ICD-10-CM

## 2021-11-19 MED ORDER — METHYLPREDNISOLONE ACETATE 80 MG/ML IJ SUSP
80.0000 mg | Freq: Once | INTRAMUSCULAR | Status: AC
  Administered 2021-11-19: 80 mg

## 2021-11-19 NOTE — Progress Notes (Signed)
Pt state lower back pain that travels to both legs. Pt state walking and standing makes the pain worse. Pt state he takes pain meds to help ease her pain.  Numeric Pain Rating Scale and Functional Assessment Average Pain 9   In the last MONTH (on 0-10 scale) has pain interfered with the following?  1. General activity like being  able to carry out your everyday physical activities such as walking, climbing stairs, carrying groceries, or moving a chair?  Rating(10)   +Driver, -BT, -Dye Allergies.

## 2021-11-19 NOTE — Patient Instructions (Signed)

## 2021-11-26 NOTE — Procedures (Signed)
Lumbosacral Transforaminal Epidural Steroid Injection - Sub-Pedicular Approach with Fluoroscopic Guidance  Patient: Chase Wright.      Date of Birth: 17-Dec-1945 MRN: [id number] PCP: Ronnald Nian, MD      Visit Date: 11/19/2021   Universal Protocol:    Date/Time: 11/19/2021  Consent Given By: the patient  Position: PRONE  Additional Comments: Vital signs were monitored before and after the procedure. Patient was prepped and draped in the usual sterile fashion. The correct patient, procedure, and site was verified.   Injection Procedure Details:   Procedure diagnoses: Lumbar radiculopathy [M54.16]    Meds Administered:  Meds ordered this encounter  Medications   methylPREDNISolone acetate (DEPO-MEDROL) injection 80 mg    Laterality: Bilateral  Location/Site: L4  Needle:5.0 in., 22 ga.  Short bevel or Quincke spinal needle  Needle Placement: Transforaminal  Findings:    -Comments: Excellent flow of contrast along the nerve, nerve root and into the epidural space.  Procedure Details: After squaring off the end-plates to get a true AP view, the C-arm was positioned so that an oblique view of the foramen as noted above was visualized. The target area is just inferior to the "nose of the scotty dog" or sub pedicular. The soft tissues overlying this structure were infiltrated with 2-3 ml. of 1% Lidocaine without Epinephrine.  The spinal needle was inserted toward the target using a "trajectory" view along the fluoroscope beam.  Under AP and lateral visualization, the needle was advanced so it did not puncture dura and was located close the 6 O'Clock position of the pedical in AP tracterory. Biplanar projections were used to confirm position. Aspiration was confirmed to be negative for CSF and/or blood. A 1-2 ml. volume of Isovue-250 was injected and flow of contrast was noted at each level. Radiographs were obtained for documentation purposes.   After attaining the  desired flow of contrast documented above, a 0.5 to 1.0 ml test dose of 0.25% Marcaine was injected into each respective transforaminal space.  The patient was observed for 90 seconds post injection.  After no sensory deficits were reported, and normal lower extremity motor function was noted,   the above injectate was administered so that equal amounts of the injectate were placed at each foramen (level) into the transforaminal epidural space.   Additional Comments:  The patient tolerated the procedure well Dressing: 2 x 2 sterile gauze and Band-Aid    Post-procedure details: Patient was observed during the procedure. Post-procedure instructions were reviewed.  Patient left the clinic in stable condition.

## 2021-11-26 NOTE — Progress Notes (Signed)
Chase Wright. - 76 y.o. male MRN [id number]  Date of birth: 1946-03-20  Office Visit Note: Visit Date: 11/19/2021 PCP: Ronnald Nian, MD Referred by: Ronnald Nian, MD  Subjective: Chief Complaint  Patient presents with   Lower Back - Pain   Right Leg - Pain   Left Leg - Pain   HPI:  Chase Wright. is a 76 y.o. male who comes in today for planned repeat Bilateral L4-5  Lumbar Transforaminal epidural steroid injection with fluoroscopic guidance.  The patient has failed conservative care including home exercise, medications, time and activity modification.  This injection will be diagnostic and hopefully therapeutic.  Please see requesting physician notes for further details and justification. Patient received more than 50% pain relief from prior injection.  We had a fairly lengthy discussion today concerning lumbar stenosis and injections as well as the role of surgical decompression.  Patient would be a good surgical decompression candidate however he is very weary of surgeries given that he had history of heart bypass many years ago with difficulty with that and rightfully very anxious about surgeries.  Referring: Ellin Goodie, FNP   ROS Otherwise per HPI.  Assessment & Plan: Visit Diagnoses:    ICD-10-CM   1. 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   Meds & Orders:  Meds ordered this encounter  Medications   methylPREDNISolone acetate (DEPO-MEDROL) injection 80 mg    Orders Placed This Encounter  Procedures   XR C-ARM NO REPORT   Epidural Steroid injection    Follow-up: Return if symptoms worsen or fail to improve.   Procedures: No procedures performed  Lumbosacral Transforaminal Epidural Steroid Injection - Sub-Pedicular Approach with  Fluoroscopic Guidance  Patient: Chase Wright.      Date of Birth: [birth year] MRN: [id number] PCP: Ronnald Nian, MD      Visit Date: 11/19/2021   Universal Protocol:    Date/Time: 11/19/2021  Consent Given By: the patient  Position: PRONE  Additional Comments: Vital signs were monitored before and after the procedure. Patient was prepped and draped in the usual sterile fashion. The correct patient, procedure, and site was verified.   Injection Procedure Details:   Procedure diagnoses: Lumbar radiculopathy [M54.16]    Meds Administered:  Meds ordered this encounter  Medications   methylPREDNISolone acetate (DEPO-MEDROL) injection 80 mg    Laterality: Bilateral  Location/Site: L4  Needle:5.0 in., 22 ga.  Short bevel or Quincke spinal needle  Needle Placement: Transforaminal  Findings:    -Comments: Excellent flow of contrast along the nerve, nerve root and into the epidural space.  Procedure Details: After squaring off the end-plates to get a true AP view, the C-arm was positioned so that an oblique view of the foramen as noted above was visualized. The target area is just inferior to the "nose of the scotty dog" or sub pedicular. The soft tissues overlying this structure were infiltrated with 2-3 ml. of 1% Lidocaine without Epinephrine.  The spinal needle was inserted toward the target using a "trajectory" view along the fluoroscope beam.  Under AP and lateral visualization, the needle was advanced so it did not puncture dura and was located close the 6 O'Clock position of the pedical in AP tracterory.  Biplanar projections were used to confirm position. Aspiration was confirmed to be negative for CSF and/or blood. A 1-2 ml. volume of Isovue-250 was injected and flow of contrast was noted at each level. Radiographs were obtained for documentation purposes.   After attaining the desired flow of contrast documented above, a 0.5 to 1.0 ml test dose of 0.25% Marcaine  was injected into each respective transforaminal space.  The patient was observed for 90 seconds post injection.  After no sensory deficits were reported, and normal lower extremity motor function was noted,   the above injectate was administered so that equal amounts of the injectate were placed at each foramen (level) into the transforaminal epidural space.   Additional Comments:  The patient tolerated the procedure well Dressing: 2 x 2 sterile gauze and Band-Aid    Post-procedure details: Patient was observed during the procedure. Post-procedure instructions were reviewed.  Patient left the clinic in stable condition.    Clinical History: No specialty comments available.     Objective:  VS:  HT:    WT:   BMI:     BP:136/75  HR:87bpm  TEMP: ( )  RESP:  Physical Exam Vitals and nursing note reviewed.  Constitutional:      General: He is not in acute distress.    Appearance: Normal appearance. He is obese. He is not ill-appearing.  HENT:     Head: Normocephalic and atraumatic.     Right Ear: External ear normal.     Left Ear: External ear normal.     Nose: No congestion.  Eyes:     Extraocular Movements: Extraocular movements intact.  Cardiovascular:     Rate and Rhythm: Normal rate.     Pulses: Normal pulses.  Pulmonary:     Effort: Pulmonary effort is normal. No respiratory distress.  Abdominal:     General: There is no distension.     Palpations: Abdomen is soft.  Musculoskeletal:        General: No tenderness or signs of injury.     Cervical back: Neck supple.     Right lower leg: No edema.     Left lower leg: No edema.     Comments: Patient has good distal strength without clonus.  Skin:    Findings: No erythema or rash.  Neurological:     General: No focal deficit present.     Mental Status: He is alert and oriented to person, place, and time.     Sensory: No sensory deficit.     Motor: No weakness or abnormal muscle tone.     Coordination:  Coordination normal.  Psychiatric:        Mood and Affect: Mood normal.        Behavior: Behavior normal.      Imaging: No results found.

## 2021-11-30 ENCOUNTER — Ambulatory Visit (INDEPENDENT_AMBULATORY_CARE_PROVIDER_SITE_OTHER): Payer: Medicare Other | Admitting: Family Medicine

## 2021-11-30 ENCOUNTER — Encounter: Payer: Self-pay | Admitting: Family Medicine

## 2021-11-30 VITALS — BP 116/80 | HR 74 | Temp 98.4°F | Wt 239.2 lb

## 2021-11-30 DIAGNOSIS — R609 Edema, unspecified: Secondary | ICD-10-CM

## 2021-11-30 NOTE — Progress Notes (Signed)
   Subjective:    Patient ID: Chase Fort., male    DOB: 1946/04/04, 76 y.o.   MRN: [id number]  HPI He is here for a recheck.  His legs are doing much better.  He is wearing support stockings which really helps a lot and making it much easier for him to walk.   Review of Systems     Objective:   Physical Exam Exam of both legs does show decreased edema.  Skin appears normal.  No erythema.       Assessment & Plan:  Dependent edema - Plan: Basic Metabolic Panel Recommend that he continue to use his support stockings but back off on using the Lasix on an as-needed basis.  He was comfortable with that.  He is also to come in in the near future for complete exam.  He would like to get a chest x-ray to follow-up on a benign nodule found in 2020 during the bronchoscopy.  He also wants a PSA

## 2021-12-01 LAB — BASIC METABOLIC PANEL
BUN/Creatinine Ratio: 26 — ABNORMAL HIGH (ref 10–24)
BUN: 44 mg/dL — ABNORMAL HIGH (ref 8–27)
CO2: 23 mmol/L (ref 20–29)
Calcium: 10.1 mg/dL (ref 8.6–10.2)
Chloride: 95 mmol/L — ABNORMAL LOW (ref 96–106)
Creatinine, Ser: 1.67 mg/dL — ABNORMAL HIGH (ref 0.76–1.27)
Glucose: 88 mg/dL (ref 70–99)
Potassium: 5.2 mmol/L (ref 3.5–5.2)
Sodium: 138 mmol/L (ref 134–144)
eGFR: 43 mL/min/{1.73_m2} — ABNORMAL LOW (ref >59)

## 2021-12-14 NOTE — Progress Notes (Signed)
Lvm for pt about labs . KH

## 2021-12-27 ENCOUNTER — Other Ambulatory Visit: Payer: Self-pay | Admitting: Family Medicine

## 2021-12-27 DIAGNOSIS — Z8739 Personal history of other diseases of the musculoskeletal system and connective tissue: Secondary | ICD-10-CM

## 2021-12-27 NOTE — Telephone Encounter (Signed)
[facility] is requesting to fill pt allopurinol. Please advise KH 

## 2022-02-07 ENCOUNTER — Telehealth: Payer: Self-pay | Admitting: Family Medicine

## 2022-02-07 NOTE — Telephone Encounter (Signed)
Left message for patient to call back and schedule Medicare Annual Wellness Visit (AWV) either virtually or in office. Left  my Herbie Drape number [phone]   Last AWV  02/21/21 please schedule with Nurse Health Adviser   45 min for awv-i and in office appointments 30 min for awv-s  phone/virtual appointments

## 2022-03-05 DIAGNOSIS — H1789 Other corneal scars and opacities: Secondary | ICD-10-CM | POA: Diagnosis not present

## 2022-03-05 DIAGNOSIS — H353131 Nonexudative age-related macular degeneration, bilateral, early dry stage: Secondary | ICD-10-CM | POA: Diagnosis not present

## 2022-03-05 DIAGNOSIS — H5213 Myopia, bilateral: Secondary | ICD-10-CM | POA: Diagnosis not present

## 2022-03-05 DIAGNOSIS — H25813 Combined forms of age-related cataract, bilateral: Secondary | ICD-10-CM | POA: Diagnosis not present

## 2022-03-15 ENCOUNTER — Ambulatory Visit (INDEPENDENT_AMBULATORY_CARE_PROVIDER_SITE_OTHER): Payer: Medicare Other

## 2022-03-15 VITALS — Ht 66.0 in | Wt 240.0 lb

## 2022-03-15 DIAGNOSIS — Z0000 Encounter for general adult medical examination without abnormal findings: Secondary | ICD-10-CM

## 2022-03-15 NOTE — Patient Instructions (Signed)
Chase Wright , Thank you for taking time to come for your Medicare Wellness Visit. I appreciate your ongoing commitment to your health goals. Please review the following plan we discussed and let me know if I can assist you in the future.   These are the goals we discussed:  Goals      Patient Stated     03/15/2022, no goals        This is a list of the screening recommended for you and due dates:  Health Maintenance  Topic Date Due   Medicare Annual Wellness Visit  03/16/2023   Cologuard (Stool DNA test)  03/09/2024   DTaP/Tdap/Td vaccine (5 - Td or Tdap) 07/12/2030   Flu Shot  Completed   COVID-19 Vaccine  Completed   Hepatitis C Screening: USPSTF Recommendation to screen - Ages 18-79 yo.  Completed   Zoster (Shingles) Vaccine  Completed   HPV Vaccine  Aged Out   Pneumonia Vaccine  Discontinued    Advanced directives: copy in chart  Conditions/risks identified: none  Next appointment: Follow up in one year for your annual wellness visit.   Preventive Care 75 Years and Older, Male  Preventive care refers to lifestyle choices and visits with your health care provider that can promote health and wellness. What does preventive care include? A yearly physical exam. This is also called an annual well check. Dental exams once or twice a year. Routine eye exams. Ask your health care provider how often you should have your eyes checked. Personal lifestyle choices, including: Daily care of your teeth and gums. Regular physical activity. Eating a healthy diet. Avoiding tobacco and drug use. Limiting alcohol use. Practicing safe sex. Taking low doses of aspirin every day. Taking vitamin and mineral supplements as recommended by your health care provider. What happens during an annual well check? The services and screenings done by your health care provider during your annual well check will depend on your age, overall health, lifestyle risk factors, and family history of  disease. Counseling  Your health care provider may ask you questions about your: Alcohol use. Tobacco use. Drug use. Emotional well-being. Home and relationship well-being. Sexual activity. Eating habits. History of falls. Memory and ability to understand (cognition). Work and work Astronomer. Screening  You may have the following tests or measurements: Height, weight, and BMI. Blood pressure. Lipid and cholesterol levels. These may be checked every 5 years, or more frequently if you are over 79 years old. Skin check. Lung cancer screening. You may have this screening every year starting at age 54 if you have a 30-pack-year history of smoking and currently smoke or have quit within the past 15 years. Fecal occult blood test (FOBT) of the stool. You may have this test every year starting at age 27. Flexible sigmoidoscopy or colonoscopy. You may have a sigmoidoscopy every 5 years or a colonoscopy every 10 years starting at age 32. Prostate cancer screening. Recommendations will vary depending on your family history and other risks. Hepatitis C blood test. Hepatitis B blood test. Sexually transmitted disease (STD) testing. Diabetes screening. This is done by checking your blood sugar (glucose) after you have not eaten for a while (fasting). You may have this done every 1-3 years. Abdominal aortic aneurysm (AAA) screening. You may need this if you are a current or former smoker. Osteoporosis. You may be screened starting at age 60 if you are at high risk. Talk with your health care provider about your test results, treatment options,  and if necessary, the need for more tests. Vaccines  Your health care provider may recommend certain vaccines, such as: Influenza vaccine. This is recommended every year. Tetanus, diphtheria, and acellular pertussis (Tdap, Td) vaccine. You may need a Td booster every 10 years. Zoster vaccine. You may need this after age 7. Pneumococcal 13-valent  conjugate (PCV13) vaccine. One dose is recommended after age 6. Pneumococcal polysaccharide (PPSV23) vaccine. One dose is recommended after age 62. Talk to your health care provider about which screenings and vaccines you need and how often you need them. This information is not intended to replace advice given to you by your health care provider. Make sure you discuss any questions you have with your health care provider. Document Released: 04/21/2015 Document Revised: 12/13/2015 Document Reviewed: 01/24/2015 Elsevier Interactive Patient Education  2017 [place] Prevention in the Home Falls can cause injuries. They can happen to people of all ages. There are many things you can do to make your home safe and to help prevent falls. What can I do on the outside of my home? Regularly fix the edges of walkways and driveways and fix any cracks. Remove anything that might make you trip as you walk through a door, such as a raised step or threshold. Trim any bushes or trees on the path to your home. Use bright outdoor lighting. Clear any walking paths of anything that might make someone trip, such as rocks or tools. Regularly check to see if handrails are loose or broken. Make sure that both sides of any steps have handrails. Any raised decks and porches should have guardrails on the edges. Have any leaves, snow, or ice cleared regularly. Use sand or salt on walking paths during winter. Clean up any spills in your garage right away. This includes oil or grease spills. What can I do in the bathroom? Use night lights. Install grab bars by the toilet and in the tub and shower. Do not use towel bars as grab bars. Use non-skid mats or decals in the tub or shower. If you need to sit down in the shower, use a plastic, non-slip stool. Keep the floor dry. Clean up any water that spills on the floor as soon as it happens. Remove soap buildup in the tub or shower regularly. Attach bath mats  securely with double-sided non-slip rug tape. Do not have throw rugs and other things on the floor that can make you trip. What can I do in the bedroom? Use night lights. Make sure that you have a light by your bed that is easy to reach. Do not use any sheets or blankets that are too big for your bed. They should not hang down onto the floor. Have a firm chair that has side arms. You can use this for support while you get dressed. Do not have throw rugs and other things on the floor that can make you trip. What can I do in the kitchen? Clean up any spills right away. Avoid walking on wet floors. Keep items that you use a lot in easy-to-reach places. If you need to reach something above you, use a strong step stool that has a grab bar. Keep electrical cords out of the way. Do not use floor polish or wax that makes floors slippery. If you must use wax, use non-skid floor wax. Do not have throw rugs and other things on the floor that can make you trip. What can I do with my stairs? Do not leave  any items on the stairs. Make sure that there are handrails on both sides of the stairs and use them. Fix handrails that are broken or loose. Make sure that handrails are as long as the stairways. Check any carpeting to make sure that it is firmly attached to the stairs. Fix any carpet that is loose or worn. Avoid having throw rugs at the top or bottom of the stairs. If you do have throw rugs, attach them to the floor with carpet tape. Make sure that you have a light switch at the top of the stairs and the bottom of the stairs. If you do not have them, ask someone to add them for you. What else can I do to help prevent falls? Wear shoes that: Do not have high heels. Have rubber bottoms. Are comfortable and fit you well. Are closed at the toe. Do not wear sandals. If you use a stepladder: Make sure that it is fully opened. Do not climb a closed stepladder. Make sure that both sides of the stepladder  are locked into place. Ask someone to hold it for you, if possible. Clearly mark and make sure that you can see: Any grab bars or handrails. First and last steps. Where the edge of each step is. Use tools that help you move around (mobility aids) if they are needed. These include: Canes. Walkers. Scooters. Crutches. Turn on the lights when you go into a dark area. Replace any light bulbs as soon as they burn out. Set up your furniture so you have a clear path. Avoid moving your furniture around. If any of your floors are uneven, fix them. If there are any pets around you, be aware of where they are. Review your medicines with your doctor. Some medicines can make you feel dizzy. This can increase your chance of falling. Ask your doctor what other things that you can do to help prevent falls. This information is not intended to replace advice given to you by your health care provider. Make sure you discuss any questions you have with your health care provider. Document Released: 01/19/2009 Document Revised: 08/31/2015 Document Reviewed: 04/29/2014 Elsevier Interactive Patient Education  2017 [employer].

## 2022-03-15 NOTE — Progress Notes (Signed)
I connected with Chase Wright today by telephone and verified that I am speaking with the correct person using two identifiers. Location patient: home Location provider: work Persons participating in the virtual visit: Ason Castrogiovanni, Glenna Durand LPN.   I discussed the limitations, risks, security and privacy concerns of performing an evaluation and management service by telephone and the availability of in person appointments. I also discussed with the patient that there may be a patient responsible charge related to this service. The patient expressed understanding and verbally consented to this telephonic visit.    Interactive audio and video telecommunications were attempted between this provider and patient, however failed, due to patient having technical difficulties OR patient did not have access to video capability.  We continued and completed visit with audio only.     Vital signs may be patient reported or missing.  Subjective:   Chase Lanser. is a 76 y.o. male who presents for Medicare Annual/Subsequent preventive examination.  Review of Systems     Cardiac Risk Factors include: advanced age (>36men, >20 women);dyslipidemia;hypertension;male gender;obesity (BMI >30kg/m2)     Objective:    Today's Vitals   03/15/22 1056  Weight: 240 lb (108.9 kg)  Height: 5\' 6"  (1.676 m)  PainSc: 8    Body mass index is 38.74 kg/m.     03/15/2022   11:03 AM 02/21/2021    1:29 PM 10/23/2020    1:46 PM 07/12/2020   11:02 AM 07/11/2020    3:12 PM 01/27/2020    1:16 PM 01/18/2019    2:07 PM  Advanced Directives  Does Patient Have a Medical Advance Directive? Yes Yes Yes Yes Yes Yes Yes  Type of Paramedic of [place];Living will Living will;Healthcare Power of [place];Out of facility DNR (pink MOST or yellow form) [place];Living will Out of facility DNR (pink MOST or yellow form);[place];Living will Out  of facility DNR (pink MOST or yellow form) Healthcare Power of [place];Living will  Does patient want to make changes to medical advance directive?  No - Patient declined No - Patient declined No - Patient declined No - Patient declined  No - Patient declined  Copy of [place] in Chart? Yes - validated most recent copy scanned in chart (See row information) Yes - validated most recent copy scanned in chart (See row information)  No - copy requested  Yes - validated most recent copy scanned in chart (See row information) No - copy requested  Would patient like information on creating a medical advance directive?    No - Patient declined No - Patient declined      Current Medications (verified) Outpatient Encounter Medications as of 03/15/2022  Medication Sig   acidophilus (RISAQUAD) CAPS capsule Take 1 capsule by mouth daily.   alfuzosin (UROXATRAL) 10 MG 24 hr tablet TAKE 1 TABLET BY MOUTH EVERY DAY   allopurinol (ZYLOPRIM) 300 MG tablet TAKE 1 TABLET BY MOUTH EVERY DAY   ALPHA-LIPOIC ACID PO Take 1,000 mg by mouth.   Artificial Tear Ointment (DRY EYES OP) Apply 1 drop to eye daily as needed (dry eyes).   B Complex-C (SUPER B COMPLEX PO) Take by mouth.   famotidine (PEPCID) 20 MG tablet Take 20 mg by mouth daily.    lisinopril-hydrochlorothiazide (ZESTORETIC) 10-12.5 MG tablet TAKE 1 TABLET BY MOUTH DAILY   aspirin 325 MG tablet Take 325 mg by mouth at bedtime. (Patient not taking:  Reported on 11/30/2021)   Aspirin-Salicylamide-Caffeine (ARTHRITIS STRENGTH BC POWDER PO) Take by mouth. (Patient not taking: Reported on 03/15/2022)   furosemide (LASIX) 20 MG tablet Take 1 tablet (20 mg total) by mouth daily. (Patient not taking: Reported on 03/15/2022)   No facility-administered encounter medications on file as of 03/15/2022.    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   CARDIAC CATHETERIZATION  08/2005   NASAL ENDOSCOPY  2008   VIDEO BRONCHOSCOPY Bilateral 05/01/2018   Procedure: VIDEO BRONCHOSCOPY WITHOUT FLUORO;  Surgeon: Luciano Cutter, MD;  Location: [facility];  Service: Cardiopulmonary;  Laterality: Bilateral;   Family History  Problem Relation Age of Onset   Diabetes Mother    Prostate cancer Father 86   Colon cancer Neg Hx    Social History   Socioeconomic History   Marital status: Single    Spouse name: Not on file   Number of children: Not on file   Years of education: Not on file   Highest education level: Not on file  Occupational History   Not on file  Tobacco Use   Smoking status: Never   Smokeless tobacco: Never  Vaping Use   Vaping Use: Never used  Substance and Sexual Activity   Alcohol use: Yes    Comment: rare beer   Drug use: No   Sexual activity: Not Currently  Other Topics Concern   Not on file  Social History Narrative   Not on file   Social Determinants of Health   Financial Resource Strain: Low Risk  (03/15/2022)   Overall Financial Resource Strain (CARDIA)    Difficulty of Paying Living Expenses: Not hard at all  Food Insecurity: No Food Insecurity (03/15/2022)   Hunger Vital Sign    Worried About Running Out of Food in the Last Year: Never true    Ran Out of Food in the Last Year: Never true  Transportation Needs: No Transportation Needs (03/15/2022)   PRAPARE - Administrator, Civil Service (Medical): No    Lack of Transportation (Non-Medical): No  Physical Activity: Inactive (03/15/2022)   Exercise Vital Sign    Days of Exercise per Week: 0 days     Minutes of Exercise per Session: 0 min  Stress: No Stress Concern Present (03/15/2022)   [employer] of Occupational Health - Occupational Stress Questionnaire    Feeling of Stress : Not at all  Social Connections: Not on file    Tobacco Counseling Counseling given: Not Answered   Clinical Intake:  Pre-visit preparation completed: Yes  Pain : 0-10 Pain Score: 8  Pain Type: Chronic pain Pain Location: Back Pain Descriptors / Indicators: Aching Pain Onset: More than a month ago Pain Frequency: Constant     Nutritional Status: BMI > 30  Obese Nutritional Risks: None Diabetes: No  How often do you need to have someone help you when you read instructions, pamphlets, or other written materials from your doctor or pharmacy?: 1 - Never  Diabetic? no  Interpreter Needed?: No  Information entered by :: NAllen LPN   Activities of Daily Living    03/15/2022   11:07 AM  In your present state of health, do you have any difficulty performing the following activities:  Hearing? 0  Vision? 0  Difficulty concentrating or making decisions? 0  Walking or climbing stairs? 1  Dressing or bathing? 0  Doing errands, shopping? 0  Preparing Food and eating ? N  Using the Toilet? N  In the past six months, have you accidently leaked urine? N  Do you have problems with loss of bowel control? N  Managing your Medications? N  Managing your Finances? N  Housekeeping or managing your Housekeeping? N    Patient Care Team: Denita Lung, MD as PCP - General (Family Medicine)  Indicate any recent Medical Services you may have received from other than [facility] providers in the past year (date may be approximate).     Assessment:   This is a routine wellness examination for Hildred.  Hearing/Vision screen Vision Screening - Comments:: Regular eye exams, Dr. Delman Cheadle  Dietary issues and exercise activities discussed: Current Exercise Habits: The patient does not participate in regular  exercise at present   Goals Addressed             This Visit's Progress    Patient Stated       03/15/2022, no goals       Depression Screen    03/15/2022   11:04 AM 02/21/2021    1:32 PM 08/01/2020    1:26 PM 01/27/2020    1:18 PM 01/18/2019    1:45 PM 11/04/2017    1:32 PM 10/24/2016    1:46 PM  PHQ 2/9 Scores  PHQ - 2 Score 0 0 3 0 0 0 0  PHQ- 9 Score 3  9        Fall Risk    03/15/2022   11:03 AM 02/21/2021    1:30 PM 08/01/2020    1:26 PM 01/27/2020    1:18 PM 01/18/2019    1:45 PM  Fall Risk   Falls in the past year? 0 1 1 1  0  Number falls in past yr: 0 1 1 1    Injury with Fall? 0 1 1 0   Risk for fall due to : Impaired mobility;Impaired balance/gait;Medication side effect Impaired balance/gait Other (Comment)    Risk for fall due to: Comment   tripped    Follow up Falls prevention discussed;Falls evaluation completed;Education provided Falls evaluation completed;Education provided Falls evaluation completed      FALL RISK PREVENTION PERTAINING TO THE HOME:  Any stairs in or around the home? No  If so, are there any without handrails? N/a Home free of loose throw rugs in walkways, pet beds, electrical cords, etc? Yes  Adequate lighting in your home to reduce risk of falls? Yes   ASSISTIVE DEVICES UTILIZED TO PREVENT FALLS:  Life alert? No  Use of a cane, walker or w/c? Yes  Grab bars in the bathroom? Yes  Shower chair or bench in shower? No  Elevated toilet seat or a handicapped toilet? No   TIMED UP AND GO:  Was the test performed? No .       Cognitive Function:        03/15/2022   11:08 AM  6CIT Screen  What Year? 0 points  What month? 0 points  What time? 0 points  Count back from 20 0 points  Months in reverse 0 points  Repeat phrase 0 points  Total Score 0 points    Immunizations Immunization History  Administered Date(s) Administered   COVID-19, mRNA, vaccine(Comirnaty)12 years and older 01/11/2022   DTaP 07/24/1992    Influenza Split 01/06/2012, 01/17/2013, 02/22/2014   Influenza Whole 01/19/1991, 02/11/2006, 12/31/2007   Influenza, High Dose Seasonal PF 12/09/2013, 11/20/2016, 12/24/2017, 12/01/2018, 12/01/2018, 12/28/2021   Influenza-Unspecified 01/06/2015, 12/31/2015, 11/20/2016, 11/19/2019, 01/08/2021   PFIZER(Purple Top)SARS-COV-2 Vaccination 06/08/2019, 07/06/2019, 01/06/2020, 07/10/2020, 01/08/2021   Pneumococcal Conjugate-13 10/24/2016   Pneumococcal Polysaccharide-23 10/06/2002, 02/25/2012   Respiratory Syncytial Virus Vaccine,Recomb Aduvanted(Arexvy) 01/22/2022   Tdap 01/06/2007, 11/20/2016, 07/11/2020   Zoster Recombinat (Shingrix) 11/20/2016, 05/13/2017   Zoster, Live 10/06/2002    TDAP status: Up to date  Flu Vaccine status: Up to date  Pneumococcal vaccine status: Up to date  Covid-19 vaccine status: Completed vaccines  Qualifies for Shingles Vaccine? Yes   Zostavax completed Yes   Shingrix Completed?: Yes  Screening Tests Health Maintenance  Topic Date Due   Medicare Annual Wellness (AWV)  02/21/2022   Fecal DNA (Cologuard)  03/09/2024   DTaP/Tdap/Td (5 - Td or Tdap) 07/12/2030   INFLUENZA VACCINE  Completed   COVID-19 Vaccine  Completed   Hepatitis C Screening  Completed   Zoster Vaccines- Shingrix  Completed   HPV VACCINES  Aged Out   Pneumonia Vaccine 64+ Years old  Discontinued    Health Maintenance  Health Maintenance Due  Topic Date Due   Medicare Annual Wellness (AWV)  02/21/2022    Colorectal cancer screening: Type of screening: Cologuard. Completed 03/09/2022. Repeat every 3 years  Lung Cancer Screening: (Low Dose CT Chest recommended if Age 6-80 years, 30 pack-year currently smoking OR have quit w/in 15years.) does not qualify.   Lung Cancer Screening Referral: no  Additional Screening:  Hepatitis C Screening: does qualify; Completed 07/25/2016  Vision Screening: Recommended annual ophthalmology exams for early detection of glaucoma and other  disorders of the eye. Is the patient up to date with their annual eye exam?  Yes  Who is the provider or what is the name of the office in which the patient attends annual eye exams? Dr. Delman Cheadle If pt is not established with a provider, would they like to be referred to a provider to establish care? No .   Dental Screening: Recommended annual dental exams for proper oral hygiene  Community Resource Referral / Chronic Care Management: CRR required this visit?  No   CCM required this visit?  No      Plan:     I have personally reviewed and noted the following in the patient's chart:   Medical and social history Use of alcohol, tobacco or illicit drugs  Current medications and supplements including opioid prescriptions. Patient is not currently taking opioid prescriptions. Functional ability and status Nutritional status Physical activity Advanced directives List of other physicians Hospitalizations, surgeries, and ER visits in previous 12 months Vitals Screenings to include cognitive, depression, and falls Referrals and appointments  In addition, I have reviewed and discussed with patient certain preventive protocols, quality metrics, and best practice recommendations. A written personalized care plan for preventive services as well as general preventive health recommendations were provided to patient.     Kellie Simmering, LPN   [id number]   Nurse Notes: none  Due to this being a virtual visit, the after visit summary with patients personalized plan was offered to patient via mail or my-chart. to pick up at office at next visit

## 2022-03-24 DIAGNOSIS — N1831 Chronic kidney disease, stage 3a: Secondary | ICD-10-CM | POA: Insufficient documentation

## 2022-03-25 ENCOUNTER — Encounter: Payer: Self-pay | Admitting: Family Medicine

## 2022-03-25 ENCOUNTER — Ambulatory Visit (INDEPENDENT_AMBULATORY_CARE_PROVIDER_SITE_OTHER): Payer: Medicare Other | Admitting: Family Medicine

## 2022-03-25 VITALS — BP 140/70 | HR 80 | Ht 66.0 in | Wt 242.0 lb

## 2022-03-25 DIAGNOSIS — I700 Atherosclerosis of aorta: Secondary | ICD-10-CM

## 2022-03-25 DIAGNOSIS — Z0000 Encounter for general adult medical examination without abnormal findings: Secondary | ICD-10-CM

## 2022-03-25 DIAGNOSIS — I2510 Atherosclerotic heart disease of native coronary artery without angina pectoris: Secondary | ICD-10-CM | POA: Diagnosis not present

## 2022-03-25 DIAGNOSIS — Z125 Encounter for screening for malignant neoplasm of prostate: Secondary | ICD-10-CM

## 2022-03-25 DIAGNOSIS — E785 Hyperlipidemia, unspecified: Secondary | ICD-10-CM | POA: Diagnosis not present

## 2022-03-25 DIAGNOSIS — N3281 Overactive bladder: Secondary | ICD-10-CM

## 2022-03-25 DIAGNOSIS — N401 Enlarged prostate with lower urinary tract symptoms: Secondary | ICD-10-CM

## 2022-03-25 DIAGNOSIS — I10 Essential (primary) hypertension: Secondary | ICD-10-CM

## 2022-03-25 DIAGNOSIS — T466X5A Adverse effect of antihyperlipidemic and antiarteriosclerotic drugs, initial encounter: Secondary | ICD-10-CM

## 2022-03-25 DIAGNOSIS — Z86010 Personal history of colonic polyps: Secondary | ICD-10-CM

## 2022-03-25 DIAGNOSIS — M791 Myalgia, unspecified site: Secondary | ICD-10-CM

## 2022-03-25 DIAGNOSIS — N1831 Chronic kidney disease, stage 3a: Secondary | ICD-10-CM

## 2022-03-25 DIAGNOSIS — M5416 Radiculopathy, lumbar region: Secondary | ICD-10-CM

## 2022-03-25 DIAGNOSIS — Z951 Presence of aortocoronary bypass graft: Secondary | ICD-10-CM

## 2022-03-25 DIAGNOSIS — Z8739 Personal history of other diseases of the musculoskeletal system and connective tissue: Secondary | ICD-10-CM

## 2022-03-25 DIAGNOSIS — K219 Gastro-esophageal reflux disease without esophagitis: Secondary | ICD-10-CM

## 2022-03-25 DIAGNOSIS — H259 Unspecified age-related cataract: Secondary | ICD-10-CM

## 2022-03-25 MED ORDER — ALLOPURINOL 300 MG PO TABS: 300.0000 mg | ORAL_TABLET | Freq: Every day | ORAL | 3 refills | Status: DC

## 2022-03-25 MED ORDER — ALFUZOSIN HCL ER 10 MG PO TB24: 10.0000 mg | ORAL_TABLET | Freq: Every day | ORAL | 3 refills | Status: DC

## 2022-03-25 MED ORDER — LISINOPRIL-HYDROCHLOROTHIAZIDE 10-12.5 MG PO TABS: 1.0000 | ORAL_TABLET | Freq: Every day | ORAL | 1 refills | Status: DC

## 2022-03-25 NOTE — Progress Notes (Signed)
Complete physical exam  Patient: Chase Wright.   DOB: Aug 26, 1945   76 y.o. Male  MRN: [id number]  Subjective:    Chief Complaint  Patient presents with   Annual Exam    Fasting. Had AWV with [facility] on 03/15/2022. Wants to discuss chronic eye drainage.     Chase Wright. is a 76 y.o. male who presents today for a complete physical exam. He reports consuming a general diet. The patient does not participate in regular exercise at present. He generally feels poorly. He reports sleeping poorly. He continues have difficulty with lumbar radiculopathy in spite of epidural injections.  Review of the note from Dr. Alvester Morin indicated the possibility of surgical intervention.  He has a history of ASHD and did not tolerate statins or Repatha.  He is not interested in trying any other medications.  He apparently also has a history of cataracts but at this point is not interested in pursuing this further.  He has stopped taking the Lasix and states that his legs seem fine now.  He still has difficulty dealing with grieving from the death of his longtime partner.  He continues on Uroxatrol and is having no difficulty with that.  He is also taking allopurinol on a regular basis.  His reflux is not causing any difficulty.  He has not had any chest pain, shortness of breath, PND or DOE.  His weight is unchanged.  Does have x-ray evidence of aortic atherosclerosis. Most recent fall risk assessment:    03/15/2022   11:03 AM  Fall Risk   Falls in the past year? 0  Number falls in past yr: 0  Injury with Fall? 0  Risk for fall due to : Impaired mobility;Impaired balance/gait;Medication side effect  Follow up Falls prevention discussed;Falls evaluation completed;Education provided     Most recent depression screenings:    03/15/2022   11:04 AM 02/21/2021    1:32 PM  PHQ 2/9 Scores  PHQ - 2 Score 0 0  PHQ- 9 Score 3         Patient Care Team: Ronnald Nian, MD as PCP - General (Family  Medicine)   Outpatient Medications Prior to Visit  Medication Sig   acidophilus (RISAQUAD) CAPS capsule Take 1 capsule by mouth daily.   ALPHA-LIPOIC ACID PO Take 1,000 mg by mouth.   Artificial Tear Ointment (DRY EYES OP) Apply 1 drop to eye daily as needed (dry eyes).   aspirin 325 MG tablet Take 325 mg by mouth at bedtime.   Aspirin-Salicylamide-Caffeine (ARTHRITIS STRENGTH BC POWDER PO) Take by mouth.   B Complex-C (SUPER B COMPLEX PO) Take by mouth.   famotidine (PEPCID) 20 MG tablet Take 20 mg by mouth daily.    [DISCONTINUED] alfuzosin (UROXATRAL) 10 MG 24 hr tablet TAKE 1 TABLET BY MOUTH EVERY DAY   [DISCONTINUED] allopurinol (ZYLOPRIM) 300 MG tablet TAKE 1 TABLET BY MOUTH EVERY DAY   [DISCONTINUED] furosemide (LASIX) 20 MG tablet Take 1 tablet (20 mg total) by mouth daily.   [DISCONTINUED] lisinopril-hydrochlorothiazide (ZESTORETIC) 10-12.5 MG tablet TAKE 1 TABLET BY MOUTH DAILY   No facility-administered medications prior to visit.    Review of Systems  All other systems reviewed and are negative.         Objective:     BP (!) 140/70   Pulse 80   Ht 5\' 6"  (1.676 m)   Wt 242 lb (109.8 kg)   SpO2 98% Comment: room air  BMI 39.06  kg/m    Physical Exam  Alert and in no distress. Tympanic membranes and canals are normal. Pharyngeal area is normal. Neck is supple without adenopathy or thyromegaly. Cardiac exam shows a regular sinus rhythm without murmurs or gallops. Lungs are clear to auscultation.      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  Benign prostatic hyperplasia with lower urinary tract symptoms, symptom details unspecified - Plan: alfuzosin (UROXATRAL) 10 MG 24 hr tablet  Screening for prostate cancer - Plan: PSA  Senile cataract, unspecified age-related cataract type, unspecified laterality  Immunization History  Administered Date(s) Administered   COVID-19, mRNA, vaccine(Comirnaty)12 years and older 01/11/2022   DTaP 07/24/1992   Influenza Split 01/06/2012, 01/17/2013, 02/22/2014   Influenza Whole 01/19/1991, 02/11/2006, 12/31/2007   Influenza, High Dose Seasonal PF 12/09/2013, 11/20/2016, 12/24/2017, 12/01/2018, 12/01/2018, 12/28/2021   Influenza-Unspecified 01/06/2015, 12/31/2015, 11/20/2016, 11/19/2019, 01/08/2021   PFIZER(Purple Top)SARS-COV-2 Vaccination 06/08/2019, 07/06/2019, 01/06/2020, 07/10/2020, 01/08/2021   Pneumococcal Conjugate-13 10/24/2016   Pneumococcal Polysaccharide-23 10/06/2002, 02/25/2012   Respiratory Syncytial Virus Vaccine,Recomb Aduvanted(Arexvy) 01/22/2022   Tdap 01/06/2007, 11/20/2016, 07/11/2020   Zoster Recombinat (Shingrix) 11/20/2016, 05/13/2017   Zoster, Live 10/06/2002    Health Maintenance  Topic Date Due   Medicare Annual Wellness (AWV)  03/16/2023   Fecal DNA (Cologuard)  03/09/2024   DTaP/Tdap/Td (5 - Td or Tdap) 07/12/2030   INFLUENZA VACCINE  Completed   COVID-19 Vaccine  Completed   Hepatitis C Screening  Completed   Zoster Vaccines- Shingrix  Completed   HPV VACCINES  Aged Out   Pneumonia Vaccine 76+ Years old  Discontinued    Discussed health benefits of physical activity, and encouraged him to engage in regular exercise appropriate for his age and condition.  Problem List Items Addressed This Visit     ASHD (arteriosclerotic heart disease) (Chronic)   Relevant Medications    lisinopril-hydrochlorothiazide (ZESTORETIC) 10-12.5 MG tablet   Gastroesophageal reflux disease without esophagitis (Chronic)   Hx of CABG (Chronic)   Hypertension (Chronic)   Relevant Medications   lisinopril-hydrochlorothiazide (ZESTORETIC) 10-12.5 MG tablet   Abdominal aortic atherosclerosis (HCC)   Relevant Medications   lisinopril-hydrochlorothiazide (ZESTORETIC) 10-12.5 MG tablet   Age-related cataract   Benign prostatic hyperplasia with lower urinary tract symptoms   Relevant Medications   alfuzosin (UROXATRAL) 10 MG 24 hr tablet   History of colonic polyps   History of gout   Relevant Medications   allopurinol (ZYLOPRIM) 300 MG tablet   Other Relevant Orders   Uric Acid   Hyperlipidemia   Relevant Medications   lisinopril-hydrochlorothiazide (ZESTORETIC) 10-12.5 MG tablet   Morbid obesity (HCC)   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  Did recommend that he discuss further intervention for his back pain with Dr. For possible referral to orthopedics.    Sharlot Gowda, MD

## 2022-03-26 LAB — PSA: Prostate Specific Ag, Serum: 2.6 ng/mL (ref 0.0–4.0)

## 2022-03-26 LAB — URIC ACID: Uric Acid: 5.3 mg/dL (ref 3.8–8.4)

## 2022-03-27 ENCOUNTER — Telehealth: Payer: Self-pay | Admitting: Family Medicine

## 2022-03-27 NOTE — Telephone Encounter (Signed)
Pt called back, I would have told him his results but since they were only "okay" I didn't know if he would have questions. But he said he would be waiting by his phone when you are ready to call again.

## 2022-08-26 ENCOUNTER — Other Ambulatory Visit: Payer: Self-pay | Admitting: Family Medicine

## 2022-08-26 DIAGNOSIS — Z8739 Personal history of other diseases of the musculoskeletal system and connective tissue: Secondary | ICD-10-CM

## 2022-11-24 ENCOUNTER — Other Ambulatory Visit: Payer: Self-pay | Admitting: Family Medicine

## 2022-11-24 DIAGNOSIS — I10 Essential (primary) hypertension: Secondary | ICD-10-CM

## 2022-11-27 ENCOUNTER — Telehealth: Payer: Self-pay | Admitting: Family Medicine

## 2022-11-27 NOTE — Telephone Encounter (Signed)
Patient called, trying to get additional life insurance.  Life insurance told him he had Kidney issues.  He says he is unaware of any of this.  What is he being treated with for this, what is the plan for this?  Please advise patient [phone]  Chart shows Stage 3a Chronic Kidney 03/24/2022 and Renal Insufficiency 02/24/2022

## 2022-11-27 NOTE — Telephone Encounter (Signed)
Called patient back.  He states life insurance company is already putting him in the casket.  He asked that I send the note from Dr. Susann Givens to [email or web address].  Scheduled cpe for Jan.

## 2023-03-03 ENCOUNTER — Other Ambulatory Visit: Payer: Self-pay | Admitting: Family Medicine

## 2023-03-03 ENCOUNTER — Other Ambulatory Visit: Payer: Self-pay | Admitting: Medical

## 2023-03-03 DIAGNOSIS — I10 Essential (primary) hypertension: Secondary | ICD-10-CM

## 2023-03-03 DIAGNOSIS — Z8739 Personal history of other diseases of the musculoskeletal system and connective tissue: Secondary | ICD-10-CM

## 2023-03-03 NOTE — Telephone Encounter (Signed)
Pt has an appt in December

## 2023-03-18 ENCOUNTER — Ambulatory Visit: Payer: Medicare Other

## 2023-03-18 DIAGNOSIS — Z0000 Encounter for general adult medical examination without abnormal findings: Secondary | ICD-10-CM

## 2023-03-18 NOTE — Progress Notes (Signed)
Subjective:   Chase Wright. is a 77 y.o. male who presents for Medicare Annual/Subsequent preventive examination.  Visit Complete: Virtual I connected with  Chase Wright. on 03/18/23 by a audio enabled telemedicine application and verified that I am speaking with the correct person using two identifiers.  Patient Location: Home  Provider Location: Office/Clinic  I discussed the limitations of evaluation and management by telemedicine. The patient expressed understanding and agreed to proceed.  Vital Signs: Because this visit was a virtual/telehealth visit, some criteria may be missing or patient reported. Any vitals not documented were not able to be obtained and vitals that have been documented are patient reported.    Cardiac Risk Factors include: advanced age (>55men, >20 women);dyslipidemia;hypertension;male gender     Objective:    Today's Vitals   03/18/23 1056  PainSc: 5    There is no height or weight on file to calculate BMI.     03/18/2023   11:16 AM 03/15/2022   11:03 AM 02/21/2021    1:29 PM 10/23/2020    1:46 PM 07/12/2020   11:02 AM 07/11/2020    3:12 PM 01/27/2020    1:16 PM  Advanced Directives  Does Patient Have a Medical Advance Directive? Yes Yes Yes Yes Yes Yes Yes  Type of Estate agent of [employer] Power of [place];Living will Living will;Healthcare Power of [place];Out of facility DNR (pink MOST or yellow form) Healthcare Power of [place];Living will Out of facility DNR (pink MOST or yellow form);Healthcare Power of [place];Living will Out of facility DNR (pink MOST or yellow form) Healthcare Power of Attorney  Does patient want to make changes to medical advance directive?   No - Patient declined No - Patient declined No - Patient declined No - Patient declined   Copy of Healthcare Power of Attorney in Chart? No - copy requested Yes - validated most recent copy scanned in chart (See row information) Yes -  validated most recent copy scanned in chart (See row information)  No - copy requested  Yes - validated most recent copy scanned in chart (See row information)  Would patient like information on creating a medical advance directive?     No - Patient declined No - Patient declined   Pre-existing out of facility DNR order (yellow form or pink MOST form)     -- --     Current Medications (verified) Outpatient Encounter Medications as of 03/18/2023  Medication Sig   acidophilus (RISAQUAD) CAPS capsule Take 1 capsule by mouth daily.   alfuzosin (UROXATRAL) 10 MG 24 hr tablet Take 1 tablet (10 mg total) by mouth daily.   allopurinol (ZYLOPRIM) 300 MG tablet TAKE 1 TABLET BY MOUTH EVERY DAY   Artificial Tear Ointment (DRY EYES OP) Apply 1 drop to eye daily as needed (dry eyes).   aspirin 325 MG tablet Take 325 mg by mouth at bedtime.   Aspirin-Salicylamide-Caffeine (ARTHRITIS STRENGTH BC POWDER PO) Take by mouth.   B Complex-C (SUPER B COMPLEX PO) Take by mouth.   famotidine (PEPCID) 20 MG tablet Take 20 mg by mouth daily.    furosemide (LASIX) 20 MG tablet Take 20 mg by mouth as needed for edema. Takes when has swelling in feet   lisinopril-hydrochlorothiazide (ZESTORETIC) 10-12.5 MG tablet TAKE 1 TABLET BY MOUTH DAILY   ALPHA-LIPOIC ACID PO Take 1,000 mg by mouth. (Patient not taking: Reported on 03/18/2023)   No facility-administered encounter medications on file as of 03/18/2023.  Allergies (verified) Icosapent ethyl (epa ethyl ester) (fish), Praluent [alirocumab], and Statins   History: Past Medical History:  Diagnosis Date   ASHD (arteriosclerotic heart disease)    Chronic kidney disease    RENAL STONES   Diverticulitis    GERD (gastroesophageal reflux disease)    Hx of colonic polyps    Hypertension    Obesity    PUD (peptic ulcer disease)    Renal insufficiency    Past Surgical History:  Procedure Laterality Date   BYPASS GRAFT  08/16/1999   CARDIAC CATHETERIZATION   08/2005   NASAL ENDOSCOPY  2008   VIDEO BRONCHOSCOPY Bilateral 05/01/2018   Procedure: VIDEO BRONCHOSCOPY WITHOUT FLUORO;  Surgeon: Luciano Cutter, MD;  Location: [facility];  Service: Cardiopulmonary;  Laterality: Bilateral;   Family History  Problem Relation Age of Onset   Diabetes Mother    Prostate cancer Father 30   Colon cancer Neg Hx    Social History   Socioeconomic History   Marital status: Single    Spouse name: Not on file   Number of children: Not on file   Years of education: Not on file   Highest education level: Not on file  Occupational History   Not on file  Tobacco Use   Smoking status: Never   Smokeless tobacco: Never  Vaping Use   Vaping status: Never Used  Substance and Sexual Activity   Alcohol use: Yes    Comment: rare beer   Drug use: No   Sexual activity: Not Currently  Other Topics Concern   Not on file  Social History Narrative   Not on file   Social Determinants of Health   Financial Resource Strain: Low Risk  (03/18/2023)   Overall Financial Resource Strain (CARDIA)    Difficulty of Paying Living Expenses: Not hard at all  Food Insecurity: No Food Insecurity (03/18/2023)   Hunger Vital Sign    Worried About Running Out of Food in the Last Year: Never true    Ran Out of Food in the Last Year: Never true  Transportation Needs: No Transportation Needs (03/18/2023)   PRAPARE - Administrator, Civil Service (Medical): No    Lack of Transportation (Non-Medical): No  Physical Activity: Inactive (03/18/2023)   Exercise Vital Sign    Days of Exercise per Week: 0 days    Minutes of Exercise per Session: 0 min  Stress: No Stress Concern Present (03/18/2023)   [employer] of Occupational Health - Occupational Stress Questionnaire    Feeling of Stress : Only a little  Social Connections: Socially Isolated (03/18/2023)   Social Connection and Isolation Panel [NHANES]    Frequency of Communication with Friends and Family:  More than three times a week    Frequency of Social Gatherings with Friends and Family: Not on file    Attends Religious Services: Never    Database administrator or Organizations: No    Attends Engineer, structural: Never    Marital Status: Never married    Tobacco Counseling Counseling given: Not Answered   Clinical Intake:  Pre-visit preparation completed: Yes  Pain : 0-10 Pain Score: 5  Pain Type: Chronic pain Pain Location: Back Pain Orientation: Lower Pain Descriptors / Indicators: Aching Pain Onset: More than a month ago Pain Frequency: Constant     Nutritional Risks: None Diabetes: No  How often do you need to have someone help you when you read instructions, pamphlets, or other written materials from  your doctor or pharmacy?: 1 - Never  Interpreter Needed?: No  Information entered by :: NAllen LPN   Activities of Daily Living    03/18/2023   11:04 AM  In your present state of health, do you have any difficulty performing the following activities:  Hearing? 1  Comment decreased hearing left ear  Vision? 0  Difficulty concentrating or making decisions? 0  Walking or climbing stairs? 1  Dressing or bathing? 0  Doing errands, shopping? 0  Preparing Food and eating ? N  Using the Toilet? N  In the past six months, have you accidently leaked urine? N  Do you have problems with loss of bowel control? N  Managing your Medications? N  Managing your Finances? N  Housekeeping or managing your Housekeeping? N    Patient Care Team: Ronnald Nian, MD as PCP - General (Family Medicine)  Indicate any recent Medical Services you may have received from other than [facility] providers in the past year (date may be approximate).     Assessment:   This is a routine wellness examination for Chase Wright.  Hearing/Vision screen Hearing Screening - Comments:: Decreased hearing in one ear Vision Screening - Comments:: No regular eye exams,   Goals Addressed              This Visit's Progress    Patient Stated       03/18/2023, denies goals       Depression Screen    03/18/2023   11:21 AM 03/15/2022   11:04 AM 02/21/2021    1:32 PM 08/01/2020    1:26 PM 01/27/2020    1:18 PM 01/18/2019    1:45 PM 11/04/2017    1:32 PM  PHQ 2/9 Scores  PHQ - 2 Score 2 0 0 3 0 0 0  PHQ- 9 Score 7 3  9        Fall Risk    03/18/2023   11:19 AM 03/15/2022   11:03 AM 02/21/2021    1:30 PM 08/01/2020    1:26 PM 01/27/2020    1:18 PM  Fall Risk   Falls in the past year? 1 0 1 1 1   Comment gets a spasm in leg      Number falls in past yr: 1 0 1 1 1   Injury with Fall? 0 0 1 1 0  Risk for fall due to : History of fall(s);Impaired balance/gait;Impaired mobility;Medication side effect Impaired mobility;Impaired balance/gait;Medication side effect Impaired balance/gait Other (Comment)   Risk for fall due to: Comment    tripped   Follow up Falls prevention discussed;Falls evaluation completed Falls prevention discussed;Falls evaluation completed;Education provided Falls evaluation completed;Education provided Falls evaluation completed     MEDICARE RISK AT HOME: Medicare Risk at Home Any stairs in or around the home?: No If so, are there any without handrails?: No Home free of loose throw rugs in walkways, pet beds, electrical cords, etc?: Yes Adequate lighting in your home to reduce risk of falls?: Yes Life alert?: No Use of a cane, walker or w/c?: Yes Grab bars in the bathroom?: Yes Shower chair or bench in shower?: No Elevated toilet seat or a handicapped toilet?: No  TIMED UP AND GO:  Was the test performed?  No    Cognitive Function:        03/18/2023   11:24 AM 03/15/2022   11:08 AM  6CIT Screen  What Year? 0 points 0 points  What month? 0 points 0 points  What time? 0  points 0 points  Count back from 20 0 points 0 points  Months in reverse 0 points 0 points  Repeat phrase 0 points 0 points  Total Score 0 points 0 points     Immunizations Immunization History  Administered Date(s) Administered   DTaP 07/24/1992   Influenza Split 01/06/2012, 01/17/2013, 02/22/2014   Influenza Whole 01/19/1991, 02/11/2006, 12/31/2007   Influenza, High Dose Seasonal PF 12/09/2013, 11/20/2016, 12/24/2017, 12/01/2018, 12/01/2018, 12/28/2021, 01/06/2023   Influenza-Unspecified 01/06/2015, 12/31/2015, 11/20/2016, 11/19/2019, 01/08/2021   PFIZER(Purple Top)SARS-COV-2 Vaccination 06/08/2019, 07/06/2019, 01/06/2020, 07/10/2020, 01/08/2021   Pfizer(Comirnaty)Fall Seasonal Vaccine 12 years and older 01/11/2022, 01/06/2023   Pneumococcal Conjugate-13 10/24/2016   Pneumococcal Polysaccharide-23 10/06/2002, 02/25/2012   Respiratory Syncytial Virus Vaccine,Recomb Aduvanted(Arexvy) 01/22/2022   Tdap 01/06/2007, 11/20/2016, 07/11/2020   Zoster Recombinant(Shingrix) 11/20/2016, 05/13/2017   Zoster, Live 10/06/2002    TDAP status: Up to date  Flu Vaccine status: Up to date  Pneumococcal vaccine status: Up to date  Covid-19 vaccine status: Completed vaccines  Qualifies for Shingles Vaccine? Yes   Zostavax completed Yes   Shingrix Completed?: Yes  Screening Tests Health Maintenance  Topic Date Due   Fecal DNA (Cologuard)  03/09/2024   Medicare Annual Wellness (AWV)  03/17/2024   DTaP/Tdap/Td (5 - Td or Tdap) 07/12/2030   INFLUENZA VACCINE  Completed   COVID-19 Vaccine  Completed   Hepatitis C Screening  Completed   Zoster Vaccines- Shingrix  Completed   HPV VACCINES  Aged Out   Pneumonia Vaccine 42+ Years old  Discontinued    Health Maintenance  There are no preventive care reminders to display for this patient.   Colorectal cancer screening: Type of screening: Cologuard. Completed 03/09/2021. Repeat every 3 years  Lung Cancer Screening: (Low Dose CT Chest recommended if Age 63-80 years, 20 pack-year currently smoking OR have quit w/in 15years.) does not qualify.   Lung Cancer Screening Referral: no  Additional  Screening:  Hepatitis C Screening: does qualify; Completed 07/25/2016  Vision Screening: Recommended annual ophthalmology exams for early detection of glaucoma and other disorders of the eye. Is the patient up to date with their annual eye exam?  No  Who is the provider or what is the name of the office in which the patient attends annual eye exams? none If pt is not established with a provider, would they like to be referred to a provider to establish care? No .   Dental Screening: Recommended annual dental exams for proper oral hygiene  Diabetic Foot Exam: n/a  Community Resource Referral / Chronic Care Management: CRR required this visit?  No   CCM required this visit?  No     Plan:     I have personally reviewed and noted the following in the patient's chart:   Medical and social history Use of alcohol, tobacco or illicit drugs  Current medications and supplements including opioid prescriptions. Patient is not currently taking opioid prescriptions. Functional ability and status Nutritional status Physical activity Advanced directives List of other physicians Hospitalizations, surgeries, and ER visits in previous 12 months Vitals Screenings to include cognitive, depression, and falls Referrals and appointments  In addition, I have reviewed and discussed with patient certain preventive protocols, quality metrics, and best practice recommendations. A written personalized care plan for preventive services as well as general preventive health recommendations were provided to patient.     Barb Merino, LPN   [id number]   After Visit Summary: (Pick Up) Due to this being a telephonic visit, with patients personalized  plan was offered to patient and patient has requested to Pick up at office.  Nurse Notes: none

## 2023-03-18 NOTE — Patient Instructions (Signed)
Mr. Sipin , Thank you for taking time to come for your Medicare Wellness Visit. I appreciate your ongoing commitment to your health goals. Please review the following plan we discussed and let me know if I can assist you in the future.   Referrals/Orders/Follow-Ups/Clinician Recommendations: none  This is a list of the screening recommended for you and due dates:  Health Maintenance  Topic Date Due   Cologuard (Stool DNA test)  03/09/2024   Medicare Annual Wellness Visit  03/17/2024   DTaP/Tdap/Td vaccine (5 - Td or Tdap) 07/12/2030   Flu Shot  Completed   COVID-19 Vaccine  Completed   Hepatitis C Screening  Completed   Zoster (Shingles) Vaccine  Completed   HPV Vaccine  Aged Out   Pneumonia Vaccine  Discontinued    Advanced directives: (Copy Requested) Please bring a copy of your health care power of attorney and living will to the office to be added to your chart at your convenience.  Next Medicare Annual Wellness Visit scheduled for next year: Yes  insert Preventive Care attachment Insert FALL PREVENTION attachment if needed

## 2023-04-17 ENCOUNTER — Other Ambulatory Visit: Payer: Self-pay | Admitting: Family Medicine

## 2023-04-17 ENCOUNTER — Encounter: Payer: Self-pay | Admitting: Family Medicine

## 2023-04-17 ENCOUNTER — Ambulatory Visit: Payer: Medicare Other | Admitting: Family Medicine

## 2023-04-17 VITALS — BP 138/84 | HR 70 | Ht 62.5 in | Wt 247.0 lb

## 2023-04-17 DIAGNOSIS — F4321 Adjustment disorder with depressed mood: Secondary | ICD-10-CM

## 2023-04-17 DIAGNOSIS — I700 Atherosclerosis of aorta: Secondary | ICD-10-CM | POA: Diagnosis not present

## 2023-04-17 DIAGNOSIS — H25013 Cortical age-related cataract, bilateral: Secondary | ICD-10-CM | POA: Diagnosis not present

## 2023-04-17 DIAGNOSIS — Z0000 Encounter for general adult medical examination without abnormal findings: Secondary | ICD-10-CM

## 2023-04-17 DIAGNOSIS — Z789 Other specified health status: Secondary | ICD-10-CM

## 2023-04-17 DIAGNOSIS — E782 Mixed hyperlipidemia: Secondary | ICD-10-CM | POA: Diagnosis not present

## 2023-04-17 DIAGNOSIS — I10 Essential (primary) hypertension: Secondary | ICD-10-CM | POA: Diagnosis not present

## 2023-04-17 DIAGNOSIS — I2510 Atherosclerotic heart disease of native coronary artery without angina pectoris: Secondary | ICD-10-CM

## 2023-04-17 DIAGNOSIS — N3281 Overactive bladder: Secondary | ICD-10-CM | POA: Diagnosis not present

## 2023-04-17 DIAGNOSIS — Z87442 Personal history of urinary calculi: Secondary | ICD-10-CM

## 2023-04-17 DIAGNOSIS — H1033 Unspecified acute conjunctivitis, bilateral: Secondary | ICD-10-CM

## 2023-04-17 DIAGNOSIS — Z125 Encounter for screening for malignant neoplasm of prostate: Secondary | ICD-10-CM

## 2023-04-17 DIAGNOSIS — Z8739 Personal history of other diseases of the musculoskeletal system and connective tissue: Secondary | ICD-10-CM | POA: Diagnosis not present

## 2023-04-17 DIAGNOSIS — M1990 Unspecified osteoarthritis, unspecified site: Secondary | ICD-10-CM

## 2023-04-17 DIAGNOSIS — M791 Myalgia, unspecified site: Secondary | ICD-10-CM

## 2023-04-17 DIAGNOSIS — N1831 Chronic kidney disease, stage 3a: Secondary | ICD-10-CM | POA: Diagnosis not present

## 2023-04-17 DIAGNOSIS — Z860100 Personal history of colon polyps, unspecified: Secondary | ICD-10-CM

## 2023-04-17 DIAGNOSIS — N401 Enlarged prostate with lower urinary tract symptoms: Secondary | ICD-10-CM

## 2023-04-17 DIAGNOSIS — Z951 Presence of aortocoronary bypass graft: Secondary | ICD-10-CM

## 2023-04-17 DIAGNOSIS — F329 Major depressive disorder, single episode, unspecified: Secondary | ICD-10-CM

## 2023-04-17 MED ORDER — ALLOPURINOL 300 MG PO TABS: 300.0000 mg | ORAL_TABLET | Freq: Every day | ORAL | 0 refills | Status: DC

## 2023-04-17 MED ORDER — ERYTHROMYCIN 5 MG/GM OP OINT: 1.0000 | TOPICAL_OINTMENT | Freq: Every day | OPHTHALMIC | 0 refills | Status: DC

## 2023-04-17 MED ORDER — LISINOPRIL-HYDROCHLOROTHIAZIDE 10-12.5 MG PO TABS: 1.0000 | ORAL_TABLET | Freq: Every day | ORAL | 0 refills | Status: DC

## 2023-04-17 MED ORDER — FUROSEMIDE 20 MG PO TABS: 20.0000 mg | ORAL_TABLET | ORAL | 1 refills | Status: DC | PRN

## 2023-04-17 NOTE — Progress Notes (Signed)
 Complete physical exam  Patient: Chase Wright.   DOB: 01/09/1946   78 y.o. Male  MRN: [id number]  Subjective:    Chief Complaint  Patient presents with   Annual Exam   Eye Drainage    In the morning, eyelids sealed shut    Chest Pain    Horizontal vein across left chest that vibrates. Like a cell phone under his skin. Only 5 seconds at a time, now 2-3 times an hour. No lightheadedness or discomfort. Happens seated, lying down, or standing.     Chase I Goin Jr. is a 78 y.o. male who presents today for a complete physical exam. He reports consuming a general diet. Home exercise routine includes walking his dog. He generally feels fairly well. He reports sleeping poorly. He has had a lot of difficulty recently with drainage of purulent material from both eyes.  This has been going on for several months.  He also complains of a vibratory sensation at last roughly 5 seconds in the left chest area.  It is not associated with any physical activity, shortness of breath, diaphoresis.  It has been going on for about 6 months.  He has not seen cardiology in several years.  He also has a history of aortic atherosclerosis.  He has had difficulty with his low back and has had epidural injections and was followed by Dr. Eldonna for this.  He does have cataracts and is seen by ophthalmology.  Continues on allopurinol  for his underlying gout.  He is also taking Uroxatrol to help with his overactive bladder.  He has not had any kidney stones recently.  Continues on Lasix , lisinopril /HCTZ.  His weight is unchanged.  He has a previous history of colonic polyps but apparently has had Cologuard testing which was negative.  His partner of 50 years died 2 years ago.  He is still grieving the loss.  He would also like a PSA done. Most recent fall risk assessment:    03/18/2023   11:19 AM  Fall Risk   Falls in the past year? 1  Comment gets a spasm in leg  Number falls in past yr: 1  Injury with Fall? 0   Risk for fall due to : History of fall(s);Impaired balance/gait;Impaired mobility;Medication side effect  Follow up Falls prevention discussed;Falls evaluation completed     Most recent depression screenings:    03/18/2023   11:21 AM 03/15/2022   11:04 AM  PHQ 2/9 Scores  PHQ - 2 Score 2 0  PHQ- 9 Score 7 3    Vision:Not within last year  and Dental: No current dental problems and No regular dental care     Patient Care Team: Joyce Norleen BROCKS, MD as PCP - General (Family Medicine)   Outpatient Medications Prior to Visit  Medication Sig   acidophilus (RISAQUAD) CAPS capsule Take 1 capsule by mouth daily.   alfuzosin  (UROXATRAL ) 10 MG 24 hr tablet Take 1 tablet (10 mg total) by mouth daily.   Artificial Tear Ointment (DRY EYES OP) Apply 1 drop to eye daily as needed (dry eyes).   aspirin  325 MG tablet Take 325 mg by mouth at bedtime.   Aspirin -Salicylamide-Caffeine (ARTHRITIS STRENGTH BC POWDER PO) Take by mouth.   B Complex-C (SUPER B COMPLEX PO) Take by mouth.   famotidine  (PEPCID ) 20 MG tablet Take 20 mg by mouth daily.    ALPHA-LIPOIC ACID PO Take 1,000 mg by mouth. (Patient not taking: Reported on 04/17/2023)   [DISCONTINUED]  allopurinol  (ZYLOPRIM ) 300 MG tablet TAKE 1 TABLET BY MOUTH EVERY DAY   [DISCONTINUED] furosemide  (LASIX ) 20 MG tablet Take 20 mg by mouth as needed for edema. Takes when has swelling in feet   [DISCONTINUED] lisinopril -hydrochlorothiazide  (ZESTORETIC ) 10-12.5 MG tablet TAKE 1 TABLET BY MOUTH DAILY   No facility-administered medications prior to visit.    Review of Systems  All other systems reviewed and are negative.         Objective:     BP 138/84   Pulse 70   Ht 5' 2.5 (1.588 m)   Wt 247 lb (112 kg)   SpO2 98%   BMI 44.46 kg/m    Physical Exam  Alert and in no distress.  Exam of both conjunctiva does show some slight injection and purulent drainage from both eyes.  Tympanic membranes and canals are normal. Pharyngeal area is  normal. Neck is supple without adenopathy or thyromegaly. Cardiac exam shows a regular sinus rhythm without murmurs or gallops. Lungs are clear to auscultation.      Assessment & Plan:    Immunization History  Administered Date(s) Administered   DTaP 07/24/1992   Influenza Split 01/06/2012, 01/17/2013, 02/22/2014   Influenza Whole 01/19/1991, 02/11/2006, 12/31/2007   Influenza, High Dose Seasonal PF 12/09/2013, 11/20/2016, 12/24/2017, 12/01/2018, 12/01/2018, 12/28/2021, 01/06/2023   Influenza-Unspecified 01/06/2015, 12/31/2015, 11/20/2016, 11/19/2019, 01/08/2021   PFIZER(Purple Top)SARS-COV-2 Vaccination 06/08/2019, 07/06/2019, 01/06/2020, 07/10/2020, 01/08/2021   Pfizer(Comirnaty)Fall Seasonal Vaccine 12 years and older 01/11/2022, 01/06/2023   Pneumococcal Conjugate-13 10/24/2016   Pneumococcal Polysaccharide-23 10/06/2002, 02/25/2012   Respiratory Syncytial Virus Vaccine,Recomb Aduvanted(Arexvy) 01/22/2022   Tdap 01/06/2007, 11/20/2016, 07/11/2020   Zoster Recombinant(Shingrix) 11/20/2016, 05/13/2017   Zoster, Live 10/06/2002    Health Maintenance  Topic Date Due   Fecal DNA (Cologuard)  03/09/2024   Medicare Annual Wellness (AWV)  03/17/2024   DTaP/Tdap/Td (5 - Td or Tdap) 07/12/2030   INFLUENZA VACCINE  Completed   COVID-19 Vaccine  Completed   Hepatitis C Screening  Completed   Zoster Vaccines- Shingrix  Completed   HPV VACCINES  Aged Out   Pneumonia Vaccine 6+ Years old  Discontinued     Problem List Items Addressed This Visit     ASHD (arteriosclerotic heart disease) (Chronic)   Relevant Medications   lisinopril -hydrochlorothiazide  (ZESTORETIC ) 10-12.5 MG tablet   Other Relevant Orders   CBC with Differential/Platelet   Comprehensive metabolic panel   Lipid panel   Ambulatory referral to Cardiology   Hx of CABG (Chronic)   Relevant Orders   Ambulatory referral to Cardiology   Hypertension (Chronic)   Relevant Medications   lisinopril -hydrochlorothiazide   (ZESTORETIC ) 10-12.5 MG tablet   Other Relevant Orders   CBC with Differential/Platelet   Comprehensive metabolic panel   Abdominal aortic atherosclerosis (HCC)   Relevant Medications   lisinopril -hydrochlorothiazide  (ZESTORETIC ) 10-12.5 MG tablet   Age-related cataract   Arthritis   Relevant Medications   allopurinol  (ZYLOPRIM ) 300 MG tablet   Benign prostatic hyperplasia with lower urinary tract symptoms   History of colonic polyps   History of gout   Relevant Medications   allopurinol  (ZYLOPRIM ) 300 MG tablet   Other Relevant Orders   Uric Acid   History of renal stone   Hyperlipidemia   Relevant Medications   lisinopril -hydrochlorothiazide  (ZESTORETIC ) 10-12.5 MG tablet   Other Relevant Orders   Lipid panel   Morbid obesity (HCC)   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   OAB (overactive bladder)   Reactive depression   Stage 3a chronic kidney  disease [facility])   Other Visit Diagnoses       Routine general medical examination at a health care facility    -  Primary   Relevant Orders   CBC with Differential/Platelet   Comprehensive metabolic panel   Lipid panel     Acute bacterial conjunctivitis of both eyes       Relevant Medications   erythromycin  ophthalmic ointment     Screening for prostate cancer       Relevant Orders   PSA     I explained that there is no good reason why he is having his vibratory sensation in his chest but I am not overly worried about it.  I will set him up to see cardiology since he has not been seen there in quite some time.  Discussed the greeting that he is going through explained that with time this should slowly go away but he needs to give himself permission for it to go away.  He will continue on his present medication regiment.     Norleen Jobs, MD

## 2023-04-18 LAB — PSA: Prostate Specific Ag, Serum: 2.3 ng/mL (ref 0.0–4.0)

## 2023-04-18 LAB — COMPREHENSIVE METABOLIC PANEL
ALT: 16 [IU]/L (ref 0–44)
AST: 18 [IU]/L (ref 0–40)
Albumin: 4.3 g/dL (ref 3.8–4.8)
Alkaline Phosphatase: 80 [IU]/L (ref 44–121)
BUN/Creatinine Ratio: 14 (ref 10–24)
BUN: 16 mg/dL (ref 8–27)
Bilirubin Total: 0.8 mg/dL (ref 0.0–1.2)
CO2: 26 mmol/L (ref 20–29)
Calcium: 9.3 mg/dL (ref 8.6–10.2)
Chloride: 96 mmol/L (ref 96–106)
Creatinine, Ser: 1.17 mg/dL (ref 0.76–1.27)
Globulin, Total: 2.2 g/dL (ref 1.5–4.5)
Glucose: 84 mg/dL (ref 70–99)
Potassium: 4.4 mmol/L (ref 3.5–5.2)
Sodium: 140 mmol/L (ref 134–144)
Total Protein: 6.5 g/dL (ref 6.0–8.5)
eGFR: 65 mL/min/{1.73_m2} (ref >59)

## 2023-04-18 LAB — CBC WITH DIFFERENTIAL/PLATELET
Basophils Absolute: 0.1 10*3/uL (ref 0.0–0.2)
Basos: 1 %
EOS (ABSOLUTE): 0.4 10*3/uL (ref 0.0–0.4)
Eos: 4 %
Hematocrit: 45.6 % (ref 37.5–51.0)
Hemoglobin: 15.7 g/dL (ref 13.0–17.7)
Immature Grans (Abs): 0.1 10*3/uL (ref 0.0–0.1)
Immature Granulocytes: 1 %
Lymphocytes Absolute: 2.7 10*3/uL (ref 0.7–3.1)
Lymphs: 29 %
MCH: 33.1 pg — ABNORMAL HIGH (ref 26.6–33.0)
MCHC: 34.4 g/dL (ref 31.5–35.7)
MCV: 96 fL (ref 79–97)
Monocytes Absolute: 0.8 10*3/uL (ref 0.1–0.9)
Monocytes: 8 %
Neutrophils Absolute: 5.5 10*3/uL (ref 1.4–7.0)
Neutrophils: 57 %
Platelets: 264 10*3/uL (ref 150–450)
RBC: 4.75 x10E6/uL (ref 4.14–5.80)
RDW: 12.7 % (ref 11.6–15.4)
WBC: 9.6 10*3/uL (ref 3.4–10.8)

## 2023-04-18 LAB — LIPID PANEL
Chol/HDL Ratio: 6.2 {ratio} — ABNORMAL HIGH (ref 0.0–5.0)
Cholesterol, Total: 211 mg/dL — ABNORMAL HIGH (ref 100–199)
HDL: 34 mg/dL — ABNORMAL LOW (ref >39)
LDL Chol Calc (NIH): 142 mg/dL — ABNORMAL HIGH (ref 0–99)
Triglycerides: 195 mg/dL — ABNORMAL HIGH (ref 0–149)
VLDL Cholesterol Cal: 35 mg/dL (ref 5–40)

## 2023-04-18 LAB — URIC ACID: Uric Acid: 5.6 mg/dL (ref 3.8–8.4)

## 2023-05-01 ENCOUNTER — Other Ambulatory Visit: Payer: Self-pay | Admitting: Family Medicine

## 2023-05-01 DIAGNOSIS — N401 Enlarged prostate with lower urinary tract symptoms: Secondary | ICD-10-CM

## 2023-05-14 DIAGNOSIS — H04563 Stenosis of bilateral lacrimal punctum: Secondary | ICD-10-CM | POA: Diagnosis not present

## 2023-05-14 DIAGNOSIS — Q134 Other congenital corneal malformations: Secondary | ICD-10-CM | POA: Diagnosis not present

## 2023-06-04 DIAGNOSIS — H353132 Nonexudative age-related macular degeneration, bilateral, intermediate dry stage: Secondary | ICD-10-CM | POA: Diagnosis not present

## 2023-06-04 DIAGNOSIS — H524 Presbyopia: Secondary | ICD-10-CM | POA: Diagnosis not present

## 2023-06-04 DIAGNOSIS — H04563 Stenosis of bilateral lacrimal punctum: Secondary | ICD-10-CM | POA: Diagnosis not present

## 2023-06-04 DIAGNOSIS — D3132 Benign neoplasm of left choroid: Secondary | ICD-10-CM | POA: Diagnosis not present

## 2023-06-04 DIAGNOSIS — H25813 Combined forms of age-related cataract, bilateral: Secondary | ICD-10-CM | POA: Diagnosis not present

## 2023-06-13 IMAGING — CR DG LUMBAR SPINE COMPLETE 4+V
5 series · 5 of 5 positions shown · non-contrast
Comparison: CT abdomen pelvis dated 10/10/2020.

CLINICAL DATA: 75-year-old male with fall and back pain.

EXAM:
LUMBAR SPINE - COMPLETE 4+ VIEW

[t lumbar spine ap]
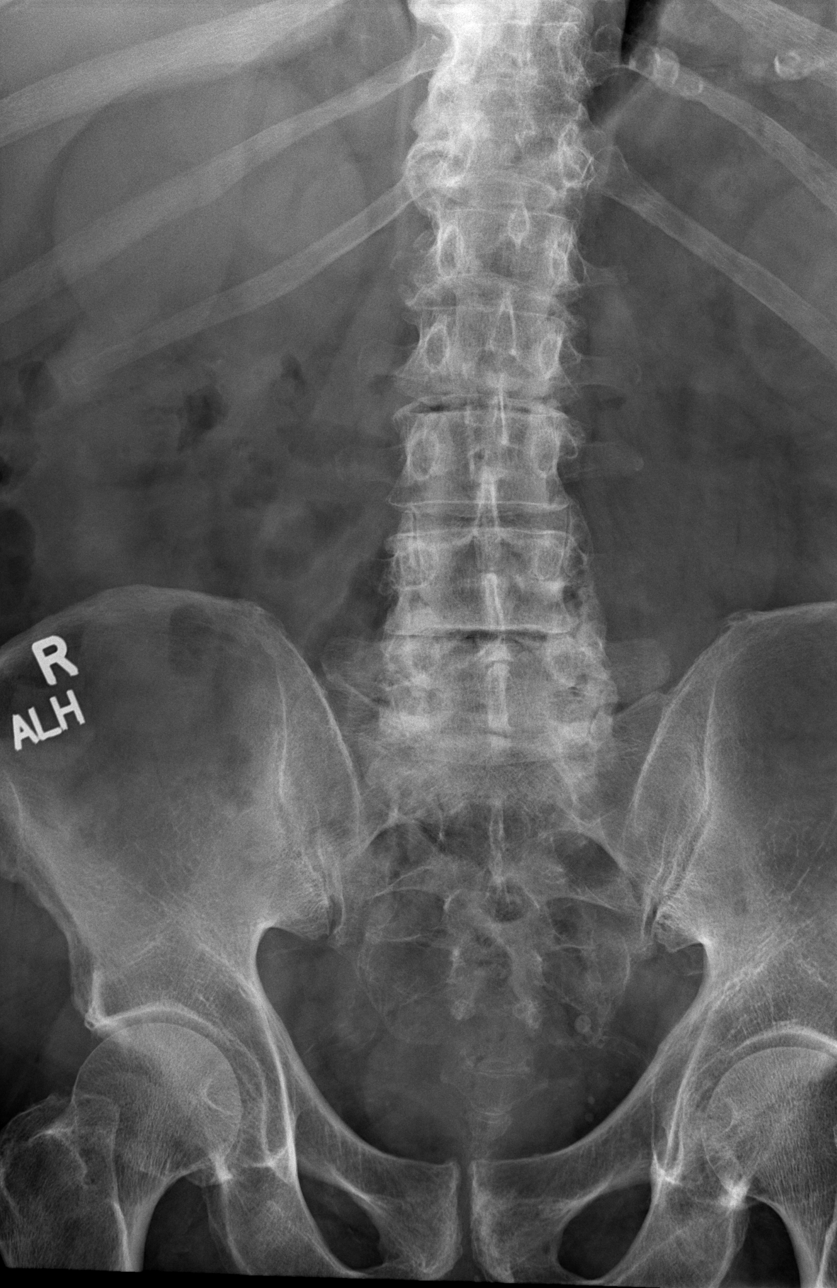

[t lumbar spine obl (1 of 2)]
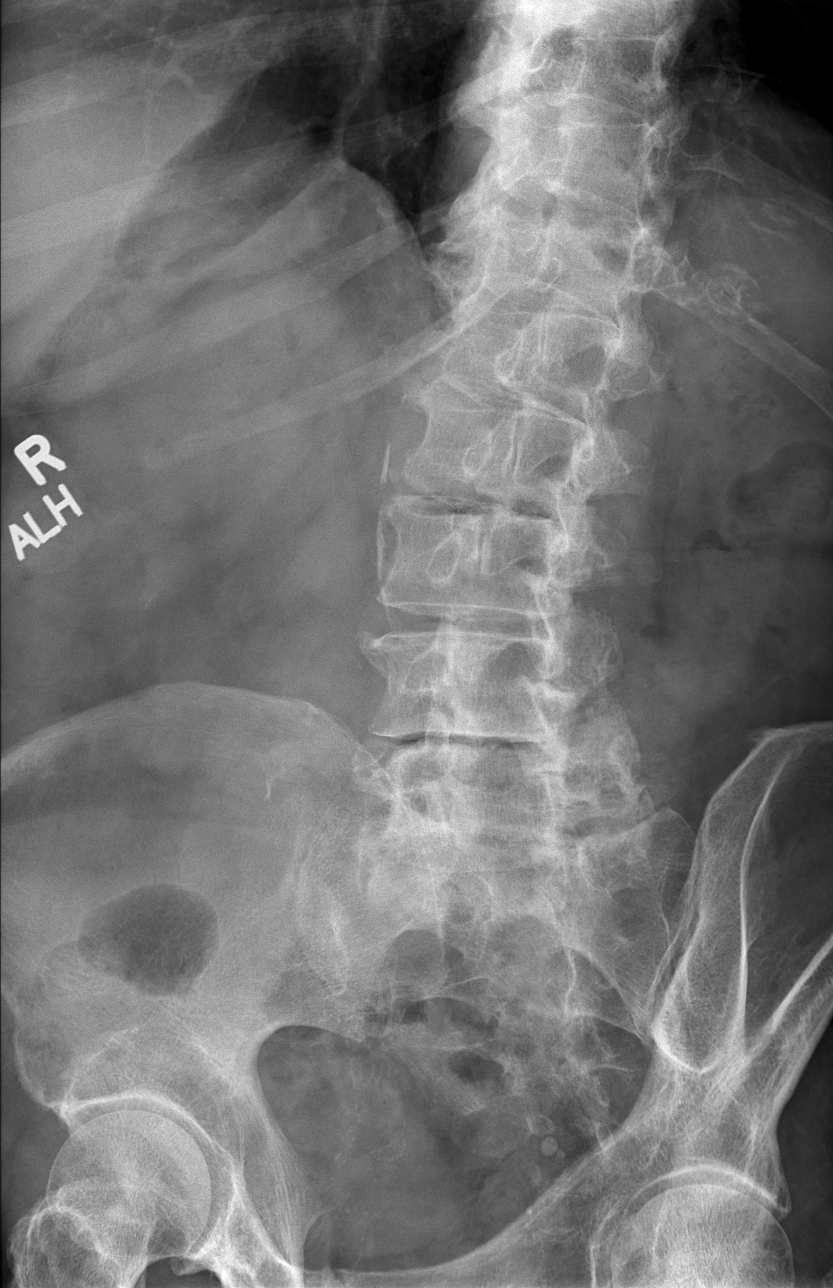

[t lumbar spine obl (2 of 2)]
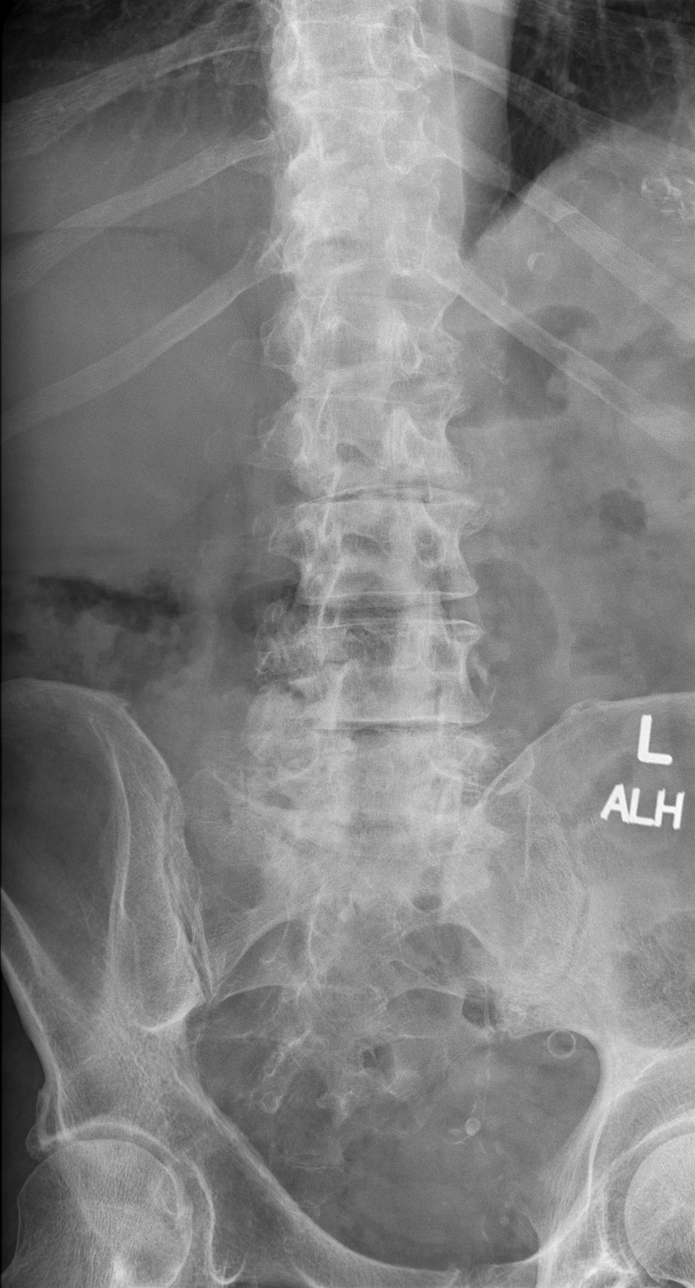

[t lumbar spine lat]
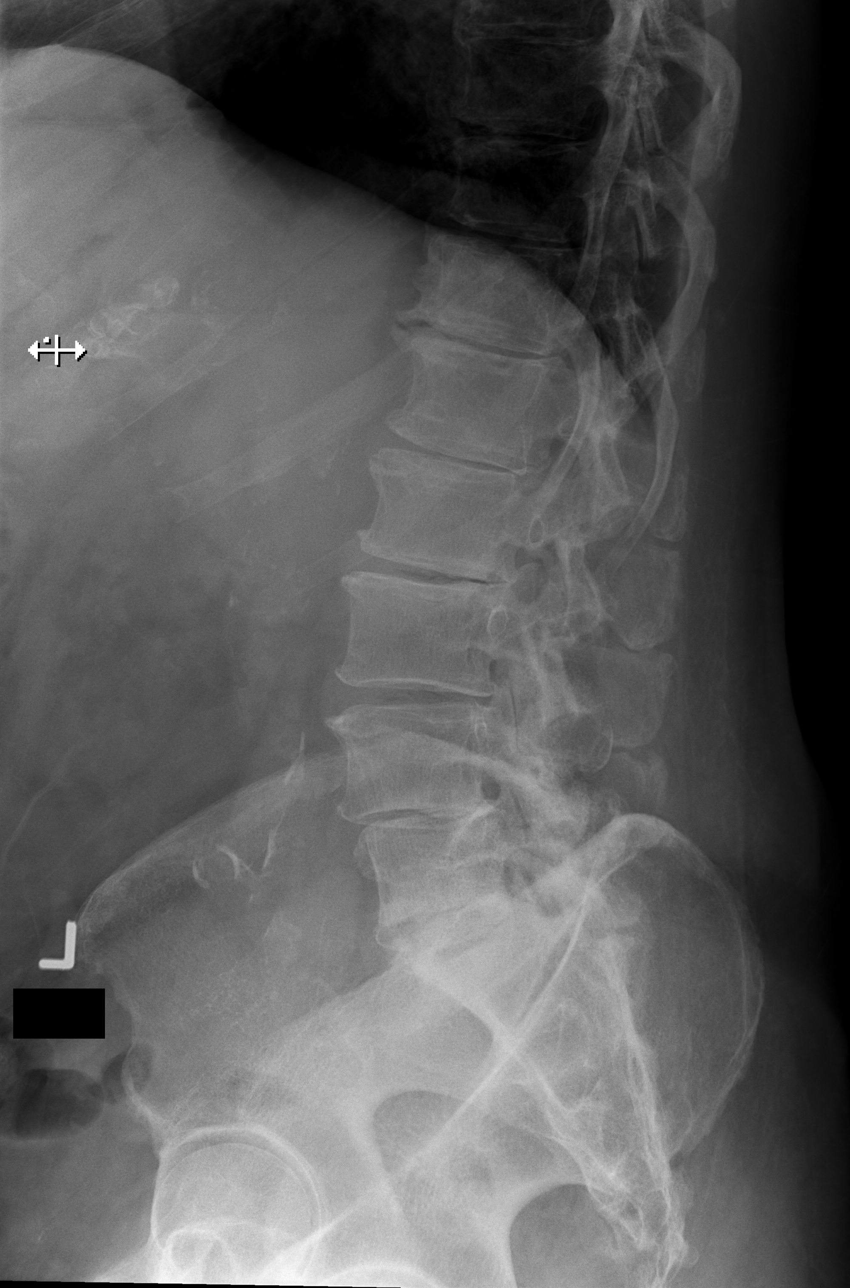

[t lumbar l-5 s-1 spot]
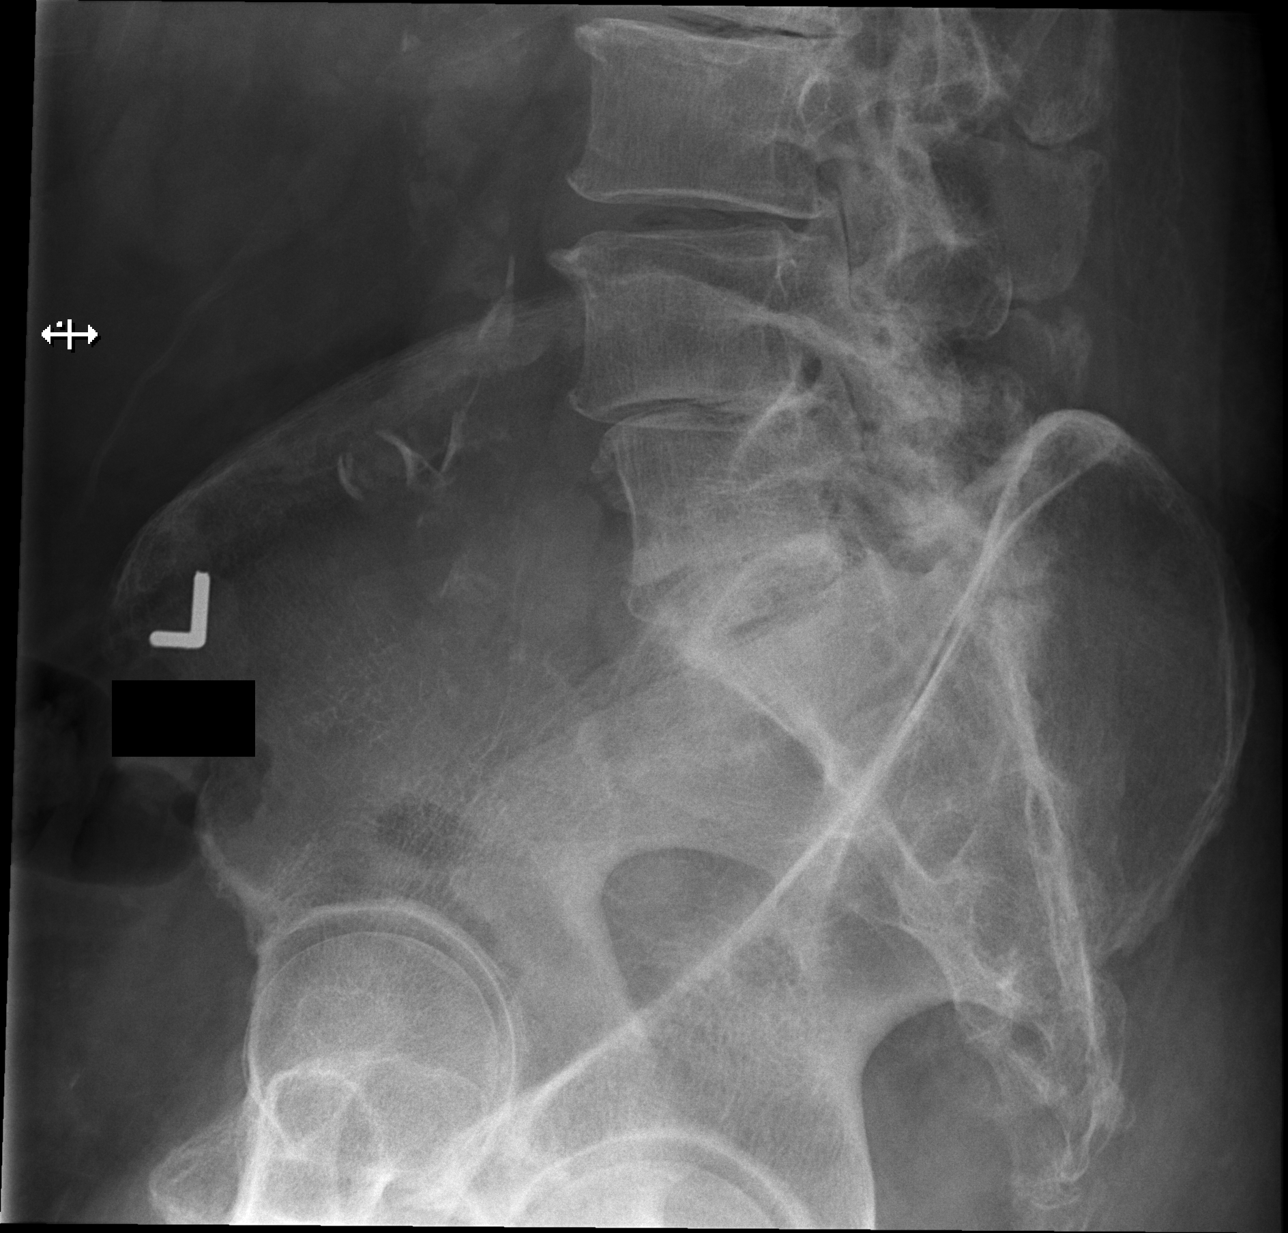

[5 of 5 positions shown; findings below may reference images not displayed]

FINDINGS: Five lumbar type vertebra. There is no acute fracture or subluxation
of the lumbar spine. There is osteopenia with multilevel
degenerative changes with disc space narrowing and endplate
irregularity and spurring. Multilevel facet arthropathy. The
visualized posterior elements are otherwise intact. There is
atherosclerotic calcification of the abdominal aorta. Rounded
structures in the bilateral flank most consistent with renal cysts
seen on the prior CT. The soft tissues are otherwise unremarkable.
IMPRESSION: 1. No acute/traumatic lumbar spine pathology.
2. Multilevel degenerative changes.

## 2023-06-19 ENCOUNTER — Encounter: Payer: Self-pay | Admitting: Cardiovascular Disease

## 2023-06-19 NOTE — Progress Notes (Unsigned)
 Cardiology Office Note:    Date:  06/20/2023   ID:  Chase Fort., DOB [birth year], MRN [id number]  PCP:  Ronnald Nian, MD   [facility] Providers Cardiologist:  Khale Nigh    Referring MD: Ronnald Nian, MD   Chief Complaint  Patient presents with   Coronary Artery Disease          Aug 10, 2020:  Chase Fort. is a 78 y.o. male with a hx of CAD, CABG, HTN, CKD I recently saw him int he hospital after he presented with episodes of CP myoview study was low risk. He has a mild inferobasal and apical inferior attenuation  Normal LV function   Was in the hospital a month ago  Is having lightheadedmess when he gets up in the am or after he bends over and stands backup   - orthostasis   Eats and drinks regularly  Knows that he needs to get more exercise   June 20, 2023 Chase Wright is seen for follow up of his CAD , cABG  He was last seen 3 years ago   Dr. Susann Givens asked him to come by for some ? Palpitations  Has "vibrations" on the left side of his chest  Not associated with any CP , no dizziness, no syncope , no dyspnea  These are not positional , not related to eating or drinking  Occurs 6-7 times a day   Will place a 3 day zio   Does not get any exercise due to back pain / arthritis   Takes 2 BC powders a day  I suggested ibuprofen instead of the BC powders   His LDL is 142  Has tried several statins  Has tried repatha - also had muscle aches  I suggested a lipid clinic referral to consider Inclisiran .  He thinks he has tried zetia previously    Past Medical History:  Diagnosis Date   ASHD (arteriosclerotic heart disease)    Chronic kidney disease    RENAL STONES   Diverticulitis    GERD (gastroesophageal reflux disease)    Hx of colonic polyps    Hypertension    Obesity    PUD (peptic ulcer disease)    Renal insufficiency     Past Surgical History:  Procedure Laterality Date   BYPASS GRAFT  08/16/1999   CARDIAC  CATHETERIZATION  08/2005   NASAL ENDOSCOPY  2008   VIDEO BRONCHOSCOPY Bilateral 05/01/2018   Procedure: VIDEO BRONCHOSCOPY WITHOUT FLUORO;  Surgeon: Luciano Cutter, MD;  Location: [facility];  Service: Cardiopulmonary;  Laterality: Bilateral;    Current Medications: Current Meds  Medication Sig   acidophilus (RISAQUAD) CAPS capsule Take 1 capsule by mouth daily.   alfuzosin (UROXATRAL) 10 MG 24 hr tablet TAKE 1 TABLET(10 MG) BY MOUTH DAILY   allopurinol (ZYLOPRIM) 300 MG tablet Take 1 tablet (300 mg total) by mouth daily.   ALPHA-LIPOIC ACID PO Take 1,000 mg by mouth.   Artificial Tear Ointment (DRY EYES OP) Apply 1 drop to eye daily as needed (dry eyes).   aspirin 325 MG tablet Take 325 mg by mouth at bedtime.   Aspirin-Salicylamide-Caffeine (ARTHRITIS STRENGTH BC POWDER PO) Take by mouth.   B Complex-C (SUPER B COMPLEX PO) Take by mouth.   erythromycin ophthalmic ointment Place 1 Application into both eyes at bedtime.   famotidine (PEPCID) 20 MG tablet Take 20 mg by mouth daily.    furosemide (LASIX) 20 MG tablet TAKE 1 TABLET BY  MOUTH AS NEEDED FOR EDEMA. TAKE WHEN SWELLING IN FEET   lisinopril-hydrochlorothiazide (ZESTORETIC) 10-12.5 MG tablet Take 1 tablet by mouth daily.     Allergies:   Icosapent ethyl (epa ethyl ester) (fish), Praluent [alirocumab], and Statins   Social History   Socioeconomic History   Marital status: Single    Spouse name: Not on file   Number of children: Not on file   Years of education: Not on file   Highest education level: Not on file  Occupational History   Not on file  Tobacco Use   Smoking status: Never   Smokeless tobacco: Never  Vaping Use   Vaping status: Never Used  Substance and Sexual Activity   Alcohol use: Yes    Comment: rare beer   Drug use: No   Sexual activity: Not Currently  Other Topics Concern   Not on file  Social History Narrative   Not on file   Social Drivers of Health   Financial Resource Strain: Low Risk   (03/18/2023)   Overall Financial Resource Strain (CARDIA)    Difficulty of Paying Living Expenses: Not hard at all  Food Insecurity: No Food Insecurity (03/18/2023)   Hunger Vital Sign    Worried About Running Out of Food in the Last Year: Never true    Ran Out of Food in the Last Year: Never true  Transportation Needs: No Transportation Needs (03/18/2023)   PRAPARE - Administrator, Civil Service (Medical): No    Lack of Transportation (Non-Medical): No  Physical Activity: Inactive (03/18/2023)   Exercise Vital Sign    Days of Exercise per Week: 0 days    Minutes of Exercise per Session: 0 min  Stress: No Stress Concern Present (03/18/2023)   [employer] of Occupational Health - Occupational Stress Questionnaire    Feeling of Stress : Only a little  Social Connections: Socially Isolated (03/18/2023)   Social Connection and Isolation Panel [NHANES]    Frequency of Communication with Friends and Family: More than three times a week    Frequency of Social Gatherings with Friends and Family: Not on file    Attends Religious Services: Never    Database administrator or Organizations: No    Attends Banker Meetings: Never    Marital Status: Never married     Family History: The patient's family history includes Diabetes in his mother; Prostate cancer (age of onset: 4) in his father. There is no history of Colon cancer.  ROS:   Please see the history of present illness.     All other systems reviewed and are negative.  EKGs/Labs/Other Studies Reviewed:    The following studies were reviewed today:   EKG:   EKG Interpretation Date/Time:  Friday June 20 2023 14:09:43 EDT Ventricular Rate:  70 PR Interval:  164 QRS Duration:  88 QT Interval:  356 QTC Calculation: 384 R Axis:   -56  Text Interpretation: Normal sinus rhythm Left anterior fascicular block Possible Anterior infarct , age undetermined , poor R wave progression When compared with ECG  of 11-Jul-2020 15:06, No significant change since last tracing Confirmed by Kristeen Miss [phone]) on 06/20/2023 2:26:01 PM     Recent Labs: 04/17/2023: ALT 16; BUN 16; Creatinine, Ser 1.17; Hemoglobin 15.7; Platelets 264; Potassium 4.4; Sodium 140  Recent Lipid Panel    Component Value Date/Time   CHOL 211 (H) 04/17/2023 1540   TRIG 195 (H) 04/17/2023 1540   HDL 34 (L) 04/17/2023 1540  CHOLHDL 6.2 (H) 04/17/2023 1540   CHOLHDL 6.2 (H) 07/25/2016 1356   VLDL 29 07/25/2016 1356   LDLCALC 142 (H) 04/17/2023 1540     Risk Assessment/Calculations:       Physical Exam:     Physical Exam: Blood pressure 136/84, pulse 70, height 5\' 6"  (1.676 m), weight 243 lb (110.2 kg), SpO2 93%.       GEN:  eldelry , moderately obese male  in no acute distress HEENT: Normal NECK: No JVD; No carotid bruits LYMPHATICS: No lymphadenopathy CARDIAC: RRR , no murmurs, rubs, gallops RESPIRATORY:  Clear to auscultation without rales, wheezing or rhonchi  ABDOMEN: Soft, non-tender, non-distended MUSCULOSKELETAL:  No edema; No deformity  SKIN: Warm and dry NEUROLOGIC:  Alert and oriented x 3   ASSESSMENT:    1. Primary hypertension   2. Hyperlipidemia, unspecified hyperlipidemia type   3. Palpitations     PLAN:      Coronary artery disease:   no angina   2.  Hyperlipidemia: His LDL is still very elevated.  He is tried statins and PCSK9 inhibitor.  He does not recall ever trying inclisiran.  Will refer him to our lipid clinic for consideration for inclisiran.  3.  Palpitations: He describes a buzzing sensation in his chest.  This happened 6 or 7 times every day.  Last for perhaps 10 seconds.  Will place a 3-day event monitor for further evaluation.  Will follow-up with [place] in 1 year.     Medication Adjustments/Labs and Tests Ordered: Current medicines are reviewed at length with the patient today.  Concerns regarding medicines are outlined above.  Orders Placed This Encounter   Procedures   AMB Referral to [facility] Pharm-D   LONG TERM MONITOR (3-14 DAYS)   EKG 12-Lead   No orders of the defined types were placed in this encounter.   Patient Instructions  Testing/Procedures: Ambulatory referral to Lipid clinic (discuss Inclisirin)  3-day zio heart monitor Your physician has recommended that you wear an event monitor. Event monitors are medical devices that record the heart's electrical activity. Doctors most often [place] these monitors to diagnose arrhythmias. Arrhythmias are problems with the speed or rhythm of the heartbeat. The monitor is a small, portable device. You can wear one while you do your normal daily activities. This is usually used to diagnose what is causing palpitations/syncope (passing out).  Follow-Up: At [facility], you and your health needs are our priority.  As part of our continuing mission to provide you with exceptional heart care, we have created designated Provider Care Teams.  These Care Teams include your primary Cardiologist (physician) and Advanced Practice Providers (APPs -  Physician Assistants and Nurse Practitioners) who all work together to provide you with the care you need, when you need it.  We recommend signing up for the patient portal called "MyChart".  Sign up information is provided on this After Visit Summary.  MyChart is used to connect with patients for Virtual Visits (Telemedicine).  Patients are able to view lab/test results, encounter notes, upcoming appointments, etc.  Non-urgent messages can be sent to your provider as well.   To learn more about what you can do with MyChart, go to [email or web address].    Your next appointment:   1 year(s)  Provider:   Kristeen Miss, MD   1st Floor: - Lobby - Registration  - Pharmacy  - Lab - Cafe  2nd Floor: - PV Lab - Diagnostic Testing (echo, CT, nuclear med)  3rd Floor: -  Vacant  4th Floor: - TCTS (cardiothoracic surgery) - AFib Clinic -  Structural Heart Clinic - Vascular Surgery  - Vascular Ultrasound  5th Floor: - HeartCare Cardiology (general and EP) - Clinical Pharmacy for coumadin, hypertension, lipid, weight-loss medications, and med management appointments    Valet parking services will be available as well.     Signed, Kristeen Miss, MD  06/20/2023 2:43 PM    [facility]

## 2023-06-20 ENCOUNTER — Ambulatory Visit: Payer: Medicare Other | Attending: Cardiology | Admitting: Cardiovascular Disease

## 2023-06-20 ENCOUNTER — Encounter: Payer: Self-pay | Admitting: Cardiovascular Disease

## 2023-06-20 ENCOUNTER — Ambulatory Visit (INDEPENDENT_AMBULATORY_CARE_PROVIDER_SITE_OTHER)

## 2023-06-20 ENCOUNTER — Other Ambulatory Visit: Payer: Self-pay | Admitting: Cardiovascular Disease

## 2023-06-20 VITALS — BP 136/84 | HR 70 | Ht 66.0 in | Wt 243.0 lb

## 2023-06-20 DIAGNOSIS — E785 Hyperlipidemia, unspecified: Secondary | ICD-10-CM

## 2023-06-20 DIAGNOSIS — I10 Essential (primary) hypertension: Secondary | ICD-10-CM | POA: Diagnosis not present

## 2023-06-20 DIAGNOSIS — R002 Palpitations: Secondary | ICD-10-CM

## 2023-06-20 NOTE — Progress Notes (Unsigned)
 ZIO serial # [id number] V from office inventory applied to patient.

## 2023-06-20 NOTE — Patient Instructions (Signed)
 Testing/Procedures: Ambulatory referral to Lipid clinic (discuss Inclisirin)  3-day zio heart monitor Your physician has recommended that you wear an event monitor. Event monitors are medical devices that record the heart's electrical activity. Doctors most often [place] these monitors to diagnose arrhythmias. Arrhythmias are problems with the speed or rhythm of the heartbeat. The monitor is a small, portable device. You can wear one while you do your normal daily activities. This is usually used to diagnose what is causing palpitations/syncope (passing out).  Follow-Up: At [facility], you and your health needs are our priority.  As part of our continuing mission to provide you with exceptional heart care, we have created designated Provider Care Teams.  These Care Teams include your primary Cardiologist (physician) and Advanced Practice Providers (APPs -  Physician Assistants and Nurse Practitioners) who all work together to provide you with the care you need, when you need it.  We recommend signing up for the patient portal called "MyChart".  Sign up information is provided on this After Visit Summary.  MyChart is used to connect with patients for Virtual Visits (Telemedicine).  Patients are able to view lab/test results, encounter notes, upcoming appointments, etc.  Non-urgent messages can be sent to your provider as well.   To learn more about what you can do with MyChart, go to [email or web address].    Your next appointment:   1 year(s)  Provider:   Kristeen Miss, MD   1st Floor: - Lobby - Registration  - Pharmacy  - Lab - Cafe  2nd Floor: - PV Lab - Diagnostic Testing (echo, CT, nuclear med)  3rd Floor: - Vacant  4th Floor: - TCTS (cardiothoracic surgery) - AFib Clinic - Structural Heart Clinic - Vascular Surgery  - Vascular Ultrasound  5th Floor: - HeartCare Cardiology (general and EP) - Clinical Pharmacy for coumadin, hypertension, lipid, weight-loss  medications, and med management appointments    Valet parking services will be available as well.

## 2023-07-03 DIAGNOSIS — I10 Essential (primary) hypertension: Secondary | ICD-10-CM | POA: Diagnosis not present

## 2023-07-03 DIAGNOSIS — I4719 Other supraventricular tachycardia: Secondary | ICD-10-CM | POA: Diagnosis not present

## 2023-07-03 DIAGNOSIS — R002 Palpitations: Secondary | ICD-10-CM | POA: Diagnosis not present

## 2023-07-08 ENCOUNTER — Telehealth: Payer: Self-pay

## 2023-07-08 NOTE — Telephone Encounter (Signed)
 Caleld and spoke with patient regarding monitor findings. He states that he is too scared to take anything (medication) that would affect his blood pressure in any way. He states that since it's not overly bothersome and not life-threatening, he will just continue as-is.

## 2023-07-08 NOTE — Telephone Encounter (Signed)
-----   Message from Kristeen Miss sent at 07/07/2023  7:50 PM EDT ----- He presents with vibrations in his chest The zio monitor shows episodes of nonsustained SVT .  These may be the cause of his palpitations but dont seem to be serious at this point   Lets add metoprolol 25 mg po Bid to see if it helps with these palpitations

## 2023-08-15 ENCOUNTER — Other Ambulatory Visit: Payer: Self-pay | Admitting: Family Medicine

## 2023-08-15 DIAGNOSIS — Z8739 Personal history of other diseases of the musculoskeletal system and connective tissue: Secondary | ICD-10-CM

## 2023-08-15 DIAGNOSIS — I10 Essential (primary) hypertension: Secondary | ICD-10-CM

## 2023-10-14 ENCOUNTER — Ambulatory Visit: Payer: Self-pay

## 2023-10-14 ENCOUNTER — Telehealth: Payer: Self-pay | Admitting: Family Medicine

## 2023-10-14 DIAGNOSIS — R197 Diarrhea, unspecified: Secondary | ICD-10-CM

## 2023-10-14 NOTE — Telephone Encounter (Signed)
 FYI Only or Action Required?: FYI only for provider.  Patient was last seen in primary care on 04/17/2023 by Joyce Norleen BROCKS, MD.  Called Nurse Triage reporting Diarrhea.  Symptoms began about a month ago.  Interventions attempted: OTC medications: imodium.  Symptoms are: unchanged.  Triage Disposition: See PCP When Office is Open (Within 3 Days)  Patient/caregiver understands and will follow disposition?: No  Reason for Disposition  [1] Mild diarrhea (e.g., 1-3 or more stools than normal in past 24 hours) without known cause AND [2] present >  7 days  Answer Assessment - Initial Assessment Questions 1. DIARRHEA SEVERITY: How bad is the diarrhea? How many more stools have you had in the past 24 hours than normal?    - NO DIARRHEA (SCALE 0)   - MILD (SCALE 1-3): Few loose or mushy BMs; increase of 1-3 stools over normal daily number of stools; mild increase in ostomy output.   -  MODERATE (SCALE 4-7): Increase of 4-6 stools daily over normal; moderate increase in ostomy output.   -  SEVERE (SCALE 8-10; OR WORST POSSIBLE): Increase of 7 or more stools daily over normal; moderate increase in ostomy output; incontinence.     Mild, usually right after pt eats 2. ONSET: When did the diarrhea begin?      About a month 3. BM CONSISTENCY: How loose or watery is the diarrhea?      watery 4. VOMITING: Are you also vomiting? If Yes, ask: How many times in the past 24 hours?      denies 5. ABDOMEN PAIN: Are you having any abdomen pain? If Yes, ask: What does it feel like? (e.g., crampy, dull, intermittent, constant)      RLQ pain 6. ABDOMEN PAIN SEVERITY: If present, ask: How bad is the pain?  (e.g., Scale 1-10; mild, moderate, or severe)   - MILD (1-3): doesn't interfere with normal activities, abdomen soft and not tender to touch    - MODERATE (4-7): interferes with normal activities or awakens from sleep, abdomen tender to touch    - SEVERE (8-10): excruciating pain,  doubled over, unable to do any normal activities       Initially was all the time-when taking ASA, has gone away since stopped ASA 7. ORAL INTAKE: If vomiting, Have you been able to drink liquids? How much liquids have you had in the past 24 hours?     Still drinking liquids okay 8. HYDRATION: Any signs of dehydration? (e.g., dry mouth [not just dry lips], too weak to stand, dizziness, new weight loss) When did you last urinate?     Denies s/s of dehydration 9. EXPOSURE: Have you traveled to a foreign country recently? Have you been exposed to anyone with diarrhea? Could you have eaten any food that was spoiled?     denies 10. ANTIBIOTIC USE: Are you taking antibiotics now or have you taken antibiotics in the past 2 months?       denies 11. OTHER SYMPTOMS: Do you have any other symptoms? (e.g., fever, blood in stool)       Denies  Pt offered appts this week, states that he cannot come this week as he needs to plan a ride with [place]. Pt requested appt next week, scheduled 7/16.  Protocols used: [facility]

## 2023-10-14 NOTE — Telephone Encounter (Signed)
 Copied from CRM [id number]. Topic: Clinical - Pink Word Triage >> Oct 14, 2023 11:10 AM Marissa P wrote: Reason for Triage: Patient has had diarrhea for over a month and would like to see doctor asap if possible please

## 2023-10-14 NOTE — Telephone Encounter (Signed)
 RN preformed NT, see NT encounter.

## 2023-10-22 ENCOUNTER — Ambulatory Visit (INDEPENDENT_AMBULATORY_CARE_PROVIDER_SITE_OTHER): Admitting: Family Medicine

## 2023-10-22 VITALS — BP 126/90 | HR 67 | Wt 239.6 lb

## 2023-10-22 DIAGNOSIS — I2510 Atherosclerotic heart disease of native coronary artery without angina pectoris: Secondary | ICD-10-CM | POA: Diagnosis not present

## 2023-10-22 DIAGNOSIS — R195 Other fecal abnormalities: Secondary | ICD-10-CM | POA: Diagnosis not present

## 2023-10-22 NOTE — Progress Notes (Signed)
   Subjective:    Patient ID: Chase Wright., male    DOB: October 14, 1945, 78 y.o.   MRN: [id number]  HPI He is here for evaluation of a 1 month history of diarrhea.  He states he has 1 BM a day that starts out normal and then becomes more liquid.  He has tried to mitigate this with aspirin  with some minimal success.  He is also used Imodium.  He has had no fever, chills, change in stools.  He is taking a probiotic.  He has stopped all aspirin  containing compounds.  He was also seen recently by cardiology for evaluation of a vibration sensation in his chest and they gave him a beta-blocker however he is uncomfortable with taking it.   Review of Systems     Objective:    Physical Exam Alert and in no distress.  Cardiac exam shows regular rhythm without murmurs gallops.  Lungs clear to auscultation.  Abdominal exam shows active bowel sounds with minimal discomfort with palpation but no true rebound.       Assessment & Plan:  Loose stools  ASHD (arteriosclerotic heart disease) Discussed the use of the beta-blocker and explained that this is to slow his heart rate down.  He was told that this is not life-threatening so he will probably hold off on taking it. I then discussed treatment of the loose stools with Pepcid  which she has been on in the past, continue with probiotic and try MiraLAX to see what this will do to help with symptoms./.

## 2023-10-22 NOTE — Patient Instructions (Signed)
 Go ahead and start on Pepcid  again daily.  Try MiraLAX stay on the probiotic as well and call in about a week let me know how you are doing

## 2023-10-27 NOTE — Addendum Note (Signed)
 Addended by: JOYCE NORLEEN BROCKS on: 10/27/2023 12:04 PM   Modules accepted: Orders

## 2023-10-27 NOTE — Telephone Encounter (Unsigned)
 Copied from CRM [id number]. Topic: Clinical - Medical Advice >> Oct 27, 2023 10:21 AM Graeme ORN wrote: Reason for CRM: Patient called. Was seen last week. Not getting better. Still has diarrhea. Would like to leave message for PCP. Has been taking super probiotics and pecid. Would like to know what else can been done or what test he can take that are not too expensive or intrusive to find out what's going on. Thank You

## 2023-10-28 ENCOUNTER — Other Ambulatory Visit

## 2023-10-28 DIAGNOSIS — R195 Other fecal abnormalities: Secondary | ICD-10-CM

## 2023-10-28 DIAGNOSIS — R197 Diarrhea, unspecified: Secondary | ICD-10-CM

## 2023-10-30 ENCOUNTER — Telehealth: Payer: Self-pay

## 2023-10-30 NOTE — Telephone Encounter (Signed)
 Copied from CRM #[id number]. Topic: Clinical - Lab/Test Results >> Oct 30, 2023  3:03 PM Graeme ORN wrote: Reason for CRM: Patient called to check on lab results. Let her know have not yet been reviewed by provider. Patient would like a call back with results once reviewed. Thank You  Do not have results yet will call patient once reviewed

## 2023-10-31 ENCOUNTER — Ambulatory Visit: Payer: Self-pay | Admitting: Family Medicine

## 2023-11-01 LAB — CDIFF NAA+O+P+STOOL CULTURE
E coli, Shiga toxin Assay: NEGATIVE
E coli, Shiga toxin Assay: NEGATIVE
Toxigenic C. Difficile by PCR: NEGATIVE
Toxigenic C. Difficile by PCR: NEGATIVE

## 2023-11-04 NOTE — Telephone Encounter (Signed)
 Pt called this morning still vomiting and he is still awaiting a call from GI. I have called them and they ask for him to call in to schedule an appt. He wants to know if he should go somewhere in the mean time to have an ultrasound done? Or wait for GI appt.

## 2023-11-04 NOTE — Telephone Encounter (Signed)
 Chase Wright got an appt. with [facility] for tomorrow. Just an FYI

## 2023-11-05 ENCOUNTER — Other Ambulatory Visit (INDEPENDENT_AMBULATORY_CARE_PROVIDER_SITE_OTHER)

## 2023-11-05 ENCOUNTER — Encounter: Payer: Self-pay | Admitting: Gastroenterology

## 2023-11-05 ENCOUNTER — Ambulatory Visit: Admitting: Gastroenterology

## 2023-11-05 VITALS — BP 100/62 | HR 64 | Ht 62.0 in | Wt 235.0 lb

## 2023-11-05 DIAGNOSIS — R1011 Right upper quadrant pain: Secondary | ICD-10-CM

## 2023-11-05 DIAGNOSIS — R159 Full incontinence of feces: Secondary | ICD-10-CM | POA: Insufficient documentation

## 2023-11-05 DIAGNOSIS — R1084 Generalized abdominal pain: Secondary | ICD-10-CM | POA: Insufficient documentation

## 2023-11-05 DIAGNOSIS — R197 Diarrhea, unspecified: Secondary | ICD-10-CM

## 2023-11-05 LAB — BASIC METABOLIC PANEL WITH GFR
BUN: 22 mg/dL (ref 6–23)
CO2: 27 meq/L (ref 19–32)
Calcium: 9.6 mg/dL (ref 8.4–10.5)
Chloride: 102 meq/L (ref 96–112)
Creatinine, Ser: 1.32 mg/dL (ref 0.40–1.50)
GFR: 52.51 mL/min — ABNORMAL LOW (ref >60.00)
Glucose, Bld: 95 mg/dL (ref 70–99)
Potassium: 4.1 meq/L (ref 3.5–5.1)
Sodium: 140 meq/L (ref 135–145)

## 2023-11-05 NOTE — Patient Instructions (Signed)
 Your provider has requested that you go to the basement level for lab work before leaving today. Press B on the elevator. The lab is located at the first door on the left as you exit the elevator.  You have been scheduled for a CT scan of the abdomen and pelvis at [facility], 1st floor Radiology. You are scheduled on 11/11/23 at 3:30pm. You should arrive 2 hours (1:15pm) prior to your appointment time for registration.    You may take any medications as prescribed with a small amount of water, if necessary. If you take any of the following medications: METFORMIN, GLUCOPHAGE, GLUCOVANCE, AVANDAMET, RIOMET, FORTAMET, ACTOPLUS MET, JANUMET, GLUMETZA or METAGLIP, you MAY be asked to HOLD this medication 48 hours AFTER the exam.   The purpose of you drinking the oral contrast is to aid in the visualization of your intestinal tract. The contrast solution may cause some diarrhea. Depending on your individual set of symptoms, you may also receive an intravenous injection of x-ray contrast/dye. Plan on being at [facility] for 45 minutes or longer, depending on the type of exam you are having performed.   If you have any questions regarding your exam or if you need to reschedule, you may call [facility] at [phone] between the hours of 8:00 am and 5:00 pm, Monday-Friday.    _______________________________________________________  If your blood pressure at your visit was 140/90 or greater, please contact your primary care physician to follow up on this.  _______________________________________________________  If you are age 26 or older, your body mass index should be between 23-30. Your Body mass index is 42.98 kg/m. If this is out of the aforementioned range listed, please consider follow up with your Primary Care Provider.  If you are age 16 or younger, your body mass index should be between 19-25. Your Body mass index is 42.98 kg/m. If this is out of the aformentioned range  listed, please consider follow up with your Primary Care Provider.   ________________________________________________________  The [facility] providers would like to encourage you to use MYCHART to communicate with providers for non-urgent requests or questions.  Due to long hold times on the telephone, sending your provider a message by [facility] may be a faster and more efficient way to get a response.  Please allow 48 business hours for a response.  Please remember that this is for non-urgent requests.  _______________________________________________________  [facility] is using a team-based approach to care.  Your team is made up of your doctor and two to three APPS. Our APPS (Nurse Practitioners and Physician Assistants) work with your physician to ensure care continuity for you. They are fully qualified to address your health concerns and develop a treatment plan. They communicate directly with your gastroenterologist to care for you. Seeing the Advanced Practice Practitioners on your physician's team can help you by facilitating care more promptly, often allowing for earlier appointments, access to diagnostic testing, procedures, and other specialty referrals.   Thank you for entrusting me with your care and for choosing [employer] - C.

## 2023-11-05 NOTE — Progress Notes (Signed)
 11/05/2023 Chase Wright [id number] 1946-01-11   HISTORY OF PRESENT ILLNESS: This is a 78 year old male who is a patient of Dr. Nancyann.  Was assigned to Dr. Abran when seen by one of our nurse practitioners in 2019.  Has not been seen here since that time.  He presents here today with complaints of diarrhea and abdominal pain.  This diarrhea he describes as being just once a day after eating, but has a lot of urgency.  Has had episodes of incontinence.  No nocturnal stools.  He said that this started about 6 weeks ago.  He tells me he was using a lot of BC type powders for his back pain, etc.  He started having some right upper quadrant abdominal pain so abruptly stopped taking the BC powders.  Around that same time he developed these diarrhea issues.  He says that prior to that he was having normal firm brown stools.  He still has some discomfort in the right upper quadrant, but also describes a generalized habitual stomach ache.  He says that he has lost about 10 pounds.  He is taking some Imodium and says that it helps a little bit.  He says that he did not have any bowel movement yesterday and has not had one yet today, but he did take an Imodium this morning in order to be able to come to this appointment.  No rectal bleeding.  PCP checked stool culture and C. difficile as well as O&P, which were all negative.  Otherwise no new medications and no significant changes in his diet other than he is not eating as much and is eating more bland.  Negative Cologuard December 2022.  Past Medical History:  Diagnosis Date   ASHD (arteriosclerotic heart disease)    Chronic kidney disease    RENAL STONES   Diverticulitis    GERD (gastroesophageal reflux disease)    Hx of colonic polyps    Hypertension    Obesity    PUD (peptic ulcer disease)    Renal insufficiency    Past Surgical History:  Procedure Laterality Date   BYPASS GRAFT  08/16/1999   CARDIAC CATHETERIZATION  08/2005    NASAL ENDOSCOPY  2008   VIDEO BRONCHOSCOPY Bilateral 05/01/2018   Procedure: VIDEO BRONCHOSCOPY WITHOUT FLUORO;  Surgeon: Kassie Acquanetta Bradley, MD;  Location: [facility];  Service: Cardiopulmonary;  Laterality: Bilateral;    reports that he has never smoked. He has never used smokeless tobacco. He reports current alcohol use. He reports that he does not use drugs. family history includes Diabetes in his mother; Prostate cancer (age of onset: 75) in his father. Allergies  Allergen Reactions   Icosapent  Ethyl (Epa Ethyl Ester) (Fish) Other (See Comments)    Muscle ache   Praluent  [Alirocumab ] Other (See Comments)    Muscle pain, feel very sick   Statins Other (See Comments)    Muscle pain      Outpatient Encounter Medications as of 11/05/2023  Medication Sig   acidophilus (RISAQUAD) CAPS capsule Take 1 capsule by mouth daily.   alfuzosin  (UROXATRAL ) 10 MG 24 hr tablet TAKE 1 TABLET(10 MG) BY MOUTH DAILY   allopurinol  (ZYLOPRIM ) 300 MG tablet TAKE 1 TABLET(300 MG) BY MOUTH DAILY   Artificial Tear Ointment (DRY EYES OP) Apply 1 drop to eye daily as needed (dry eyes).   B Complex-C (SUPER B COMPLEX PO) Take by mouth.   famotidine  (PEPCID ) 20 MG tablet Take 20 mg by mouth daily.  (  Patient taking differently: Take 20 mg by mouth daily as needed for heartburn or indigestion.)   furosemide  (LASIX ) 20 MG tablet TAKE 1 TABLET BY MOUTH AS NEEDED FOR EDEMA. TAKE WHEN SWELLING IN FEET (Patient taking differently: Take 20 mg by mouth as needed for fluid or edema.)   lisinopril -hydrochlorothiazide  (ZESTORETIC ) 10-12.5 MG tablet TAKE 1 TABLET BY MOUTH DAILY   [DISCONTINUED] ALPHA-LIPOIC ACID PO Take 1,000 mg by mouth.   [DISCONTINUED] aspirin  325 MG tablet Take 325 mg by mouth at bedtime. (Patient not taking: Reported on 10/22/2023)   [DISCONTINUED] Aspirin -Salicylamide-Caffeine (ARTHRITIS STRENGTH BC POWDER PO) Take by mouth. (Patient not taking: Reported on 10/22/2023)   [DISCONTINUED] erythromycin   ophthalmic ointment Place 1 Application into both eyes at bedtime. (Patient not taking: Reported on 10/22/2023)   No facility-administered encounter medications on file as of 11/05/2023.     REVIEW OF SYSTEMS  : All other systems reviewed and negative except where noted in the History of Present Illness.   PHYSICAL EXAM: BP 100/62   Pulse 64   Ht 5' 2 (1.575 m)   Wt 235 lb (106.6 kg)   BMI 42.98 kg/m  General: Well developed white male in no acute distress Head: Normocephalic and atraumatic Eyes:  Sclerae anicteric,conjunctive pink. Ears: Normal auditory acuity Lungs: Clear throughout to auscultation; no W/R/R. Heart: Regular rate and rhythm; no M/R/G. Abdomen: Soft, non-distended.  BS present.  Mild diffuse TTP. Musculoskeletal: Symmetrical with no gross deformities  Skin: No lesions on visible extremities Extremities: No edema  Neurological: Alert oriented x 4, grossly non-focal Psychological:  Alert and cooperative. Normal mood and affect  ASSESSMENT AND PLAN: *78 year old with complaints of diarrhea x 6 weeks.  Really just describes 1 episode of loose or watery stool daily after eating, but has a lot of urgency with it.  Started with some right upper quadrant abdominal pain and now has some diffuse discomfort that he describes as an ache.  Stool culture, O&P, and C. difficile are negative.  No significant changes in diet, medication, etc.  He is on allopurinol , but has been on that for years.  -Will check CT scan abdomen and pelvis with contrast.  BMP today. - He is reluctant to colonoscopy, but will proceed if needed particularly if diarrhea continues and CT scan is unrevealing.   CC:  Joyce Norleen BROCKS, MD

## 2023-11-06 NOTE — Progress Notes (Signed)
 Noted

## 2023-11-11 ENCOUNTER — Ambulatory Visit (HOSPITAL_COMMUNITY)
Admission: RE | Admit: 2023-11-11 | Discharge: 2023-11-11 | Disposition: A | Discharge status: Home / Self Care | Source: Ambulatory Visit | Attending: Gastroenterology | Admitting: Gastroenterology

## 2023-11-11 DIAGNOSIS — R159 Full incontinence of feces: Secondary | ICD-10-CM | POA: Diagnosis present

## 2023-11-11 DIAGNOSIS — N281 Cyst of kidney, acquired: Secondary | ICD-10-CM | POA: Diagnosis not present

## 2023-11-11 DIAGNOSIS — R197 Diarrhea, unspecified: Secondary | ICD-10-CM | POA: Insufficient documentation

## 2023-11-11 DIAGNOSIS — K5730 Diverticulosis of large intestine without perforation or abscess without bleeding: Secondary | ICD-10-CM | POA: Diagnosis not present

## 2023-11-11 DIAGNOSIS — R1011 Right upper quadrant pain: Secondary | ICD-10-CM | POA: Diagnosis not present

## 2023-11-11 MED ORDER — IOHEXOL 9 MG/ML PO SOLN
1000.0000 mL | ORAL | Status: AC
  Administered 2023-11-11: 1000 mL via ORAL

## 2023-11-11 MED ORDER — IOHEXOL 300 MG/ML  SOLN
100.0000 mL | Freq: Once | INTRAMUSCULAR | Status: AC | PRN
  Administered 2023-11-11: 100 mL via INTRAVENOUS

## 2023-11-13 ENCOUNTER — Ambulatory Visit: Payer: Self-pay | Admitting: Physician Assistant

## 2023-11-13 NOTE — Telephone Encounter (Signed)
 Spoke with patient he got results from CT scan of abdomen & pelvis, they have found nothing wrong, however he is still having issues with diarrhea, [place] suggested having a colonoscopy. He doesn't want to do that but he wants your input. Please let me know and ill call him Monday with what you advise.

## 2023-11-18 ENCOUNTER — Encounter: Payer: Self-pay | Admitting: Internal Medicine

## 2023-11-27 ENCOUNTER — Telehealth: Payer: Self-pay | Admitting: Gastroenterology

## 2023-11-27 NOTE — Telephone Encounter (Signed)
 Inbound call from patient requesting a call to discuss CT and labwork results further. States he has further questions and concerns. Please advise, thank you

## 2023-11-28 NOTE — Telephone Encounter (Signed)
 Called and spoke with patient. Patient wanted to make sure CT scan did not show any abnormality. I reassured patient that there were no acute findings to cause his diarrhea. Patient states that after eating he feels like he has a stomach ache and about 30 minutes later he will have diarrhea. Patient states that Imodium works but he when he does have another BM it is still diarrhea. I told patient that the next step is the colonoscopy. I offered to move up appt but patient prefers to keep as scheduled. Patient states that he will stick with the plan. Patient was advised to let us  know if he has any questions in the interim. Patient verbalized understanding.

## 2023-12-10 ENCOUNTER — Encounter: Payer: Self-pay | Admitting: Internal Medicine

## 2023-12-10 ENCOUNTER — Ambulatory Visit (AMBULATORY_SURGERY_CENTER)

## 2023-12-10 VITALS — Ht 66.0 in | Wt 235.0 lb

## 2023-12-10 DIAGNOSIS — R159 Full incontinence of feces: Secondary | ICD-10-CM

## 2023-12-10 DIAGNOSIS — R1011 Right upper quadrant pain: Secondary | ICD-10-CM

## 2023-12-10 DIAGNOSIS — R1084 Generalized abdominal pain: Secondary | ICD-10-CM

## 2023-12-10 DIAGNOSIS — R197 Diarrhea, unspecified: Secondary | ICD-10-CM

## 2023-12-10 MED ORDER — SUTAB 1479-225-188 MG PO TABS: 12.0000 | ORAL_TABLET | ORAL | 0 refills | Status: AC

## 2023-12-10 NOTE — Progress Notes (Signed)
 No egg or soy allergy known to patient  No issues known to pt with past sedation with any surgeries or procedures Patient denies ever being told they had issues or difficulty with intubation  No FH of Malignant Hyperthermia Pt is not on diet pills Pt is not on  home 02  Pt is not on blood thinners  Pt denies issues with constipation  Diarrhea: Yes  No A fib or A flutter Have any cardiac testing pending-- no LOA: independent with cane  Prep: sutab   Patient's chart reviewed by Norleen Schillings CNRA prior to previsit and patient appropriate for the [facility].  Previsit completed and red dot placed by patient's name on their procedure day (on provider's schedule).     PV completed with patient. Prep instructions sent via mychart and home address.

## 2023-12-23 ENCOUNTER — Encounter: Payer: Self-pay | Admitting: Internal Medicine

## 2023-12-23 ENCOUNTER — Ambulatory Visit: Admitting: Internal Medicine

## 2023-12-23 VITALS — BP 138/78 | HR 62 | Temp 89.1°F | Resp 13 | Ht 66.0 in | Wt 235.0 lb

## 2023-12-23 DIAGNOSIS — D122 Benign neoplasm of ascending colon: Secondary | ICD-10-CM

## 2023-12-23 DIAGNOSIS — K5730 Diverticulosis of large intestine without perforation or abscess without bleeding: Secondary | ICD-10-CM

## 2023-12-23 DIAGNOSIS — D123 Benign neoplasm of transverse colon: Secondary | ICD-10-CM

## 2023-12-23 DIAGNOSIS — R1084 Generalized abdominal pain: Secondary | ICD-10-CM

## 2023-12-23 DIAGNOSIS — R197 Diarrhea, unspecified: Secondary | ICD-10-CM | POA: Diagnosis not present

## 2023-12-23 DIAGNOSIS — R159 Full incontinence of feces: Secondary | ICD-10-CM

## 2023-12-23 DIAGNOSIS — D120 Benign neoplasm of cecum: Secondary | ICD-10-CM

## 2023-12-23 DIAGNOSIS — K635 Polyp of colon: Secondary | ICD-10-CM | POA: Diagnosis not present

## 2023-12-23 DIAGNOSIS — R1011 Right upper quadrant pain: Secondary | ICD-10-CM | POA: Diagnosis not present

## 2023-12-23 DIAGNOSIS — I10 Essential (primary) hypertension: Secondary | ICD-10-CM | POA: Diagnosis not present

## 2023-12-23 MED ORDER — SODIUM CHLORIDE 0.9 % IV SOLN: 500.0000 mL | Freq: Once | INTRAVENOUS | Status: DC

## 2023-12-23 NOTE — Progress Notes (Signed)
 Report to PACU, RN, vss, BBS= Clear.

## 2023-12-23 NOTE — Progress Notes (Signed)
 Expand All Collapse All        11/05/2023 Chase Wright [id number] [birth year]     HISTORY OF PRESENT ILLNESS: This is a 78 year old male who is a patient of Dr. Nancyann.  Was assigned to Dr. Abran when seen by one of our nurse practitioners in 2019.  Has not been seen here since that time.  He presents here today with complaints of diarrhea and abdominal pain.  This diarrhea he describes as being just once a day after eating, but has a lot of urgency.  Has had episodes of incontinence.  No nocturnal stools.  He said that this started about 6 weeks ago.  He tells me he was using a lot of BC type powders for his back pain, etc.  He started having some right upper quadrant abdominal pain so abruptly stopped taking the BC powders.  Around that same time he developed these diarrhea issues.  He says that prior to that he was having normal firm brown stools.  He still has some discomfort in the right upper quadrant, but also describes a generalized habitual stomach ache.  He says that he has lost about 10 pounds.  He is taking some Imodium and says that it helps a little bit.  He says that he did not have any bowel movement yesterday and has not had one yet today, but he did take an Imodium this morning in order to be able to come to this appointment.  No rectal bleeding.  PCP checked stool culture and C. difficile as well as O&P, which were all negative.  Otherwise no new medications and no significant changes in his diet other than he is not eating as much and is eating more bland.   Negative Cologuard December 2022.       Past Medical History:  Diagnosis Date   ASHD (arteriosclerotic heart disease)     Chronic kidney disease      RENAL STONES   Diverticulitis     GERD (gastroesophageal reflux disease)     Hx of colonic polyps     Hypertension     Obesity     PUD (peptic ulcer disease)     Renal insufficiency               Past Surgical History:  Procedure Laterality Date   BYPASS  GRAFT   08/16/1999   CARDIAC CATHETERIZATION   08/2005   NASAL ENDOSCOPY   2008   VIDEO BRONCHOSCOPY Bilateral 05/01/2018    Procedure: VIDEO BRONCHOSCOPY WITHOUT FLUORO;  Surgeon: Kassie Acquanetta Bradley, MD;  Location: [facility];  Service: Cardiopulmonary;  Laterality: Bilateral;         reports that he has never smoked. He has never used smokeless tobacco. He reports current alcohol use. He reports that he does not use drugs. family history includes Diabetes in his mother; Prostate cancer (age of onset: 72) in his father. Allergies       Allergies  Allergen Reactions   Icosapent  Ethyl (Epa Ethyl Ester) (Fish) Other (See Comments)      Muscle ache   Praluent  [Alirocumab ] Other (See Comments)      Muscle pain, feel very sick   Statins Other (See Comments)      Muscle pain              Outpatient Encounter Medications as of 11/05/2023  Medication Sig   acidophilus (RISAQUAD) CAPS capsule Take 1 capsule by mouth daily.   alfuzosin  (UROXATRAL ) 10 MG  24 hr tablet TAKE 1 TABLET(10 MG) BY MOUTH DAILY   allopurinol  (ZYLOPRIM ) 300 MG tablet TAKE 1 TABLET(300 MG) BY MOUTH DAILY   Artificial Tear Ointment (DRY EYES OP) Apply 1 drop to eye daily as needed (dry eyes).   B Complex-C (SUPER B COMPLEX PO) Take by mouth.   famotidine  (PEPCID ) 20 MG tablet Take 20 mg by mouth daily.  (Patient taking differently: Take 20 mg by mouth daily as needed for heartburn or indigestion.)   furosemide  (LASIX ) 20 MG tablet TAKE 1 TABLET BY MOUTH AS NEEDED FOR EDEMA. TAKE WHEN SWELLING IN FEET (Patient taking differently: Take 20 mg by mouth as needed for fluid or edema.)   lisinopril -hydrochlorothiazide  (ZESTORETIC ) 10-12.5 MG tablet TAKE 1 TABLET BY MOUTH DAILY   [DISCONTINUED] ALPHA-LIPOIC ACID PO Take 1,000 mg by mouth.   [DISCONTINUED] aspirin  325 MG tablet Take 325 mg by mouth at bedtime. (Patient not taking: Reported on 10/22/2023)   [DISCONTINUED] Aspirin -Salicylamide-Caffeine (ARTHRITIS STRENGTH BC POWDER  PO) Take by mouth. (Patient not taking: Reported on 10/22/2023)   [DISCONTINUED] erythromycin  ophthalmic ointment Place 1 Application into both eyes at bedtime. (Patient not taking: Reported on 10/22/2023)      No facility-administered encounter medications on file as of 11/05/2023.          REVIEW OF SYSTEMS  : All other systems reviewed and negative except where noted in the History of Present Illness.     PHYSICAL EXAM: BP 100/62   Pulse 64   Ht 5' 2 (1.575 m)   Wt 235 lb (106.6 kg)   BMI 42.98 kg/m  General: Well developed white male in no acute distress Head: Normocephalic and atraumatic Eyes:  Sclerae anicteric,conjunctive pink. Ears: Normal auditory acuity Lungs: Clear throughout to auscultation; no W/R/R. Heart: Regular rate and rhythm; no M/R/G. Abdomen: Soft, non-distended.  BS present.  Mild diffuse TTP. Musculoskeletal: Symmetrical with no gross deformities  Skin: No lesions on visible extremities Extremities: No edema  Neurological: Alert oriented x 4, grossly non-focal Psychological:  Alert and cooperative. Normal mood and affect   ASSESSMENT AND PLAN: *78 year old with complaints of diarrhea x 6 weeks.  Really just describes 1 episode of loose or watery stool daily after eating, but has a lot of urgency with it.  Started with some right upper quadrant abdominal pain and now has some diffuse discomfort that he describes as an ache.  Stool culture, O&P, and C. difficile are negative.  No significant changes in diet, medication, etc.  He is on allopurinol , but has been on that for years.   -Will check CT scan abdomen and pelvis with contrast.  BMP today. - He is reluctant to colonoscopy, but will proceed if needed particularly if diarrhea continues and CT scan is unrevealing.     CC:  Joyce Norleen BROCKS, MD

## 2023-12-23 NOTE — Progress Notes (Signed)
 Called to room to assist during endoscopic procedure.  Patient ID and intended procedure confirmed with present staff. Received instructions for my participation in the procedure from the performing physician.

## 2023-12-23 NOTE — Op Note (Signed)
 [facility] Patient Name: Chase Wright Procedure Date: 12/23/2023 2:46 PM MRN: [id number] Endoscopist: Norleen SAILOR. Abran , MD, [id number] Age: 78 Referring MD:  Date of Birth: 09-Mar-1946 Gender: Male Account #: [id number] Procedure:                Colonoscopy with cold snare polypectomy x 5; with                            biopsies Indications:              Clinically significant diarrhea of unexplained                            origin, urgency, incontinence. Prior colonoscopy                            2005 was negative for neoplasia Medicines:                Monitored Anesthesia Care Procedure:                Pre-Anesthesia Assessment:                           - Prior to the procedure, a History and Physical                            was performed, and patient medications and                            allergies were reviewed. The patient's tolerance of                            previous anesthesia was also reviewed. The risks                            and benefits of the procedure and the sedation                            options and risks were discussed with the patient.                            All questions were answered, and informed consent                            was obtained. Prior Anticoagulants: The patient has                            taken no anticoagulant or antiplatelet agents. ASA                            Grade Assessment: II - A patient with mild systemic                            disease. After reviewing the risks and benefits,  the patient was deemed in satisfactory condition to                            undergo the procedure.                           After obtaining informed consent, the colonoscope                            was passed under direct vision. Throughout the                            procedure, the patient's blood pressure, pulse, and                            oxygen saturations were monitored  continuously. The                            Olympus Scope [id number] was introduced through the                            anus and advanced to the the cecum, identified by                            appendiceal orifice and ileocecal valve. The                            ileocecal valve, appendiceal orifice, and rectum                            were photographed. The quality of the bowel                            preparation was good. The colonoscopy was performed                            without difficulty. The patient tolerated the                            procedure well. The bowel preparation used was                            SUPREP via split dose instruction. Scope In: 3:19:14 PM Scope Out: 3:38:10 PM Scope Withdrawal Time: 0 hours 17 minutes 6 seconds  Total Procedure Duration: 0 hours 18 minutes 56 seconds  Findings:                 Five polyps were found in the transverse colon,                            ascending colon and cecum. The polyps were 3 to 9                            mm in size. These polyps were removed with  a cold                            snare. Resection and retrieval were complete.                           Multiple diverticula were found in the left colon.                           The exam was otherwise without abnormality on                            direct and retroflexion views.                           Biopsies for histology were taken with a cold                            forceps from the entire colon for evaluation of                            microscopic colitis. Complications:            No immediate complications. Estimated blood loss:                            None. Estimated Blood Loss:     Estimated blood loss: none. Impression:               - Five 3 to 9 mm polyps in the transverse colon, in                            the ascending colon and in the cecum, removed with                            a cold snare. Resected and  retrieved.                           - Diverticulosis in the left colon.                           - The examination was otherwise normal on direct                            and retroflexion views.                           - Biopsies were taken with a cold forceps from the                            entire colon for evaluation of microscopic colitis. Recommendation:           - Repeat colonoscopy is not recommended for                            surveillance.                           -  Patient has a contact number available for                            emergencies. The signs and symptoms of potential                            delayed complications were discussed with the                            patient. Return to normal activities tomorrow.                            Written discharge instructions were provided to the                            patient.                           - Resume previous diet.                           - Continue present medications.                           - Await pathology results. We will contact you                            regarding results, recommendations, and follow-up. Norleen SAILOR. Abran, MD 12/23/2023 3:48:59 PM This report has been signed electronically.

## 2023-12-23 NOTE — Progress Notes (Signed)
 Pt's states no medical or surgical changes since previsit or office visit.

## 2023-12-23 NOTE — Patient Instructions (Signed)

## 2023-12-24 ENCOUNTER — Telehealth: Payer: Self-pay

## 2023-12-24 NOTE — Telephone Encounter (Signed)
 No answer on follow up call - voice mail message left

## 2023-12-25 ENCOUNTER — Telehealth: Payer: Self-pay | Admitting: Internal Medicine

## 2023-12-25 NOTE — Telephone Encounter (Signed)
 Pt wanted to make Dr. Abran aware that Tues pm he ate a chix pot pie and had some gatorade for dinner and all went well. Wed he states he ate some noodles with tuna and mayo and had about 6 tbsp and later he had massive diarrhea. He drank a glass of water an hour later and after that he had diarrhea and didn't make it to the bathroom. Asked pt is he took any imodium and he states he has told Dr. Abran that it hurts his stomach. Let pt know that biopsies were taken at the time of colonoscopy 2 days ago to see if there is a cause for the diarrhea but those results are not back yet. He just wanted to make Dr Abran aware.

## 2023-12-25 NOTE — Telephone Encounter (Signed)
 Noted. Pt knows we are waiting on biopsy results.

## 2023-12-25 NOTE — Telephone Encounter (Signed)
 Received a call from patient stating he is experiencing chronic diarrhea and is not making it to the bathroom on time. Patient is requesting follow up call to discuss ongoing symptoms and any solutions. Please review and advise   Thank you

## 2023-12-25 NOTE — Telephone Encounter (Signed)
 No new recommendations at this point

## 2023-12-26 LAB — SURGICAL PATHOLOGY

## 2023-12-29 ENCOUNTER — Ambulatory Visit: Payer: Self-pay | Admitting: Internal Medicine

## 2024-01-07 ENCOUNTER — Telehealth: Payer: Self-pay

## 2024-01-07 NOTE — Telephone Encounter (Signed)
 I received a letter from patient stating that he had his colonoscopy by Dr. Norleen Kiang. They removed 5 non malignant growths, They found nothing else. He is still having diarrhea for over 4 months now. He is still taking imodium AD and Citrucel pills which helps some. I spoke with Dr. Joyce about his letter and suggests that he get in sooner with his GI. He has an appt on 02/26/24 F/U and he is trying to get in sooner.

## 2024-01-22 ENCOUNTER — Ambulatory Visit: Payer: Self-pay

## 2024-01-22 NOTE — Telephone Encounter (Signed)
  FYI Only or Action Required?: FYI only for provider.  Patient was last seen in primary care on 10/22/2023 by Joyce Norleen BROCKS, MD.  Called Nurse Triage reporting Cough.  Symptoms began a week ago.  Interventions attempted: OTC medications: Mucinex and Rest, hydration, or home remedies.  Symptoms are: gradually worsening.  Triage Disposition: See Physician Within 24 Hours  Patient/caregiver understands and will follow disposition?: Yes  Reason for Disposition  SEVERE coughing spells (e.g., whooping sound after coughing, vomiting after coughing)  Answer Assessment - Initial Assessment Questions Covid test x3 times, negative. Mucinex and cough syrup helps a little, then it comes back. Patient was previously scheduled for Monday by [facility], advised patient to be seen sooner, rescheduled appointment for 10/17 with Dr. Perri.   1. ONSET: When did the cough begin?      Last week  2. SEVERITY: How bad is the cough today?      Wet cough, non productive  3. SPUTUM: Describe the color of your sputum (e.g., none, dry cough; clear, white, yellow, green)     Non productive, feels wet but unable to spit anything up  4. DIFFICULTY BREATHING: Are you having difficulty breathing? If Yes, ask: How bad is it? (e.g., mild, moderate, severe)      Denies  5. FEVER: Do you have a fever? If Yes, ask: What is your temperature, how was it measured, and when did it start?     Denies  6. CARDIAC HISTORY: Do you have any history of heart disease? (e.g., heart attack, congestive heart failure)      Heart bypass in 2001  7. LUNG HISTORY: Do you have any history of lung disease?  (e.g., pulmonary embolus, asthma, emphysema)     Denies  8. OTHER SYMPTOMS: Do you have any other symptoms? (e.g., runny nose, wheezing, chest pain)       Runny nose, head congestion, experiencing some decrease hearing.  Protocols used: Cough - Acute Non-Productive-A-AH  Message from Lauren C sent at  01/22/2024  5:07 PM EDT  Reason for Triage: Pt has had a cough for the past week, concerned about walking pneumonia. No phlegm, unproductive cough, but feels like a heavy cough. Cough medicine works a little bit but not for long.

## 2024-01-23 ENCOUNTER — Encounter: Payer: Self-pay | Admitting: Internal Medicine

## 2024-01-23 ENCOUNTER — Ambulatory Visit: Admitting: Internal Medicine

## 2024-01-23 VITALS — BP 130/80 | HR 80 | Temp 97.9°F | Ht 62.0 in | Wt 235.0 lb

## 2024-01-23 DIAGNOSIS — J22 Unspecified acute lower respiratory infection: Secondary | ICD-10-CM

## 2024-01-23 MED ORDER — BENZONATATE 100 MG PO CAPS: ORAL_CAPSULE | ORAL | 0 refills | Status: AC

## 2024-01-23 MED ORDER — CEFTRIAXONE SODIUM 1 G IJ SOLR
1.0000 g | Freq: Once | INTRAMUSCULAR | Status: AC
  Administered 2024-01-23: 1 g via INTRAMUSCULAR

## 2024-01-23 MED ORDER — METHYLPREDNISOLONE ACETATE 80 MG/ML IJ SUSP
80.0000 mg | Freq: Once | INTRAMUSCULAR | Status: AC
  Administered 2024-01-23: 80 mg via INTRAMUSCULAR

## 2024-01-23 MED ORDER — LEVOFLOXACIN 500 MG PO TABS: 500.0000 mg | ORAL_TABLET | Freq: Every day | ORAL | 0 refills | Status: AC

## 2024-01-23 NOTE — Patient Instructions (Signed)
 Patient will take Zithromax  Z-PAK 2 tabs day 1 followed by 1 tab days 2 through 5.  He will rest at home and stay well-hydrated.  He will call if symptoms worsen or have not improved in 5 to 7 days.  Today he received 1 g IM Rocephin in the office and 80 mg of Depo-Medrol  IM in the office.  He will complete a 10-day course of Levaquin  500 milligrams daily and may take generic Tessalon  Perles 100 mg up to 3 times daily as needed.

## 2024-01-23 NOTE — Telephone Encounter (Signed)
Left message for pt to call for appointment.

## 2024-01-23 NOTE — Telephone Encounter (Signed)
 We have openings here. Please schedule

## 2024-01-23 NOTE — Progress Notes (Signed)
 Patient Care Team: Joyce Norleen BROCKS, MD as PCP - General (Family Medicine)  Visit Date: 01/23/24  Subjective:    Patient ID: Chase Wright , Male   DOB: 10/11/1945, 78 y.o.    MRN: [id number]   78 y.o. Male presents today for cough.   He says that he's had a cough that started on Thursday October 9th. He took an at home Covid-19 and states it was negative. Says that at one point, he was sweating profusely but when he took his temperature , it was normal. He says he doesn't really have an appetite and his energy is low.  Says that at the moment, he is short of breath.  However, pulse ox is 97% on room air.  In 2020 he was seen at [facility] for a chronic cough he had for 5 years. Dr Joyce had previously  treated him with azithromycin  followed by a course of Levaquin , but symptoms did not improve. Chest Xray and CT chest demonstrated right middle lobe collapse. He underwent a bronchoscopy which revealed an endobronchial lesion in the right middle lobe. It was removed. Pathology returned negative for malignancy.   History of Hypertension treated with Zestoretic  10-12.5 mg daily. Blood pressure today is normal at 130/80.    Vaccine counseling: UTD on Influenza and Covid-19 vaccines.   Patient has a history of hypertension, hyperlipidemia history of gout, BPH, GERD.    Past Medical History:  Diagnosis Date   ASHD (arteriosclerotic heart disease)    Chronic kidney disease    RENAL STONES   Diverticulitis    GERD (gastroesophageal reflux disease)    Hx of colonic polyps    Hypertension    Obesity    PUD (peptic ulcer disease)    Renal insufficiency      Family History  Problem Relation Age of Onset   Diabetes Mother    Prostate cancer Father 45   Colon cancer Neg Hx    Rectal cancer Neg Hx    Stomach cancer Neg Hx     Social hx: He is a Engineer, agricultural.     Review of Systems  Constitutional:  Negative for chills and fever.  HENT:  Positive for  congestion. Negative for sore throat.   Respiratory:  Positive for cough and shortness of breath. Negative for sputum production.         Objective:   Vitals: BP 130/80   Pulse 80   Temp 97.9 F (36.6 C)   Ht 5' 2 (1.575 m)   Wt 235 lb (106.6 kg)   SpO2 97%   BMI 42.98 kg/m    Physical Exam Vitals and nursing note reviewed.  Constitutional:      General: He is not in acute distress.    Appearance: Normal appearance. He is not ill-appearing.  HENT:     Head: Normocephalic and atraumatic.     Right Ear: Tympanic membrane, ear canal and external ear normal.     Left Ear: Tympanic membrane, ear canal and external ear normal.     Mouth/Throat:     Mouth: Mucous membranes are moist.     Pharynx: Oropharynx is clear. Posterior oropharyngeal erythema present. No oropharyngeal exudate.  Pulmonary:     Effort: Pulmonary effort is normal.     Breath sounds: Normal breath sounds. No wheezing, rhonchi or rales.  Lymphadenopathy:     Cervical: No cervical adenopathy.  Skin:    General: Skin is warm and dry.  Neurological:     Mental Status: He is alert and oriented to person, place, and time. Mental status is at baseline.  Psychiatric:        Mood and Affect: Mood normal.        Behavior: Behavior normal.        Thought Content: Thought content normal.        Judgment: Judgment normal.       Results:     Labs:       Component Value Date/Time   NA 140 11/05/2023 1205   NA 140 04/17/2023 1540   K 4.1 11/05/2023 1205   CL 102 11/05/2023 1205   CO2 27 11/05/2023 1205   GLUCOSE 95 11/05/2023 1205   BUN 22 11/05/2023 1205   BUN 16 04/17/2023 1540   CREATININE 1.32 11/05/2023 1205   CREATININE 1.81 (H) 10/24/2016 1331   CALCIUM 9.6 11/05/2023 1205   PROT 6.5 04/17/2023 1540   ALBUMIN 4.3 04/17/2023 1540   AST 18 04/17/2023 1540   ALT 16 04/17/2023 1540   ALKPHOS 80 04/17/2023 1540   BILITOT 0.8 04/17/2023 1540   GFRNONAA 47 (L) 07/13/2020 0252   GFRAA 52 (L)  01/27/2020 1451     Lab Results  Component Value Date   WBC 9.6 04/17/2023   HGB 15.7 04/17/2023   HCT 45.6 04/17/2023   MCV 96 04/17/2023   PLT 264 04/17/2023    Lab Results  Component Value Date   CHOL 211 (H) 04/17/2023   HDL 34 (L) 04/17/2023   LDLCALC 142 (H) 04/17/2023   TRIG 195 (H) 04/17/2023   CHOLHDL 6.2 (H) 04/17/2023     Lab Results  Component Value Date   PSA1 2.3 04/17/2023   PSA1 2.6 03/25/2022   PSA1 3.1 01/27/2020   PSA 2.62 02/25/2012     Assessment & Plan:    Acute Lower Respiratory Infection: He says that he's had a cough since last week Thursday. He took an at home Covid-19 and it was negative. Says that at one point he was sweating profusely but when he took his temperature it was normal. He says he doesn't really have an appetite and his energy is low.  Says that at the moment he is short of breath.     Received Depo-medrol  80 mg IM injection today.    Received Rocephin 1 g IM injection today.   Levaquin  500 mg daily for seven days prescribed.    Benzonatate  100 mg up to three times daily as needed.   In 2020 he was seen at [facility] for a chronic cough he had for 5 years. Dr Joyce had previously  treated him with azithromycin  followed by a course of Levaquin  ,  however these meds  did not improve his symptoms. Chest Xray and CT chest demonstrated right middle lobe collapse. He underwent a bronchoscopy which revealed endobronchial lesion in the right middle lobe. It was removed. Pathology returned negative for malignancy.   Hypertension: treated with Zestoretic  10-12.5 mg daily. Blood pressure today is normal at 130/80.    Vaccine counseling: UTD on Influenza and Covid-19 vaccines.   Plan: Patient given Depo-Medrol  80 mg IM today.  He also was given Rocephin 1 g IM today.  He was started on Levaquin  500 milligrams daily for 7 days and Tessalon  Perles 100 mg to take 1 perle up to 3 times daily as needed for cough.  I,Makayla C  Reid,acting as a scribe for Ronal JINNY Hailstone, MD.,have documented all  relevant documentation on the behalf of Ronal JINNY Hailstone, MD,as directed by  Ronal JINNY Hailstone, MD while in the presence of Ronal JINNY Hailstone, MD.  I, Ronal JINNY Hailstone, MD, have reviewed all documentation for this visit. The documentation on 01/23/2024 for the exam, diagnosis, procedures, and orders are all accurate and complete.

## 2024-01-26 ENCOUNTER — Ambulatory Visit: Admitting: Family Medicine

## 2024-01-27 ENCOUNTER — Telehealth: Payer: Self-pay

## 2024-01-27 NOTE — Telephone Encounter (Signed)
 Copied from CRM #[id number]. Topic: General - Other >> Jan 27, 2024  2:08 PM Amy B wrote: Reason for CRM: Patient is requesting a call back from Angie.  Attempted transfer to [facility] but was told she was with a patient.   Returned call to Patient. 10 days ago he came down with a cold, head congestion, Rt ear felt full and can't hear, cough, started taking otc cold meds until Thursday, was still feel worse calling our number and got call center and was told we had no availability was scheduled with Dr. Perri, there he was given  2 shots and 2 prescriptions. He is very upset that he was told we did not have any openings, he would have rather came here. Patient wants to come in tomorrow because he is still under the weather.

## 2024-01-28 ENCOUNTER — Ambulatory Visit (INDEPENDENT_AMBULATORY_CARE_PROVIDER_SITE_OTHER): Admitting: Family Medicine

## 2024-01-28 ENCOUNTER — Ambulatory Visit
Admission: RE | Admit: 2024-01-28 | Discharge: 2024-01-28 | Disposition: A | Source: Ambulatory Visit | Attending: Family Medicine | Admitting: Family Medicine

## 2024-01-28 VITALS — BP 134/80 | HR 66 | Wt 230.0 lb

## 2024-01-28 DIAGNOSIS — R079 Chest pain, unspecified: Secondary | ICD-10-CM | POA: Diagnosis not present

## 2024-01-28 DIAGNOSIS — R059 Cough, unspecified: Secondary | ICD-10-CM | POA: Diagnosis not present

## 2024-01-28 DIAGNOSIS — R051 Acute cough: Secondary | ICD-10-CM

## 2024-01-28 NOTE — Telephone Encounter (Signed)
 I called pt to sched appt, reached voice mail, left message for him to call us  back and ask for me or Melissa and we will schedule him.

## 2024-01-28 NOTE — Patient Instructions (Signed)
 Take 2 tylenol  extra strength 3 tlimes a day stay on the antibiotic You can olso tae 2 Akeve twice a day for pain

## 2024-01-28 NOTE — Progress Notes (Signed)
   Subjective:    Patient ID: Chase Wright., male    DOB: January 03, 1946, 78 y.o.   MRN: [id number]  Discussed the use of AI scribe software for clinical note transcription with the patient, who gave verbal consent to proceed.  History of Present Illness   [name]. is a 78 year old male who presents with persistent chest discomfort and cough.  He has been experiencing persistent chest discomfort and cough despite receiving treatment. On October 17th, he received a steroid injection and Rocephin. He was prescribed levofloxacin , which he has been taking for four days out of a ten-day course, one pill per day. The chest pain is described as a 'fire in my chest trying to get out,' characterized by heat and burning, which was severe yesterday but slightly improved today. The pain worsens with coughing.  He denies any improvement in his condition since the visit on the 17th. He has been taking Tylenol , specifically the arthritis strength formula, a couple of times a day for the chest pain, but he does not find it very effective. He also mentions rhinorrhea similar to allergy season and a persistent cough.  Additionally, he reports a decline in hearing in his right ear over the past two weeks. He took a COVID test, which was negative. No sore throat or fever. He usually does not get colds and has not been exposed to anyone recently.           Review of Systems     Objective:    Physical Exam Alert and in no distress. Tympanic membranes and canals are normal. Pharyngeal area is normal. Neck is supple without adenopathy or thyromegaly. Cardiac exam shows a regular sinus rhythm without murmurs or gallops. Lungs are clear to auscultation.             Assessment & Plan:    Acute cough - Plan: DG Chest 2 View, CBC with Differential/Platelet, Comprehensive metabolic panel with GFR  Persistent cough and chest pain despite levofloxacin , steroids, and Rocephin. Burning chest pain  worsens with cough, indicating musculoskeletal origin. Lungs clear, negative COVID test. - Order chest x-ray to evaluate for pneumonia or other pulmonary issues. - Continue levofloxacin  as prescribed. - Increase Tylenol  Extra strenth to two tablets three times daily for chest pain. - Discontinue ineffective cough medicine, consider Mucinex for relief.

## 2024-01-29 ENCOUNTER — Ambulatory Visit: Payer: Self-pay | Admitting: Family Medicine

## 2024-01-29 DIAGNOSIS — R051 Acute cough: Secondary | ICD-10-CM

## 2024-01-29 LAB — COMPREHENSIVE METABOLIC PANEL WITH GFR
ALT: 7 IU/L (ref 0–44)
AST: <5 IU/L (ref 0–40)
Albumin: 4.5 g/dL (ref 3.8–4.8)
Alkaline Phosphatase: 79 IU/L (ref 47–123)
BUN/Creatinine Ratio: 10 (ref 10–24)
BUN: 20 mg/dL (ref 8–27)
Bilirubin Total: 0.7 mg/dL (ref 0.0–1.2)
CO2: 24 mmol/L (ref 20–29)
Calcium: 10 mg/dL (ref 8.6–10.2)
Chloride: 100 mmol/L (ref 96–106)
Creatinine, Ser: 1.94 mg/dL — AB (ref 0.76–1.27)
Globulin, Total: 2.9 g/dL (ref 1.5–4.5)
Glucose: 97 mg/dL (ref 70–99)
Potassium: 4.6 mmol/L (ref 3.5–5.2)
Sodium: 136 mmol/L (ref 134–144)
Total Protein: 7.4 g/dL (ref 6.0–8.5)
eGFR: 35 mL/min/1.73 — AB (ref >59)

## 2024-01-29 LAB — CBC WITH DIFFERENTIAL/PLATELET
Basophils Absolute: 0.1 x10E3/uL (ref 0.0–0.2)
Basos: 1 %
EOS (ABSOLUTE): 0.3 x10E3/uL (ref 0.0–0.4)
Eos: 3 %
Hematocrit: 48.6 % (ref 37.5–51.0)
Hemoglobin: 16.1 g/dL (ref 13.0–17.7)
Immature Grans (Abs): 0.1 x10E3/uL (ref 0.0–0.1)
Immature Granulocytes: 1 %
Lymphocytes Absolute: 2.5 x10E3/uL (ref 0.7–3.1)
Lymphs: 25 %
MCH: 32.5 pg (ref 26.6–33.0)
MCHC: 33.1 g/dL (ref 31.5–35.7)
MCV: 98 fL — ABNORMAL HIGH (ref 79–97)
Monocytes Absolute: 0.9 x10E3/uL (ref 0.1–0.9)
Monocytes: 9 %
Neutrophils Absolute: 6.3 x10E3/uL (ref 1.4–7.0)
Neutrophils: 61 %
Platelets: 340 x10E3/uL (ref 150–450)
RBC: 4.95 x10E6/uL (ref 4.14–5.80)
RDW: 12.8 % (ref 11.6–15.4)
WBC: 10.1 x10E3/uL (ref 3.4–10.8)

## 2024-02-02 MED ORDER — PREDNISONE 10 MG (48) PO TBPK: ORAL_TABLET | ORAL | 0 refills | Status: AC

## 2024-02-02 NOTE — Progress Notes (Signed)
 Called Patient to let him know, He will go pick up and let us  know if this helps.

## 2024-02-02 NOTE — Telephone Encounter (Signed)
 Patient called in and wanted to let Dr. Joyce know he was on his last antibiotic, not feeling better. Wants to know if he thinks another round of antibiotics would help. Coughing, Chest congestion and feeling bad over all.

## 2024-02-26 ENCOUNTER — Ambulatory Visit: Admitting: Internal Medicine

## 2024-03-23 ENCOUNTER — Ambulatory Visit: Payer: Medicare Other

## 2024-03-23 VITALS — BP 130/78 | HR 63 | Temp 98.2°F | Ht 66.0 in | Wt 233.8 lb

## 2024-03-23 DIAGNOSIS — Z0000 Encounter for general adult medical examination without abnormal findings: Secondary | ICD-10-CM

## 2024-03-23 NOTE — Progress Notes (Signed)
 Chief Complaint  Patient presents with   Medicare Wellness     Subjective:   Chase Wright. is a 78 y.o. male who presents for a Medicare Annual Wellness Visit.  Visit info / Clinical Intake: Medicare Wellness Visit Type:: Subsequent Annual Wellness Visit Persons participating in visit and providing information:: patient Medicare Wellness Visit Mode:: In-person (required for WTM) Interpreter Needed?: No Pre-visit prep was completed: yes AWV questionnaire completed by patient prior to visit?: no Living arrangements:: (!) lives alone Patient's Overall Health Status Rating: (!) fair Typical amount of pain: some (back pain) Does pain affect daily life?: (!) yes Are you currently prescribed opioids?: no  Dietary Habits and Nutritional Risks How many meals a day?: (!) 1 Eats fruit and vegetables daily?: (!) no Most meals are obtained by: preparing own meals In the last 2 weeks, have you had any of the following?: none Diabetic:: no  Functional Status Activities of Daily Living (to include ambulation/medication): Independent Ambulation: Independent with device- listed below Home Assistive Devices/Equipment: Cane Medication Administration: Independent Home Management (perform basic housework or laundry): Independent Manage your own finances?: yes Primary transportation is: facility / other Concerns about vision?: (!) yes (cataract in left eye, cloudiness) Concerns about hearing?: (!) yes (decreased hearing left ear, declines hearing aids) Uses hearing aids?: no  Fall Screening Falls in the past year?: 1 (trips) Number of falls in past year: 1 Was there an injury with Fall?: 0 Fall Risk Category Calculator: 2 Patient Fall Risk Level: Moderate Fall Risk  Fall Risk Patient at Risk for Falls Due to: History of fall(s); Impaired balance/gait; Impaired mobility; Medication side effect Fall risk Follow up: Falls evaluation completed; Falls prevention discussed  Home and  Transportation Safety: All rugs have non-skid backing?: yes All stairs or steps have railings?: N/A, no stairs Grab bars in the bathtub or shower?: yes Have non-skid surface in bathtub or shower?: yes Good home lighting?: yes Regular seat belt use?: yes Hospital stays in the last year:: no  Cognitive Assessment Difficulty concentrating, remembering, or making decisions? : no Will 6CIT or Mini Cog be Completed: yes What year is it?: 0 points What month is it?: 0 points Give patient an address phrase to remember (5 components): [address] About what time is it?: 0 points Count backwards from 20 to 1: 0 points Say the months of the year in reverse: 0 points Repeat the address phrase from earlier: 2 points 6 CIT Score: 2 points  Advance Directives (For Healthcare) Does Patient Have a Medical Advance Directive?: Yes Does patient want to make changes to medical advance directive?: No - Patient declined Type of Advance Directive: Healthcare Power of [place]; Living will Copy of Healthcare Power of Attorney in Chart?: No - copy requested Copy of Living Will in Chart?: No - copy requested  Reviewed/Updated  Reviewed/Updated: Reviewed All (Medical, Surgical, Family, Medications, Allergies, Care Teams, Patient Goals)    Allergies (verified) Icosapent  ethyl (epa ethyl ester) (fish), Penicillin g, Praluent  [alirocumab ], and Statins   Current Medications (verified) Outpatient Encounter Medications as of 03/23/2024  Medication Sig   acidophilus (RISAQUAD) CAPS capsule Take 1 capsule by mouth daily.   alfuzosin  (UROXATRAL ) 10 MG 24 hr tablet TAKE 1 TABLET(10 MG) BY MOUTH DAILY   allopurinol  (ZYLOPRIM ) 300 MG tablet TAKE 1 TABLET(300 MG) BY MOUTH DAILY   Artificial Tear Ointment (DRY EYES OP) Apply 1 drop to eye daily as needed (dry eyes).   famotidine  (PEPCID ) 20 MG tablet  Take 20 mg by mouth daily.    furosemide  (LASIX ) 20 MG tablet TAKE 1 TABLET BY MOUTH AS NEEDED FOR EDEMA.  TAKE WHEN SWELLING IN FEET   Iron-Vitamins (GERITOL COMPLETE PO) Take 1 tablet by mouth daily.   lisinopril -hydrochlorothiazide  (ZESTORETIC ) 10-12.5 MG tablet TAKE 1 TABLET BY MOUTH DAILY   B Complex-C (SUPER B COMPLEX PO) Take by mouth. (Patient not taking: Reported on 03/23/2024)   benzonatate  (TESSALON ) 100 MG capsule One cap by mouth up to 3 times daily as needed for cough (Patient not taking: Reported on 03/23/2024)   levofloxacin  (LEVAQUIN ) 500 MG tablet Take 1 tablet (500 mg total) by mouth daily. (Patient not taking: Reported on 03/23/2024)   predniSONE  (STERAPRED UNI-PAK 48 TAB) 10 MG (48) TBPK tablet Take as per manufacturer's recommendations (Patient not taking: Reported on 03/23/2024)   Sodium Sulfate-Mag Sulfate-KCl (SUTAB ) [phone] MG TABS Take 12 tablets by mouth as directed. (Patient not taking: Reported on 03/23/2024)   No facility-administered encounter medications on file as of 03/23/2024.    History: Past Medical History:  Diagnosis Date   ASHD (arteriosclerotic heart disease)    Chronic kidney disease    RENAL STONES   Diverticulitis    GERD (gastroesophageal reflux disease)    Hx of colonic polyps    Hypertension    Obesity    PUD (peptic ulcer disease)    Renal insufficiency    Past Surgical History:  Procedure Laterality Date   BYPASS GRAFT  08/16/1999   CARDIAC CATHETERIZATION  08/2005   NASAL ENDOSCOPY  2008   VIDEO BRONCHOSCOPY Bilateral 05/01/2018   Procedure: VIDEO BRONCHOSCOPY WITHOUT FLUORO;  Surgeon: Kassie Acquanetta Bradley, MD;  Location: [facility] ENDOSCOPY;  Service: Cardiopulmonary;  Laterality: Bilateral;   Family History  Problem Relation Age of Onset   Diabetes Mother    Prostate cancer Father 78   Colon cancer Neg Hx    Rectal cancer Neg Hx    Stomach cancer Neg Hx    Social History   Occupational History   Not on file  Tobacco Use   Smoking status: Never   Smokeless tobacco: Never  Vaping Use   Vaping status: Never Used  Substance and  Sexual Activity   Alcohol use: Not Currently    Comment: rare beer   Drug use: No   Sexual activity: Not Currently   Tobacco Counseling Counseling given: Not Answered  SDOH Screenings   Food Insecurity: No Food Insecurity (03/23/2024)  Housing: Unknown (03/23/2024)  Transportation Needs: No Transportation Needs (03/23/2024)  Utilities: Not At Risk (03/23/2024)  Alcohol Screen: Low Risk (03/23/2024)  Depression (PHQ2-9): Low Risk (03/23/2024)  Financial Resource Strain: Low Risk (03/23/2024)  Physical Activity: Inactive (03/23/2024)  Social Connections: Moderately Isolated (03/23/2024)  Stress: No Stress Concern Present (03/23/2024)  Tobacco Use: Low Risk (03/23/2024)  Health Literacy: Adequate Health Literacy (03/23/2024)   See flowsheets for full screening details  Depression Screen PHQ 2 & 9 Depression Scale- Over the past 2 weeks, how often have you been bothered by any of the following problems? Little interest or pleasure in doing things: 0 Feeling down, depressed, or hopeless (PHQ Adolescent also includes...irritable): 0 (moments) PHQ-2 Total Score: 0 Trouble falling or staying asleep, or sleeping too much: 3 Feeling tired or having little energy: 1 Poor appetite or overeating (PHQ Adolescent also includes...weight loss): 0 Feeling bad about yourself - or that you are a failure or have let yourself or your family down: 0 Trouble concentrating on things, such as reading the  newspaper or watching television [facility] Adolescent also includes...like school work): 0 Moving or speaking so slowly that other people could have noticed. Or the opposite - being so fidgety or restless that you have been moving around a lot more than usual: 0 Thoughts that you would be better off dead, or of hurting yourself in some way: 0 PHQ-9 Total Score: 4 If you checked off any problems, how difficult have these problems made it for you to do your work, take care of things at home, or get along with  other people?: Somewhat difficult  Depression Treatment Depression Interventions/Treatment : Patient refuses Treatment     Goals Addressed             This Visit's Progress    Patient Stated       03/23/2024, denies goals             Objective:    Today's Vitals   03/23/24 1128  BP: 130/78  Pulse: 63  Temp: 98.2 F (36.8 C)  TempSrc: Oral  SpO2: 97%  Weight: 233 lb 12.8 oz (106.1 kg)  Height: 5' 6 (1.676 m)   Body mass index is 37.74 kg/m.  Hearing/Vision screen Hearing Screening - Comments:: Decreased hearing in left ear, declines hearing aids Vision Screening - Comments:: Regular eye exam, [facility] Immunizations and Health Maintenance Health Maintenance  Topic Date Due   COVID-19 Vaccine (9 - 2025-26 season) 07/08/2024   Medicare Annual Wellness (AWV)  03/23/2025   DTaP/Tdap/Td (5 - Td or Tdap) 07/12/2030   Influenza Vaccine  Completed   Hepatitis C Screening  Completed   Zoster Vaccines- Shingrix  Completed   Meningococcal B Vaccine  Aged Out   Pneumococcal Vaccine: 50+ Years  Discontinued   Fecal DNA (Cologuard)  Discontinued        Assessment/Plan:  This is a routine wellness examination for Chase Wright.  Patient Care Team: Joyce Norleen BROCKS, MD as PCP - General (Family Medicine)  I have personally reviewed and noted the following in the patients chart:   Medical and social history Use of alcohol, tobacco or illicit drugs  Current medications and supplements including opioid prescriptions. Functional ability and status Nutritional status Physical activity Advanced directives List of other physicians Hospitalizations, surgeries, and ER visits in previous 12 months Vitals Screenings to include cognitive, depression, and falls Referrals and appointments  No orders of the defined types were placed in this encounter.  In addition, I have reviewed and discussed with patient certain preventive protocols, quality metrics, and best practice  recommendations. A written personalized care plan for preventive services as well as general preventive health recommendations were provided to patient.   Ardella FORBES Dawn, LPN   [id number]/83/7974   Return in 1 year (on 03/23/2025).  After Visit Summary: (In Person-Printed) AVS printed and given to the patient  Nurse Notes: No voiced or noted concerns at this time

## 2024-03-23 NOTE — Patient Instructions (Addendum)
 Chase Wright,  Thank you for taking the time for your Medicare Wellness Visit. I appreciate your continued commitment to your health goals. Please review the care plan we discussed, and feel free to reach out if I can assist you further.  Please note that Annual Wellness Visits do not include a physical exam. Some assessments may be limited, especially if the visit was conducted virtually. If needed, we may recommend an in-person follow-up with your provider.  Ongoing Care Seeing your primary care provider every 3 to 6 months helps us  monitor your health and provide consistent, personalized care.   Referrals If a referral was made during today's visit and you haven't received any updates within two weeks, please contact the referred provider directly to check on the status.  Recommended Screenings:  Health Maintenance  Topic Date Due   COVID-19 Vaccine (9 - 2025-26 season) 07/08/2024   Medicare Annual Wellness Visit  03/23/2025   DTaP/Tdap/Td vaccine (5 - Td or Tdap) 07/12/2030   Flu Shot  Completed   Hepatitis C Screening  Completed   Zoster (Shingles) Vaccine  Completed   Meningitis B Vaccine  Aged Out   Pneumococcal Vaccine for age over 65  Discontinued   Cologuard (Stool DNA test)  Discontinued       03/23/2024   11:41 AM  Advanced Directives  Does Patient Have a Medical Advance Directive? Yes  Type of Estate Agent of [place];Living will  Does patient want to make changes to medical advance directive? No - Patient declined  Copy of Healthcare Power of Attorney in Chart? No - copy requested    Vision: Annual vision screenings are recommended for early detection of glaucoma, cataracts, and diabetic retinopathy. These exams can also reveal signs of chronic conditions such as diabetes and high blood pressure.  Dental: Annual dental screenings help detect early signs of oral cancer, gum disease, and other conditions linked to overall health, including  heart disease and diabetes.  Please see the attached documents for additional preventive care recommendations.

## 2024-04-13 ENCOUNTER — Other Ambulatory Visit: Payer: Self-pay | Admitting: Family Medicine

## 2024-04-13 DIAGNOSIS — I10 Essential (primary) hypertension: Secondary | ICD-10-CM

## 2024-04-14 ENCOUNTER — Other Ambulatory Visit: Payer: Self-pay | Admitting: Family Medicine

## 2024-04-14 DIAGNOSIS — I10 Essential (primary) hypertension: Secondary | ICD-10-CM

## 2024-04-14 NOTE — Telephone Encounter (Signed)
 Pt is due for CPE this month. Please schedule

## 2024-04-15 NOTE — Telephone Encounter (Signed)
 Left message for pt to call and schedule physical with Dr Joyce this month
